# Patient Record
Sex: Male | Born: 1965
Health system: Southern US, Community
[De-identification: ages and names within clinical notes are randomized; demographics above are authoritative.]

## PROBLEM LIST (undated history)

## (undated) DIAGNOSIS — F419 Anxiety disorder, unspecified: Secondary | ICD-10-CM

## (undated) DIAGNOSIS — C61 Malignant neoplasm of prostate: Secondary | ICD-10-CM

## (undated) DIAGNOSIS — G35 Multiple sclerosis: Secondary | ICD-10-CM

## (undated) DIAGNOSIS — R7303 Prediabetes: Secondary | ICD-10-CM

## (undated) DIAGNOSIS — N320 Bladder-neck obstruction: Secondary | ICD-10-CM

## (undated) DIAGNOSIS — N390 Urinary tract infection, site not specified: Secondary | ICD-10-CM

## (undated) DIAGNOSIS — N179 Acute kidney failure, unspecified: Secondary | ICD-10-CM

## (undated) DIAGNOSIS — R972 Elevated prostate specific antigen [PSA]: Secondary | ICD-10-CM

## (undated) DIAGNOSIS — T191XXA Foreign body in bladder, initial encounter: Secondary | ICD-10-CM

## (undated) DIAGNOSIS — C9110 Chronic lymphocytic leukemia of B-cell type not having achieved remission: Secondary | ICD-10-CM

## (undated) HISTORY — DX: Multiple sclerosis: G35

## (undated) HISTORY — DX: Elevated prostate specific antigen (PSA): R97.20

## (undated) HISTORY — DX: Malignant neoplasm of prostate: C61

---

## 2000-01-07 HISTORY — PX: KNEE SURGERY: SHX244

## 2000-02-19 ENCOUNTER — Ambulatory Visit (HOSPITAL_COMMUNITY): Admission: RE | Admit: 2000-02-19 | Discharge: 2000-02-19 | Payer: Self-pay | Admitting: *Deleted

## 2007-04-11 ENCOUNTER — Emergency Department: Payer: Self-pay | Admitting: Emergency Medicine

## 2011-01-07 DIAGNOSIS — C61 Malignant neoplasm of prostate: Secondary | ICD-10-CM

## 2011-01-07 HISTORY — DX: Malignant neoplasm of prostate: C61

## 2016-06-26 DIAGNOSIS — M549 Dorsalgia, unspecified: Secondary | ICD-10-CM | POA: Diagnosis not present

## 2016-06-26 DIAGNOSIS — M545 Low back pain: Secondary | ICD-10-CM | POA: Diagnosis not present

## 2016-06-26 DIAGNOSIS — M47816 Spondylosis without myelopathy or radiculopathy, lumbar region: Secondary | ICD-10-CM | POA: Diagnosis not present

## 2016-06-26 DIAGNOSIS — M5136 Other intervertebral disc degeneration, lumbar region: Secondary | ICD-10-CM | POA: Diagnosis not present

## 2016-07-11 ENCOUNTER — Encounter: Payer: Self-pay | Admitting: Family Medicine

## 2016-07-11 ENCOUNTER — Ambulatory Visit (INDEPENDENT_AMBULATORY_CARE_PROVIDER_SITE_OTHER): Payer: Federal, State, Local not specified - PPO | Admitting: Family Medicine

## 2016-07-11 VITALS — BP 111/82 | HR 66 | Temp 98.4°F | Resp 16 | Ht 71.0 in | Wt 251.0 lb

## 2016-07-11 DIAGNOSIS — E782 Mixed hyperlipidemia: Secondary | ICD-10-CM | POA: Diagnosis not present

## 2016-07-11 DIAGNOSIS — R972 Elevated prostate specific antigen [PSA]: Secondary | ICD-10-CM

## 2016-07-11 DIAGNOSIS — G35 Multiple sclerosis: Secondary | ICD-10-CM

## 2016-07-11 DIAGNOSIS — Z1211 Encounter for screening for malignant neoplasm of colon: Secondary | ICD-10-CM | POA: Diagnosis not present

## 2016-07-11 DIAGNOSIS — R7309 Other abnormal glucose: Secondary | ICD-10-CM | POA: Diagnosis not present

## 2016-07-11 DIAGNOSIS — C61 Malignant neoplasm of prostate: Secondary | ICD-10-CM | POA: Diagnosis not present

## 2016-07-11 DIAGNOSIS — Z7689 Persons encountering health services in other specified circumstances: Secondary | ICD-10-CM | POA: Diagnosis not present

## 2016-07-11 LAB — LIPID PANEL
CHOL/HDL RATIO: 4.5 ratio (ref <5.0)
CHOLESTEROL: 232 mg/dL — AB (ref <200)
HDL: 52 mg/dL (ref >40)
LDL Cholesterol: 161 mg/dL — ABNORMAL HIGH (ref <100)
Triglycerides: 94 mg/dL (ref <150)
VLDL: 19 mg/dL (ref <30)

## 2016-07-11 LAB — COMPLETE METABOLIC PANEL WITH GFR
ALT: 21 U/L (ref 9–46)
AST: 17 U/L (ref 10–35)
Albumin: 4.4 g/dL (ref 3.6–5.1)
Alkaline Phosphatase: 70 U/L (ref 40–115)
BILIRUBIN TOTAL: 0.7 mg/dL (ref 0.2–1.2)
BUN: 11 mg/dL (ref 7–25)
CHLORIDE: 105 mmol/L (ref 98–110)
CO2: 24 mmol/L (ref 20–31)
Calcium: 9.3 mg/dL (ref 8.6–10.3)
Creat: 1.04 mg/dL (ref 0.70–1.33)
GFR, EST NON AFRICAN AMERICAN: 83 mL/min (ref >=60)
GFR, Est African American: >89 mL/min (ref >=60)
Glucose, Bld: 93 mg/dL (ref 65–99)
Potassium: 4.2 mmol/L (ref 3.5–5.3)
Sodium: 138 mmol/L (ref 135–146)
TOTAL PROTEIN: 7.2 g/dL (ref 6.1–8.1)

## 2016-07-11 NOTE — Assessment & Plan Note (Signed)
Currently asymptomatic with known low grade prostate cancer, lost to follow-up for past 2 years - Previously established with [facility] Dr [name], last visit 01/2014 - Prior PSA trend with elevated PSA and s/p biopsy dx in 2013  Plan: 1. Strongly recommend he return to his [facility]/Uro Dr [name] to resume prostate cancer monitoring and management - no new referral at this time since established, he is to call to arrange follow-up, may need MRI as requested of prostate, will defer repeat PSA today. If he needs new referral should call our office

## 2016-07-11 NOTE — Assessment & Plan Note (Signed)
Secondary to prostate cancer No repeat PSA today Return to [facility] Onc/Uro

## 2016-07-11 NOTE — Assessment & Plan Note (Signed)
Stable chronic problem, without any recent flare or any symptoms currently. Previously diagnosed in 2002 following visual impairment, confirmed by report from Neurology with MRI and Lumbar Puncture, previously established with [facility], lost to follow-up now >9 years No meds currently  Plan: 1. Referral to Neurology - [facility], to re-establish care for chronic MS, may need updated diagnostics and prior review, determine treatment plan if any at this time, do not have any available records to review 2. Follow-up PRN

## 2016-07-11 NOTE — Progress Notes (Signed)
Subjective:    Patient ID: Ralph [name], male    DOB: 06/26/1965, 51 y.o.   MRN: [id number]  Ralph [name] is a 51 y.o. male presenting on 07/11/2016 for Establish Care  Previously seen by Dr Luan Pulling here at [facility] last 2014. Did not have PCP in between. Here to re-establish.  HPI   Multiple Sclerosis (MS), unspecified type (unable to recall exact diagnosis) - Reports initial concerns dating back to 2002 with blurry vision as first symptoms, saw optometrist, and was referred to Neurology (established with [facility]), and he had MRI and Lumbar Puncture with CSF analysis, he was diagnosed with MS, treated with steroid shots and neurologist every year for 7-8 years at that time, had detailed motor skill testing. His neurologist moved to [place], and he has not re-established since, last visit >9 years ago. - He remains off medication for >9 years, and remains symptom free during that time, still has no new concerns at this time, but would like to resume routine check-up on MS. He would like referral to return to [place].  Prostate Cancer / Elevated PSA: - Reports back in 2013, had initial elevated PSA, he established with [facility], had prostate biopsy in 2013, with low grade carcinoma, then followed PSA. Last office visit with Onc was 01/2014, was advised to follow-up in 6 months and repeat MRI to determine if need another biopsy, he did not follow-up due to no particular reason - He wants to re-establish with [facility] - No family history of Prostate CA - Denies any new concerns with prostate today, no new symptoms  Additional PMH - reports prior abnormal glucose in past was told borderline elevated does not recall results, also was told had some abnormal cholesterol numbers in past as well, never on medications for these problems.  Health Maintenance: - Screening Colon Cancer: He had a diagnostic colonoscopy for GI problems in [place] >4-5 years ago, was  normal, did not have polyps, was told to return after >age 27 for screening. No known family history of colon cancer. Considering Cologuard screening.   Past Medical History:  Diagnosis Date  . Elevated PSA   . MS (multiple sclerosis) ([place])   . Prostate cancer ([place]) 2013   Past Surgical History:  Procedure Laterality Date  . KNEE SURGERY  2002   meniscus   Social History   Social History  . Marital status: Single    Spouse name: N/A  . Number of children: N/A  . Years of education: College   Occupational History  . [employer])    Social History Main Topics  . Smoking status: Never Smoker  . Smokeless tobacco: Never Used  . Alcohol use No     Comment: Former alcohol consumption  . Drug use: No  . Sexual activity: No   Other Topics Concern  . Not on file   Social History Narrative  . No narrative on file   Family History  Problem Relation Age of Onset  . Cancer Sister        breast CA    No current outpatient prescriptions on file prior to visit.   No current facility-administered medications on file prior to visit.     Review of Systems  Constitutional: Negative for activity change, appetite change, chills, diaphoresis, fatigue, fever and unexpected weight change.  HENT: Negative for congestion, hearing loss and sinus pressure.   Eyes: Negative for visual disturbance.  Respiratory: Negative for apnea, cough,  choking, chest tightness, shortness of breath and wheezing.   Cardiovascular: Negative for chest pain, palpitations and leg swelling.  Gastrointestinal: Negative for abdominal pain, anal bleeding, blood in stool, constipation, diarrhea, nausea and vomiting.  Endocrine: Negative for cold intolerance and polyuria.  Genitourinary: Negative for decreased urine volume, difficulty urinating, dysuria, frequency and hematuria.  Musculoskeletal: Negative for arthralgias, back pain, gait problem, myalgias and neck pain.  Skin: Negative for rash.    Allergic/Immunologic: Negative for environmental allergies.  Neurological: Negative for dizziness, weakness, light-headedness, numbness and headaches.  Hematological: Negative for adenopathy.  Psychiatric/Behavioral: Negative for behavioral problems, dysphoric mood and sleep disturbance. The patient is not nervous/anxious.    Per HPI unless specifically indicated above     Objective:    BP 111/82   Pulse 66   Temp 98.4 F (36.9 C) (Oral)   Resp 16   Ht 5\' 11"  (1.803 m)   Wt 251 lb (113.9 kg)   BMI 35.01 kg/m   Wt Readings from Last 3 Encounters:  07/11/16 251 lb (113.9 kg)    Physical Exam  Constitutional: He is oriented to person, place, and time. He appears well-developed and well-nourished. No distress.  Well-appearing, comfortable, cooperative  HENT:  Head: Normocephalic and atraumatic.  Mouth/Throat: Oropharynx is clear and moist.  Eyes: Conjunctivae and EOM are normal. Pupils are equal, round, and reactive to light. Right eye exhibits no discharge. Left eye exhibits no discharge.  Neck: Normal range of motion. Neck supple. No thyromegaly present.  Cardiovascular: Normal rate, regular rhythm, normal heart sounds and intact distal pulses.   No murmur heard. Pulmonary/Chest: Effort normal and breath sounds normal. No respiratory distress. He has no wheezes. He has no rales.  Abdominal: Soft. Bowel sounds are normal. He exhibits no distension and no mass. There is no tenderness.  Genitourinary:  Genitourinary Comments: Deferred DRE today  Musculoskeletal: Normal range of motion. He exhibits no edema or tenderness.  Upper / Lower Extremities: - Normal muscle tone, strength bilateral upper extremities 5/5, lower extremities 5/5  Lymphadenopathy:    He has no cervical adenopathy.  Neurological: He is alert and oriented to person, place, and time.  Distal sensation intact to light touch all extremities  Skin: Skin is warm and dry. No rash noted. He is not diaphoretic. No  erythema.  Psychiatric: He has a normal mood and affect. His behavior is normal.  Well groomed, good eye contact, normal speech and thoughts  Nursing note and vitals reviewed.  No results found for this or any previous visit.    Assessment & Plan:   Problem List Items Addressed This Visit    Screening for colon cancer    Due for routine colon cancer screening. Prior diagnostic colonoscopy approx age 81 (>6 years ago) due to GI symptoms, reportedly no polyps, was performed locally, and advised return >age 15 for screening - No family history colon cancer. - Discussion today about recommendations for either Colonoscopy or Cologuard screening, benefits and risks of screening, interested in Cologuard, understands that if positive then recommendation is for diagnostic colonoscopy to follow-up.  - Patient advised to contact insurance first to learn cost, will notify us when ready for [place] to order Cologuard      Prostate cancer [facility]) - Primary    Currently asymptomatic with known low grade prostate cancer, lost to follow-up for past 2 years - Previously established with [facility] Dr [name], last visit 01/2014 - Prior PSA trend with elevated PSA and s/p biopsy dx  in 2013  Plan: 1. Strongly recommend he return to his [facility] Onc/Uro Dr [name] to resume prostate cancer monitoring and management - no new referral at this time since established, he is to call to arrange follow-up, may need MRI as requested of prostate, will defer repeat PSA today. If he needs new referral should call our office      Relevant Orders   CBC with Differential/Platelet   MS (multiple sclerosis) ([place])    Stable chronic problem, without any recent flare or any symptoms currently. Previously diagnosed in 2002 following visual impairment, confirmed by report from Neurology with MRI and Lumbar Puncture, previously established with [facility], lost to follow-up now >9 years No meds currently  Plan: 1. Referral to  Neurology - [facility], to re-establish care for chronic MS, may need updated diagnostics and prior review, determine treatment plan if any at this time, do not have any available records to review 2. Follow-up PRN      Relevant Orders   Ambulatory referral to Neurology   COMPLETE METABOLIC PANEL WITH GFR   CBC with Differential/Platelet   Elevated PSA    Secondary to prostate cancer No repeat PSA today Return to [facility]       Other Visit Diagnoses    Encounter to establish care with new doctor       Relevant Orders   CBC with Differential/Platelet   Abnormal glucose       Will check chemistry and A1c for routine monitoring, follow-up as appropriate, sooner if A1c elevated, discuss results   Relevant Orders   Hemoglobin A1c   Mixed hyperlipidemia       Will order fasting labs today, check lipids, no results for comparison, discuss results as appropriate   Relevant Orders   Lipid panel      No orders of the defined types were placed in this encounter.   Follow up plan: Return in about 1 year (around 07/11/2017) for Annual Physical.  Nobie Putnam, DO [facility] 07/11/2016, 12:48 PM

## 2016-07-11 NOTE — Patient Instructions (Addendum)
Thank you for coming to the clinic today.  1.  Please contact [facility] / Urology - Dr Zoe Lan office to see if you can re-schedule apt or if you require a NEW patient referral, call us for referral order.  Last appointment 01/09/2014 - was advised to do 6 month follow-up and MRI  Other option is we can refer you locally to [facility] and start over, would need records released from [place]  [place], [place] [address]  [phone]  [facility], [place]  [facility]  [id number]  [place], [place] [address]  [phone]  [phone] (Fax)     2. If you don't hear back in 2 weeks, call them to check status  [facility]   Address: [address], [place], [place] [address] Hours: 8AM-5PM Phone: [phone]  Colon Cancer Screening: - For all adults age 71+ routine colon cancer screening is highly recommended.     - Recent guidelines from [employer] recommend starting age of 68 - Early detection of colon cancer is important, because often there are no warning signs or symptoms, also if found early usually it can be cured. Late stage is hard to treat.  - If you are not interested in Colonoscopy screening (if done and normal you could be cleared for 5 to 10 years until next due), then Cologuard is an excellent alternative for screening test for Colon Cancer. It is highly sensitive for detecting DNA of colon cancer from even the earliest stages. Also, there is NO bowel prep required. - If Cologuard is NEGATIVE, then it is good for 3 years before next due - If Cologuard is POSITIVE, then it is strongly advised to get a Colonoscopy, which allows the GI doctor to locate the source of the cancer or polyp (even very early stage) and treat it by removing it. ------------------------- If you would like to proceed with  Cologuard (stool DNA test) - FIRST, call your insurance company and tell them you want to check cost of Cologuard tell them CPT Code [phone] (it may be completely covered and you could get for no cost, OR max cost without any coverage is about $600). Also, keep in mind if you do NOT open the kit, and decide not to do the test, you will NOT be charged, you should contact the company if you decide not to do the test. - If you want to proceed, you can notify us (phone message, [place], or at next visit) and we will order it for you. The test kit will be delivered to you house within about 1 week. Follow instructions to collect sample, you may call the company for any help or questions, 24/7 telephone support at [phone].   Please schedule a Follow-up Appointment to: Return in about 1 year (around 07/11/2017) for Annual Physical.  If you have any other questions or concerns, please feel free to call the clinic or send a message through [place]. You may also schedule an earlier appointment if necessary.  Additionally, you may be receiving a survey about your experience at our clinic within a few days to 1 week by e-mail or mail. We value your feedback.  Nobie Putnam, DO [place]

## 2016-07-11 NOTE — Assessment & Plan Note (Signed)
Due for routine colon cancer screening. Prior diagnostic colonoscopy approx age 51 (>6 years ago) due to GI symptoms, reportedly no polyps, was performed locally, and advised return >age 95 for screening - No family history colon cancer. - Discussion today about recommendations for either Colonoscopy or Cologuard screening, benefits and risks of screening, interested in Cologuard, understands that if positive then recommendation is for diagnostic colonoscopy to follow-up.  - Patient advised to contact insurance first to learn cost, will notify us when ready for [place] to order Cologuard

## 2016-07-12 LAB — CBC WITH DIFFERENTIAL/PLATELET
Basophils Absolute: 0 {cells}/uL (ref 0–200)
Basophils Relative: 0 %
Eosinophils Absolute: 156 {cells}/uL (ref 15–500)
Eosinophils Relative: 1 %
HCT: 43.2 % (ref 38.5–50.0)
Hemoglobin: 14.3 g/dL (ref 13.2–17.1)
Lymphocytes Relative: 75 %
Lymphs Abs: 11700 {cells}/uL — ABNORMAL HIGH (ref 850–3900)
MCH: 28.3 pg (ref 27.0–33.0)
MCHC: 33.1 g/dL (ref 32.0–36.0)
MCV: 85.4 fL (ref 80.0–100.0)
MPV: 9.8 fL (ref 7.5–12.5)
Monocytes Absolute: 468 {cells}/uL (ref 200–950)
Monocytes Relative: 3 %
Neutro Abs: 3276 {cells}/uL (ref 1500–7800)
Neutrophils Relative %: 21 %
Platelets: 244 K/uL (ref 140–400)
RBC: 5.06 MIL/uL (ref 4.20–5.80)
RDW: 14.8 % (ref 11.0–15.0)
WBC: 15.6 K/uL — ABNORMAL HIGH (ref 3.8–10.8)

## 2016-07-12 LAB — HEMOGLOBIN A1C
Hgb A1c MFr Bld: 5.8 % — ABNORMAL HIGH (ref <5.7)
Mean Plasma Glucose: 120 mg/dL

## 2016-07-14 LAB — PATHOLOGIST SMEAR REVIEW

## 2016-07-15 ENCOUNTER — Other Ambulatory Visit: Payer: Self-pay | Admitting: Family Medicine

## 2016-07-15 DIAGNOSIS — E785 Hyperlipidemia, unspecified: Secondary | ICD-10-CM | POA: Insufficient documentation

## 2016-07-15 DIAGNOSIS — G35 Multiple sclerosis: Secondary | ICD-10-CM

## 2016-07-15 DIAGNOSIS — Z0000 Encounter for general adult medical examination without abnormal findings: Secondary | ICD-10-CM

## 2016-07-15 DIAGNOSIS — D72820 Lymphocytosis (symptomatic): Secondary | ICD-10-CM | POA: Insufficient documentation

## 2016-07-15 DIAGNOSIS — R7303 Prediabetes: Secondary | ICD-10-CM | POA: Insufficient documentation

## 2016-07-15 DIAGNOSIS — E7800 Pure hypercholesterolemia, unspecified: Secondary | ICD-10-CM

## 2016-07-16 ENCOUNTER — Telehealth: Payer: Self-pay | Admitting: Family Medicine

## 2016-07-16 DIAGNOSIS — E7800 Pure hypercholesterolemia, unspecified: Secondary | ICD-10-CM

## 2016-07-16 MED ORDER — ROSUVASTATIN CALCIUM 5 MG PO TABS: 5.0000 mg | ORAL_TABLET | Freq: Every day | ORAL | 5 refills | Status: DC

## 2016-07-16 NOTE — Telephone Encounter (Signed)
I contacted patient today 07/16/16 to review lab results, spoke with patient briefly on mobile # and reviewed most of abnormal lab results.  1. Chemistry - Normal results, including electrolytes, kidney and liver function. Blood sugar normal.  2. Hemoglobin A1c 5.8 - abnormal, consistent with new Pre-Diabetes - recommended lifestyle improvements, follow-up in 6 months to re-check A1c  3. Cholesterol - Abnormal cholesterol results. Elevated 161 LDL (bad cholesterol). Previously on statin, intolerance - Recommend restart low dose statin, intermittent dosing rosuvastatin x 1 weekly then increase gradually to twice week, then 3 times weekly, goal for every other day if tolerated, then can consider daily - prefer night time dosing, new rx sent  4. CBC Blood Counts - No quantifiable anemia. Normal Hemoglobin. Abnormal WBC 15.6, abnormal absolute lymph count 11,700 - path smear review showed atypical lymphocytes, suggested immunophenotyping by flow cytometry, and concern for RBCs to be microcytic and hypochromic, consider iron deficiency eval. - Discussed this result with patient, do not have prior CBC to compare to, concern that needs further evaluation and repeat testing as recommended, and iron studies. Given patient already established with [facility] through Urology for monitoring Prostate Cancer, I contacted Dr [name] and spoke with him today 07/16/16, he agrees for helping connect patient with [facility], and they will reach out to scheduling to arrange him as new patient, awaiting to hear back on potential apt or scheduling at this time. Patient aware of this and that he needs further testing.  Follow-up with me in 6 months for Pre-Diabetes, A1c, and discuss statin / cholesterol. Keep apt scheduled for 07/2017 for Annual Physical.  Nobie Putnam, DO [facility] 07/16/2016, 5:30 PM

## 2016-07-30 ENCOUNTER — Telehealth: Payer: Self-pay | Admitting: Family Medicine

## 2016-07-30 NOTE — Telephone Encounter (Signed)
Reference to previous abnormal CBC lab result, copied below is text from our last conversation 7/11 "CBC Blood Counts - No quantifiable anemia. Normal Hemoglobin. Abnormal WBC 15.6, abnormal absolute lymph count 11,700 - path smear review showed atypical lymphocytes, suggested immunophenotyping by flow cytometry, and concern for RBCs to be microcytic and hypochromic, consider iron deficiency eval. - Discussed this result with patient, do not have prior CBC to compare to, concern that needs further evaluation and repeat testing as recommended, and iron studies. Given patient already established with [facility] through Urology for monitoring Prostate Cancer, I contacted Dr [name] and spoke with him today 07/16/16, he agrees for helping connect patient with [facility], and they will reach out to scheduling to arrange him as new patient, awaiting to hear back on potential apt or scheduling at this time. Patient aware of this and that he needs further testing."  I called patient today to check status of this over 2 weeks later. He states that [facility] never called him back to arrange or schedule. He has contacted Dr [name], no further updates plans to follow-up. Also he has apt with [facility] Dr Jannifer Franklin for MS.  I advised him that he may be able to contact [facility] Heme[facility]On[facility] himself, since he is already connected to their office through Urology for prostate cancer, and they should have been working on the referral already.  He will call today or tomorrow to check status. If he does need a new referral he will let me know.  [facility]  [place], [place] [address]  [place], [facility] 07/30/2016, 11:59 AM

## 2016-08-29 DIAGNOSIS — Z6835 Body mass index (BMI) 35.0-35.9, adult: Secondary | ICD-10-CM | POA: Diagnosis not present

## 2016-08-29 DIAGNOSIS — C61 Malignant neoplasm of prostate: Secondary | ICD-10-CM | POA: Diagnosis not present

## 2016-09-09 ENCOUNTER — Encounter: Payer: Self-pay | Admitting: Neurology

## 2016-09-09 ENCOUNTER — Ambulatory Visit (INDEPENDENT_AMBULATORY_CARE_PROVIDER_SITE_OTHER): Payer: Federal, State, Local not specified - PPO | Admitting: Neurology

## 2016-09-09 VITALS — BP 164/93 | HR 108 | Ht 71.0 in | Wt 250.5 lb

## 2016-09-09 DIAGNOSIS — G35 Multiple sclerosis: Secondary | ICD-10-CM | POA: Diagnosis not present

## 2016-09-09 NOTE — Patient Instructions (Signed)
   We will check MRI of the brain and cervical spine. 

## 2016-09-09 NOTE — Progress Notes (Signed)
Reason for visit: Multiple sclerosis  Referring physician: Dr. Meredith Pel [name] is a 51 y.o. male  History of present illness:  Ralph [name] is a 51 year old right-handed gentleman with a history of multiple sclerosis that apparently was diagnosed in 2002, the patient initially was followed by Dr. Rosiland Oz. The patient was placed on what he believes was Betaseron, he took the medication for several years, but he has been off of all disease modifying agents for 9 or 10 years. Initial symptoms included a visual change, the patient did undergo MRI of the brain and a lumbar puncture that helped confirm the diagnosis. Since the initial MS attack, the patient has not had any new symptoms of numbness, weakness, vision changes, balance changes, or difficulty controlling the bowels or the bladder. He does have a history of prostate cancer. He was recent seen by his primary care physician, he was referred to this office for follow-up for the multiple sclerosis.  Past Medical History:  Diagnosis Date  . Elevated PSA   . MS (multiple sclerosis) ([place])   . Prostate cancer ([place]) 2013    Past Surgical History:  Procedure Laterality Date  . KNEE SURGERY  2002   meniscus    Family History  Problem Relation Age of Onset  . Cancer Sister        breast CA     Social history:  reports that he has never smoked. He has never used smokeless tobacco. He reports that he does not drink alcohol or use drugs.  Medications:  Prior to Admission medications   Medication Sig Start Date End Date Taking? Authorizing Provider  rosuvastatin (CRESTOR) 5 MG tablet Take 1 tablet (5 mg total) by mouth at bedtime. Start with 1 pill weekly, gradual increase to 1 every other day, then nightly 07/16/16  Yes Karamalegos, Devonne Doughty, DO     No Known Allergies  ROS:  Out of a complete 14 system review of symptoms, the patient complains only of the following symptoms, and all other reviewed systems are  negative.  Ringing in the ears  Blood pressure (!) 164/93, pulse (!) 108, height 5\' 11"  (1.803 m), weight 250 lb 8 oz (113.6 kg).  Physical Exam  General: The patient is alert and cooperative at the time of the examination.  Eyes: Pupils are equal, round, and reactive to light. Discs are flat bilaterally.  Neck: The neck is supple, no carotid bruits are noted.  Respiratory: The respiratory examination is clear.  Cardiovascular: The cardiovascular examination reveals a regular rate and rhythm, no obvious murmurs or rubs are noted.  Skin: Extremities are without significant edema.  Neurologic Exam  Mental status: The patient is alert and oriented x 3 at the time of the examination. The patient has apparent normal recent and remote memory, with an apparently normal attention span and concentration ability.  Cranial nerves: Facial symmetry is present. There is good sensation of the face to pinprick and soft touch bilaterally. The strength of the facial muscles and the muscles to head turning and shoulder shrug are normal bilaterally. Speech is well enunciated, no aphasia or dysarthria is noted. Extraocular movements are full. Visual fields are full. The tongue is midline, and the patient has symmetric elevation of the soft palate. No obvious hearing deficits are noted.  Motor: The motor testing reveals 5 over 5 strength of all 4 extremities. Good symmetric motor tone is noted throughout.  Sensory: Sensory testing is intact to pinprick, soft touch, vibration sensation,  and position sense on all 4 extremities. No evidence of extinction is noted.  Coordination: Cerebellar testing reveals good finger-nose-finger and heel-to-shin bilaterally.  Gait and station: Gait is normal. Tandem gait is normal. Romberg is negative. No drift is seen.  Reflexes: Deep tendon reflexes are symmetric and normal bilaterally. Toes are downgoing bilaterally.   Assessment/Plan:  1. History of multiple  sclerosis  The patient will be set up for MRI of the brain and cervical spine with and without gadolinium enhancement. He has had recent blood work done confirming normal renal function. He will follow-up in 6 months, if he continues to do well and the MRI shows a low plaque burden, the patient may remain off of disease modifying agents for now. We will check MRI evaluations intermittently.  Jill Alexanders MD 09/09/2016 3:13 PM  [facility] [address] [place], [place] [address]  Phone [phone] Fax [phone]

## 2016-09-11 DIAGNOSIS — N400 Enlarged prostate without lower urinary tract symptoms: Secondary | ICD-10-CM | POA: Diagnosis not present

## 2016-09-11 DIAGNOSIS — C61 Malignant neoplasm of prostate: Secondary | ICD-10-CM | POA: Diagnosis not present

## 2016-09-11 DIAGNOSIS — R9721 Rising PSA following treatment for malignant neoplasm of prostate: Secondary | ICD-10-CM | POA: Diagnosis not present

## 2017-01-29 ENCOUNTER — Ambulatory Visit (INDEPENDENT_AMBULATORY_CARE_PROVIDER_SITE_OTHER): Payer: Federal, State, Local not specified - PPO

## 2017-01-29 DIAGNOSIS — Z23 Encounter for immunization: Secondary | ICD-10-CM

## 2017-02-20 DIAGNOSIS — R9721 Rising PSA following treatment for malignant neoplasm of prostate: Secondary | ICD-10-CM | POA: Diagnosis not present

## 2017-02-20 DIAGNOSIS — C61 Malignant neoplasm of prostate: Secondary | ICD-10-CM | POA: Diagnosis not present

## 2017-02-20 DIAGNOSIS — R972 Elevated prostate specific antigen [PSA]: Secondary | ICD-10-CM | POA: Diagnosis not present

## 2017-03-09 ENCOUNTER — Ambulatory Visit: Payer: Federal, State, Local not specified - PPO | Admitting: Nurse Practitioner

## 2017-06-30 ENCOUNTER — Ambulatory Visit (INDEPENDENT_AMBULATORY_CARE_PROVIDER_SITE_OTHER): Payer: Federal, State, Local not specified - PPO

## 2017-06-30 DIAGNOSIS — Z23 Encounter for immunization: Secondary | ICD-10-CM

## 2017-07-15 ENCOUNTER — Other Ambulatory Visit: Payer: PRIVATE HEALTH INSURANCE

## 2017-07-21 ENCOUNTER — Encounter: Payer: PRIVATE HEALTH INSURANCE | Admitting: Family Medicine

## 2018-01-13 DIAGNOSIS — M545 Low back pain: Secondary | ICD-10-CM | POA: Diagnosis not present

## 2018-01-13 DIAGNOSIS — M47816 Spondylosis without myelopathy or radiculopathy, lumbar region: Secondary | ICD-10-CM | POA: Diagnosis not present

## 2019-09-09 DIAGNOSIS — K080 Exfoliation of teeth due to systemic causes: Secondary | ICD-10-CM | POA: Diagnosis not present

## 2021-01-21 ENCOUNTER — Inpatient Hospital Stay
Admission: EM | Admit: 2021-01-21 | Discharge: 2021-01-24 | DRG: 683 | Disposition: A | Discharge status: Home / Self Care | Payer: Federal, State, Local not specified - PPO | Attending: Internal Medicine | Admitting: Internal Medicine

## 2021-01-21 ENCOUNTER — Emergency Department: Payer: Federal, State, Local not specified - PPO

## 2021-01-21 ENCOUNTER — Other Ambulatory Visit: Payer: Self-pay

## 2021-01-21 DIAGNOSIS — N136 Pyonephrosis: Secondary | ICD-10-CM | POA: Diagnosis present

## 2021-01-21 DIAGNOSIS — N320 Bladder-neck obstruction: Secondary | ICD-10-CM | POA: Diagnosis present

## 2021-01-21 DIAGNOSIS — R972 Elevated prostate specific antigen [PSA]: Secondary | ICD-10-CM | POA: Diagnosis not present

## 2021-01-21 DIAGNOSIS — E7800 Pure hypercholesterolemia, unspecified: Secondary | ICD-10-CM

## 2021-01-21 DIAGNOSIS — R319 Hematuria, unspecified: Secondary | ICD-10-CM | POA: Diagnosis present

## 2021-01-21 DIAGNOSIS — R350 Frequency of micturition: Secondary | ICD-10-CM | POA: Diagnosis present

## 2021-01-21 DIAGNOSIS — C61 Malignant neoplasm of prostate: Secondary | ICD-10-CM | POA: Diagnosis present

## 2021-01-21 DIAGNOSIS — Z23 Encounter for immunization: Secondary | ICD-10-CM | POA: Diagnosis not present

## 2021-01-21 DIAGNOSIS — R339 Retention of urine, unspecified: Secondary | ICD-10-CM | POA: Diagnosis not present

## 2021-01-21 DIAGNOSIS — E785 Hyperlipidemia, unspecified: Secondary | ICD-10-CM | POA: Diagnosis present

## 2021-01-21 DIAGNOSIS — Z803 Family history of malignant neoplasm of breast: Secondary | ICD-10-CM

## 2021-01-21 DIAGNOSIS — Z20822 Contact with and (suspected) exposure to covid-19: Secondary | ICD-10-CM | POA: Diagnosis present

## 2021-01-21 DIAGNOSIS — G35 Multiple sclerosis: Secondary | ICD-10-CM | POA: Diagnosis present

## 2021-01-21 DIAGNOSIS — D72820 Lymphocytosis (symptomatic): Secondary | ICD-10-CM | POA: Diagnosis present

## 2021-01-21 DIAGNOSIS — R7303 Prediabetes: Secondary | ICD-10-CM | POA: Diagnosis present

## 2021-01-21 DIAGNOSIS — D72829 Elevated white blood cell count, unspecified: Secondary | ICD-10-CM | POA: Diagnosis present

## 2021-01-21 DIAGNOSIS — N139 Obstructive and reflux uropathy, unspecified: Secondary | ICD-10-CM

## 2021-01-21 DIAGNOSIS — N1330 Unspecified hydronephrosis: Secondary | ICD-10-CM | POA: Diagnosis not present

## 2021-01-21 DIAGNOSIS — N401 Enlarged prostate with lower urinary tract symptoms: Secondary | ICD-10-CM | POA: Diagnosis present

## 2021-01-21 DIAGNOSIS — N138 Other obstructive and reflux uropathy: Secondary | ICD-10-CM | POA: Diagnosis present

## 2021-01-21 DIAGNOSIS — N179 Acute kidney failure, unspecified: Principal | ICD-10-CM

## 2021-01-21 DIAGNOSIS — R338 Other retention of urine: Secondary | ICD-10-CM | POA: Diagnosis present

## 2021-01-21 LAB — CBG MONITORING, ED: Glucose-Capillary: 77 mg/dL (ref 70–99)

## 2021-01-21 LAB — URINALYSIS, ROUTINE W REFLEX MICROSCOPIC
Bacteria, UA: NONE SEEN
Bilirubin Urine: NEGATIVE
Glucose, UA: NEGATIVE mg/dL
Ketones, ur: NEGATIVE mg/dL
Leukocytes,Ua: NEGATIVE
Nitrite: NEGATIVE
Protein, ur: NEGATIVE mg/dL
Specific Gravity, Urine: 1.01 (ref 1.005–1.030)
Squamous Epithelial / LPF: NONE SEEN (ref 0–5)
pH: 5.5 (ref 5.0–8.0)

## 2021-01-21 LAB — BASIC METABOLIC PANEL
Anion gap: 11 (ref 5–15)
BUN: 43 mg/dL — ABNORMAL HIGH (ref 6–20)
CO2: 25 mmol/L (ref 22–32)
Calcium: 9.3 mg/dL (ref 8.9–10.3)
Chloride: 105 mmol/L (ref 98–111)
Creatinine, Ser: 5.72 mg/dL — ABNORMAL HIGH (ref 0.61–1.24)
GFR, Estimated: 11 mL/min — ABNORMAL LOW (ref >60)
Glucose, Bld: 86 mg/dL (ref 70–99)
Potassium: 4.7 mmol/L (ref 3.5–5.1)
Sodium: 141 mmol/L (ref 135–145)

## 2021-01-21 LAB — RESP PANEL BY RT-PCR (FLU A&B, COVID) ARPGX2
Influenza A by PCR: NEGATIVE
Influenza B by PCR: NEGATIVE
SARS Coronavirus 2 by RT PCR: NEGATIVE

## 2021-01-21 LAB — CBC
HCT: 38.8 % — ABNORMAL LOW (ref 39.0–52.0)
Hemoglobin: 12.7 g/dL — ABNORMAL LOW (ref 13.0–17.0)
MCH: 28.2 pg (ref 26.0–34.0)
MCHC: 32.7 g/dL (ref 30.0–36.0)
MCV: 86 fL (ref 80.0–100.0)
Platelets: 248 10*3/uL (ref 150–400)
RBC: 4.51 MIL/uL (ref 4.22–5.81)
RDW: 14.2 % (ref 11.5–15.5)
WBC: 29.5 10*3/uL — ABNORMAL HIGH (ref 4.0–10.5)
nRBC: 0 % (ref 0.0–0.2)

## 2021-01-21 MED ORDER — HEPARIN SODIUM (PORCINE) 5000 UNIT/ML IJ SOLN
5000.0000 [IU] | Freq: Three times a day (TID) | INTRAMUSCULAR | Status: DC
  Administered 2021-01-21 – 2021-01-24 (×8): 5000 [IU] via SUBCUTANEOUS
  Filled 2021-01-21 (×8): qty 1

## 2021-01-21 MED ORDER — ONDANSETRON HCL 4 MG/2ML IJ SOLN: 4.0000 mg | Freq: Four times a day (QID) | INTRAMUSCULAR | Status: DC | PRN

## 2021-01-21 MED ORDER — SENNOSIDES-DOCUSATE SODIUM 8.6-50 MG PO TABS: 1.0000 | ORAL_TABLET | Freq: Every evening | ORAL | Status: DC | PRN

## 2021-01-21 MED ORDER — TAMSULOSIN HCL 0.4 MG PO CAPS
0.4000 mg | ORAL_CAPSULE | Freq: Every day | ORAL | Status: DC
  Administered 2021-01-21 – 2021-01-24 (×4): 0.4 mg via ORAL
  Filled 2021-01-21 (×4): qty 1

## 2021-01-21 MED ORDER — INFLUENZA VAC SPLIT QUAD 0.5 ML IM SUSY
0.5000 mL | PREFILLED_SYRINGE | INTRAMUSCULAR | Status: AC
  Administered 2021-01-22: 11:00:00 0.5 mL via INTRAMUSCULAR
  Filled 2021-01-21: qty 0.5

## 2021-01-21 MED ORDER — ONDANSETRON HCL 4 MG PO TABS: 4.0000 mg | ORAL_TABLET | Freq: Four times a day (QID) | ORAL | Status: DC | PRN

## 2021-01-21 MED ORDER — FINASTERIDE 5 MG PO TABS
5.0000 mg | ORAL_TABLET | Freq: Every day | ORAL | Status: DC
  Administered 2021-01-22 – 2021-01-24 (×3): 5 mg via ORAL
  Filled 2021-01-21 (×3): qty 1

## 2021-01-21 MED ORDER — SODIUM CHLORIDE 0.9 % IV BOLUS
1000.0000 mL | Freq: Once | INTRAVENOUS | Status: AC
Start: 2021-01-21 — End: 2021-01-21
  Administered 2021-01-21: 1000 mL via INTRAVENOUS

## 2021-01-21 MED ORDER — BISACODYL 5 MG PO TBEC: 5.0000 mg | DELAYED_RELEASE_TABLET | Freq: Every day | ORAL | Status: DC | PRN

## 2021-01-21 MED ORDER — ACETAMINOPHEN 650 MG RE SUPP: 650.0000 mg | Freq: Four times a day (QID) | RECTAL | Status: DC | PRN

## 2021-01-21 MED ORDER — ROSUVASTATIN CALCIUM 10 MG PO TABS
5.0000 mg | ORAL_TABLET | Freq: Every day | ORAL | Status: DC
  Administered 2021-01-21 – 2021-01-23 (×3): 5 mg via ORAL
  Filled 2021-01-21 (×4): qty 1

## 2021-01-21 MED ORDER — ACETAMINOPHEN 325 MG PO TABS: 650.0000 mg | ORAL_TABLET | Freq: Four times a day (QID) | ORAL | Status: DC | PRN

## 2021-01-21 NOTE — ED Notes (Signed)
Unable to place foley catheter. Informed CN who is ordering urology cart to be sent.

## 2021-01-21 NOTE — Assessment & Plan Note (Addendum)
As outlined above

## 2021-01-21 NOTE — ED Notes (Signed)
Urology at bedside placing indwelling catheter.

## 2021-01-21 NOTE — Assessment & Plan Note (Addendum)
Had not been taking his statin. Lipid profile: total chol 256, HDL 29, LDL 200, normal TG's (worse than prior 4 years ago). -- Continue Crestor.   --Send prescription at discharge. --PCP follow up at discharge

## 2021-01-21 NOTE — Assessment & Plan Note (Addendum)
Present on admission with creatinine 5.72. Due to postobstructive uropathy with bladder outlet obstruction secondary to enlarged prostate. Coude catheter placed by urology 1/16.  Improving, Cr 2.16 today.

## 2021-01-21 NOTE — ED Notes (Signed)
See triage note  presents with some freq urination   states he was seen at [facility] and placed on cipro  last dose was yesterday  denies any fever or n/v

## 2021-01-21 NOTE — ED Notes (Signed)
Pt to ED for urinary frequency with less amount per voiding than normal, since 1 week.  Denies pain, burning with urination. Hx enlarged prostate. Denies any pain. Ambulatory with steady gait.

## 2021-01-21 NOTE — ED Triage Notes (Addendum)
Pt states " I want my sugar checked, Ive been having increased urinary frequency" for the past 5 days. Denies pain with urination.

## 2021-01-21 NOTE — H&P (Signed)
History and Physical    Patient: Ralph [name] FUX:[id number] DOB: 03-12-1965 DOA: 01/21/2021 DOS: the patient was seen and examined on 01/21/2021 PCP: [facility]  Patient coming from: Home  Chief Complaint: Urinary frequency  HPI: Ralph [name] is a 56 y.o. male with medical history significant of hyperlipidemia, prediabetes, MS and low-grade prostate cancer who presented to the ED from home due to ongoing urinary frequency for about 1 week.  He had seen his urologist and was started on Cipro for presumed UTI.  Patient reports he took that for 6 days and did not get any better.  No fevers or chills.  Urine culture from that visit is now negative.  Patient found to be in acute renal failure with creatinine above 5.  Denies any history of chronic kidney disease.  He denies uremic symptoms including nausea vomiting, pruritus, taste alterations, confusion.  He denies any dysuria or hematuria, only frequency.  In the ED vitals were notable for blood pressure 153/89, otherwise normal. Labs notable for BUN 43 and creatinine 5.72, up from 1.1 prior baseline from 4 years ago.  CBC was notable for hemoglobin 12.7 and white count 29.5k.   Urinalysis was negative for any signs of infection.   CT renal stone protocol showed severe bilateral hydronephrosis and massive bladder distention in the setting of severe prostatomegaly.  Nephrology and urology were consulted. Due to difficult Foley placement, urologist placed a Coude catheter in the ED with immediate return of 1600 cc pale yellow urine.  Patient was admitted to [facility] service for further evaluation management pending improvement in renal function.      Review of Systems: As mentioned in the history of present illness. All other systems reviewed and are negative. Past Medical History:  Diagnosis Date   Elevated PSA    MS (multiple sclerosis) ([place])    Prostate cancer ([place]) 2013   Past Surgical History:  Procedure Laterality Date    KNEE SURGERY  2002   meniscus   Social History:  reports that he has never smoked. He has never used smokeless tobacco. He reports that he does not drink alcohol and does not use drugs.  No Known Allergies  Family History  Problem Relation Age of Onset   Cancer Sister        breast CA     Prior to Admission medications   Medication Sig Start Date End Date Taking? Authorizing Provider  rosuvastatin (CRESTOR) 5 MG tablet Take 1 tablet (5 mg total) by mouth at bedtime. Start with 1 pill weekly, gradual increase to 1 every other day, then nightly 07/16/16   Olin Hauser, DO    Physical Exam: Vitals:   01/21/21 0945  BP: (!) 153/89  Pulse: 73  Resp: 18  Temp: 98.4 F (36.9 C)  TempSrc: Oral  SpO2: 99%  Weight: 115.7 kg  Height: 5\' 10"  (1.778 m)   General exam: awake, alert, no acute distress HEENT: atraumatic, clear conjunctiva, anicteric sclera, moist mucus membranes, hearing grossly normal  Respiratory system: CTAB, no wheezes, rales or rhonchi, normal respiratory effort. Cardiovascular system: normal S1/S2, RRR, no JVD, murmurs, rubs, gallops,  no pedal edema.   Gastrointestinal system: soft, NT, ND, no HSM felt, +bowel sounds. Central nervous system: A&O x4. no gross focal neurologic deficits, normal speech Genitourinary system: Newly placed Coude catheter with 1600 cc pale yellow urine in bag Extremities: moves all, no edema, normal tone Skin: dry, intact, normal temperature Psychiatry: normal mood, congruent affect, judgement and  insight appear normal   Data Reviewed:  Labs reviewed and notable for BUN 43, creatinine 5.72, hemoglobin 12.7, WBC 29.5.  Urinalysis negative.  CT renal stone protocol with severe bilateral hydronephrosis and bladder distention with severe prostatomegaly .  Assessment/Plan No notes have been filed under this hospital service. Service: Hospitalist   Advance Care Planning: Full code  Consults: Nephrology, urology  Family  Communication: None at bedside during encounter.  Patient able to update  Severity of Illness: The appropriate patient status for this patient is INPATIENT. Inpatient status is judged to be reasonable and necessary in order to provide the required intensity of service to ensure the patient's safety. The patient's presenting symptoms, physical exam findings, and initial radiographic and laboratory data in the context of their chronic comorbidities is felt to place them at high risk for further clinical deterioration. Furthermore, it is not anticipated that the patient will be medically stable for discharge from the hospital within 2 midnights of admission.   * I certify that at the point of admission it is my clinical judgment that the patient will require inpatient hospital care spanning beyond 2 midnights from the point of admission due to high intensity of service, high risk for further deterioration and high frequency of surveillance required.*  Author: Ezekiel Slocumb, DO 01/21/2021 11:51 AM  For on call review [email or web address].

## 2021-01-21 NOTE — ED Notes (Signed)
Pt to CT

## 2021-01-21 NOTE — ED Provider Notes (Signed)
[facility] Provider Note    Event Date/Time   First MD Initiated Contact with Patient 01/21/21 1018     (approximate)  History   Chief Complaint: Urinary Frequency  HPI  VIRGAL WARMUTH is a 56 y.o. male with a past medical history of enlarged prostate, multiple sclerosis, presents to the emergency department for urinary frequency according to the patient for the past week or so he has been experiencing urinary frequency, went to his urologist who he sees for an enlarged prostate was started on ciprofloxacin although states no real change in the frequency.  Patient denies any abdominal pain.  States he has been experiencing intermittent back pain although denies any currently.  No fever.  No dysuria.  No hematuria.  Physical Exam   Triage Vital Signs: ED Triage Vitals  Enc Vitals Group     BP 01/21/21 0945 (!) 153/89     Pulse Rate 01/21/21 0945 73     Resp 01/21/21 0945 18     Temp 01/21/21 0945 98.4 F (36.9 C)     Temp Source 01/21/21 0945 Oral     SpO2 01/21/21 0945 99 %     Weight 01/21/21 0945 255 lb (115.7 kg)     Height 01/21/21 0945 5\' 10"  (1.778 m)     Head Circumference --      Peak Flow --      Pain Score 01/21/21 0927 0     Pain Loc --      Pain Edu? --      Excl. in [place]? --     Most recent vital signs: Vitals:   01/21/21 0945  BP: (!) 153/89  Pulse: 73  Resp: 18  Temp: 98.4 F (36.9 C)  SpO2: 99%    General: Awake, no distress.  CV:  Good peripheral perfusion.  Regular rate and rhythm  Resp:  Normal effort.  Equal breath sounds bilaterally.  Abd:  No distention.  Soft, nontender.  No rebound or guarding.    ED Results / Procedures / Treatments   RADIOLOGY  I have personally reviewed the CT images look most consistent with urinary obstruction likely secondary to enlarged prostate given the distended bladder as well as hydroureteronephrosis. Radiology has read the CT as severe bilateral hydronephrosis and hydroureter  distended bladder measuring up to 20 cm.     MEDICATIONS ORDERED IN ED: Medications - No data to display   IMPRESSION / MDM / [place] / ED COURSE  I reviewed the triage vital signs and the nursing notes.  Patient presents emergency department for urinary frequency thinking that he could be diabetic.  Here the patient denies any abdominal pain but does state intermittent back pain.  Benign abdominal exam on my evaluation.  Patient's labs have resulted showing significant leukocytosis as well as renal failure compared to normal creatinine 4 years ago.  I reviewed the patient's recent urology visit he had a urine culture 01/14/2021 that was negative.  Patient is complaining of urinary frequency.  Given the patient's history of enlarged prostate differential would include prostatic enlargement leading to obstructive uropathy ultimate leading to renal dysfunction.  Given the negative culture from 1 week ago do not suspect urinary infection and the patient is currently on ciprofloxacin.  Regardless of the cause of the renal dysfunction given the significant renal dysfunction I believe the patient will likely require admission to the hospital for further work-up and treatment.  We will begin IV hydration while awaiting  CT imaging and further results.  Patient is agreeable to plan of care.  Patient's work-up is most consistent with bladder outlet obstruction, CT imaging shows very enlarged bladder with hydroureteronephrosis.  I spoke to Dr. Juleen China who has been by to see the patient.  After several attempts were unable to place a Foley catheter.  I spoke to Dr. Erlene Quan who will attempt to place a catheter after she is out of the operating room.  Patient will be admitted to the hospital service for further work-up and treatment.  FINAL CLINICAL IMPRESSION(S) / ED DIAGNOSES   Acute renal failure  Note:  This document was prepared using Dragon voice recognition software and may include unintentional  dictation errors.   Harvest Dark, MD 01/21/21 1201

## 2021-01-21 NOTE — Assessment & Plan Note (Addendum)
Due to enlarged prostate, leading to acute renal failure.  CT abdomen pelvis showed a massively distended bladder, bilateral hydronephrosis..  Patient does have a history of low-grade prostate cancer and elevated PSA, under surveillance. Appears no recent follow up. -- Urology following, appreciate recommendations: Allow Foley to drain for at least 7 days. -- Continue Flomax and finasteride at discharge. --Likely will require procedure as outpatient to address prostate.  Outpatient follow-up with Dr. [name]

## 2021-01-21 NOTE — Assessment & Plan Note (Addendum)
Chronic.  Patient reports no deficits, physical limitations or complications

## 2021-01-21 NOTE — Assessment & Plan Note (Addendum)
Last Hbg A1c in 2018 was 5.8%. Repeat Hbg A1c is 6.3%. --Sliding scale insulin if needed

## 2021-01-21 NOTE — Consult Note (Signed)
Urology Consult  I have been asked to see the patient by Dr. Kerman Passey, for evaluation and management of urinary retention/difficult Foley/hydronephrosis.  Chief Complaint: Urinary frequency  History of Present Illness: Ralph [name] is a 56 y.o. year old male with massive BPH followed at [facility] who presented to the emergency room today after experiencing worsening urinary frequency.  This has been escalating over the past week.  Is not getting any sleep.  Notably, he was seen by PCP last week and placed on Cipro presumptively despite having a negative urinalysis.  His urine culture was negative.  Upon work-up, he underwent CT scan which showed massively distended bladder, bilateral hydronephrosis, renal failure with elevated creatinine greater than 5.  Foley catheter was unable to be placed by nursing staff this urology was consulted to assist with this as well.  He does have a personal history of elevated PSA followed by Dr. [name].  He underwent prostate biopsy in 2019 which showed 1 focus of atypical glands.  Prostate volume at that time was measured at 47 cc.  He did have a prostate MRI back in 2018 at which time the prostate measured much larger, 6 x 2 x 4 0.8 x 7.4 cm.    Past Medical History:  Diagnosis Date   Elevated PSA    MS (multiple sclerosis) ([place])    Prostate cancer ([place]) 2013    Past Surgical History:  Procedure Laterality Date   KNEE SURGERY  2002   meniscus    Home Medications:  Current Meds  Medication Sig   ciprofloxacin (CIPRO) 500 MG tablet Take 500 mg by mouth 2 (two) times daily.    Allergies: No Known Allergies  Family History  Problem Relation Age of Onset   Cancer Sister        breast CA     Social History:  reports that he has never smoked. He has never used smokeless tobacco. He reports that he does not drink alcohol and does not use drugs.  ROS: A complete review of systems was performed.  All systems are negative except for  pertinent findings as noted.  Physical Exam:  Vital signs in last 24 hours: Temp:  [98.4 F (36.9 C)] 98.4 F (36.9 C) (01/16 0945) Pulse Rate:  [73] 73 (01/16 0945) Resp:  [18] 18 (01/16 0945) BP: (153)/(89) 153/89 (01/16 0945) SpO2:  [99 %] 99 % (01/16 0945) Weight:  [115.7 kg] 115.7 kg (01/16 0945) Constitutional:  Alert and oriented, No acute distress HEENT: [place] AT, moist mucus membranes.  Trachea midline, no masses Respiratory: Normal respiratory effort, lungs clear bilaterally GI: Lower abdomen is firm and distended prior to Foley catheter placement, soft after decompression GU: Circumcised phallus with Foley catheter upon placement draining clear yellow urine Neurologic: Grossly intact, no focal deficits, moving all 4 extremities Psychiatric: Normal mood and affect   Laboratory Data:  Recent Labs    01/21/21 0946  WBC 29.5*  HGB 12.7*  HCT 38.8*   Recent Labs    01/21/21 0946  NA 141  K 4.7  CL 105  CO2 25  GLUCOSE 86  BUN 43*  CREATININE 5.72*  CALCIUM 9.3  Radiologic Imaging: CT Renal Stone Study  Result Date: 01/21/2021 CLINICAL DATA:  Bladder neck obstruction, acute renal failure, urinary frequency EXAM: CT ABDOMEN AND PELVIS WITHOUT CONTRAST TECHNIQUE: Multidetector CT imaging of the abdomen and pelvis was performed following the standard protocol without IV contrast. RADIATION DOSE REDUCTION: This exam was  performed according to the departmental dose-optimization program which includes automated exposure control, adjustment of the mA and/or kV according to patient size and/or use of iterative reconstruction technique. COMPARISON:  None. FINDINGS: Lower chest: No acute abnormality. Hepatobiliary: No solid liver abnormality is seen. No gallstones, gallbladder wall thickening, or biliary dilatation. Pancreas: Unremarkable. No pancreatic ductal dilatation or surrounding inflammatory changes. Spleen: Splenomegaly, maximum span 16.4 cm. Adrenals/Urinary Tract: Adrenal  glands are unremarkable. Severe bilateral hydronephrosis and hydroureter to the ureterovesicular junctions. Small nonobstructive calculus of the posterior midportion of the left kidney (series 2, image 34). Distended urinary bladder extending well into the abdomen, measuring at least 20 cm. Stomach/Bowel: Stomach is within normal limits. Appendix appears normal. No evidence of bowel wall thickening, distention, or inflammatory changes. Descending and sigmoid diverticulosis. Vascular/Lymphatic: No significant vascular findings are present. Numerous prominent retroperitoneal and small bowel mesenteric lymph nodes, largest mesenteric nodes measuring up to 2.2 x 1.2 cm (series 2, image 35). Reproductive: Severe prostatomegaly with median lobe hypertrophy. Other: No abdominal wall hernia or abnormality. No ascites. Musculoskeletal: No acute or significant osseous findings. IMPRESSION: 1. Severe bilateral hydronephrosis and hydroureter to the ureterovesicular junctions. Distended urinary bladder extending well into the abdomen, measuring at least 20 cm. Findings are consistent with bladder outlet obstruction in the setting of severe prostatomegaly. 2. Small nonobstructive calculus of the posterior midportion of the left kidney. 3. Numerous prominent retroperitoneal and small bowel mesenteric lymph nodes, largest mesenteric nodes measuring up to 2.2 x 1.2 cm. These are nonspecific and may be reactive, although lymphoproliferative disorder is not excluded. Consider CT follow-up in 3 months. 4. Splenomegaly. Electronically Signed   By: Delanna Ahmadi M.D.   On: 01/21/2021 11:04    Procedure: Verbal consent was obtained.  The patient was prepped in the standard sterile fashion.  An 72 French coud catheter was placed after he was instilled with lidocaine jelly per urethral meatus.  With some difficulty, I was able to navigate the coud catheter into his bladder with a slight twist of the catheter towards the patient's left  prostate at the approximately 2 o'clock position.  The balloon was filled with 10 cc of sterile water.  Notably, very little of the catheter was noted coming from the penis suggestive of his very large prostate.  His care to his left leg.  Ultimately, he drained 1500 cc of clear yellow urine.  Impression/ Plan:   1.  Massive urinary retention-likely secondary to chronic obstruction.  Foley catheter placed, please allow Foley to drain for at least 7 days for maximal urinary decompression in the setting of acute renal failure and bilateral hydronephrosis.  We will plan to go ahead and start him on Flomax and finasteride during this admission to be discharged with this.  Ultimately, he will likely need an outlet procedure, possibly a HoLEP or simple open prostatectomy.  We will have him follow-up with Dr. [name].  2.  Bilateral hydronephrosis-presumably secondary to #1.  Reimage in 48 hours if his creatinine does not improve.  3.  Acute renal failure-presumably secondary to #1.  Please monitor for postobstructive diuresis and replete electrolytes as necessary.  4.  Difficult Foley-ultimately able to advance an 18 coud with some manipulation.  01/21/2021, 2:55 PM  Hollice Espy,  MD

## 2021-01-21 NOTE — ED Notes (Signed)
Explained the wait to pt  waiting on Dr Erlene Quan

## 2021-01-21 NOTE — Assessment & Plan Note (Addendum)
Per chart review, PCP note from July 2018: "known low grade prostate cancer, lost to follow-up for past 2 years... - Previously established with [facility] Dr [name], last visit 01/2014 - Prior PSA trend with elevated PSA and s/p biopsy dx in 2013" Prostate enlargement now causing bladder outlet obstruction.  PSA checked 9.86. --Urology follow up as an outpatient --Oncology follow up at the cancer center as an outpatient

## 2021-01-21 NOTE — Consult Note (Signed)
[facility]  CONSULT NOTE    Date: 01/21/2021                  Patient Name:  Ralph [name]  MRN: [id number]  DOB: 10-27-1965  Age / Sex: 56 y.o., male         PCP: [place]                 Service Requesting Consult: Dr. Kerman Passey                 Reason for Consult: Acute kidney injury            History of Present Illness: Ralph [name] for increased urinary frequency for the last week. Patient was recent started on ciprofloxacin by urology for urinary tract infection. He states that the ciprofloxacin made him nausous and he was not able to tolerate it. He states that this did not help his symptoms.   Denies any fevers, chills, or night sweats. Patient endorses mild discomfort with urination. No gross hematuria. No urinary incontinence.   Patient is in the middle of transferring Primary care providers.   Creatinine of 5.72. CT with bilateral hydronephrosis. Unfortunately, [name] staff were unable to pass a foley catheter.    Medications: Outpatient medications: (Not in a hospital admission)   Current medications: Current Facility-Administered Medications  Medication Dose Route Frequency Provider Last Rate Last Admin   acetaminophen (TYLENOL) tablet 650 mg  650 mg Oral Q6H PRN Nicole Kindred A, DO       Or   acetaminophen (TYLENOL) suppository 650 mg  650 mg Rectal Q6H PRN Nicole Kindred A, DO       bisacodyl (DULCOLAX) EC tablet 5 mg  5 mg Oral Daily PRN Nicole Kindred A, DO       heparin injection 5,000 Units  5,000 Units Subcutaneous Q8H Nicole Kindred A, DO       ondansetron (ZOFRAN) tablet 4 mg  4 mg Oral Q6H PRN Nicole Kindred A, DO       Or   ondansetron (ZOFRAN) injection 4 mg  4 mg Intravenous Q6H PRN Nicole Kindred A, DO       senna-docusate (Senokot-S) tablet 1 tablet  1 tablet Oral QHS PRN Ezekiel Slocumb, DO       Current Outpatient Medications  Medication Sig Dispense Refill    rosuvastatin (CRESTOR) 5 MG tablet Take 1 tablet (5 mg total) by mouth at bedtime. Start with 1 pill weekly, gradual increase to 1 every other day, then nightly 30 tablet 5      Allergies: No Known Allergies    Past Medical History: Past Medical History:  Diagnosis Date   Elevated PSA    MS (multiple sclerosis) (HCC)    Prostate cancer ([place]) 2013     Past Surgical History: Past Surgical History:  Procedure Laterality Date   KNEE SURGERY  2002   meniscus     Family History: Family History  Problem Relation Age of Onset   Cancer Sister        breast CA      Social History: Social History   Socioeconomic History   Marital status: Single    Spouse name: Not on file   Number of children: 0   Years of education: College   Highest education level: Not on file  Occupational History   Occupation: Actor Visual merchandiser)  Tobacco Use   Smoking status: Never  Smokeless tobacco: Never  Vaping Use   Vaping Use: Never used  Substance and Sexual Activity   Alcohol use: No    Comment: Former alcohol consumption   Drug use: No   Sexual activity: Never  Other Topics Concern   Not on file  Social History Narrative   Lives alone   Caffeine use: Diet coke/pepsi/Mt Dew   Right handed    Social Determinants of Health   Financial Resource Strain: Not on file  Food Insecurity: Not on file  Transportation Needs: Not on file  Physical Activity: Not on file  Stress: Not on file  Social Connections: Not on file  Intimate Partner Violence: Not on file     Review of Systems: Review of Systems  Constitutional: Negative.   HENT: Negative.    Eyes: Negative.   Respiratory:  Negative for cough, hemoptysis, sputum production, shortness of breath and wheezing.   Cardiovascular:  Negative for chest pain, palpitations, orthopnea, claudication, leg swelling and PND.  Gastrointestinal: Negative.   Genitourinary:  Positive for dysuria, frequency and urgency. Negative for  flank pain and hematuria.  Musculoskeletal:  Negative for back pain, falls, joint pain, myalgias and neck pain.  Skin: Negative.   Neurological: Negative.   Endo/Heme/Allergies: Negative.   Psychiatric/Behavioral: Negative.     Vital Signs: Blood pressure (!) 153/89, pulse 73, temperature 98.4 F (36.9 C), temperature source Oral, resp. rate 18, height 5\' 10"  (1.778 m), weight 115.7 kg, SpO2 99 %.  Weight trends: Filed Weights   01/21/21 0945  Weight: 115.7 kg    Physical Exam: General: NAD, laying in bed  Head: Normocephalic, atraumatic. Moist oral mucosal membranes  Eyes: Anicteric, PERRL  Neck: Supple, trachea midline  Lungs:  Clear to auscultation  Heart: Regular rate and rhythm  Abdomen:  Soft, nontender,   Extremities:  No peripheral edema.  Neurologic: Nonfocal, moving all four extremities  Skin: No lesions  Access: none     Lab results: Basic Metabolic Panel: Recent Labs  Lab 01/21/21 0946  NA 141  K 4.7  CL 105  CO2 25  GLUCOSE 86  BUN 43*  CREATININE 5.72*  CALCIUM 9.3    Liver Function Tests: No results for input(s): AST, ALT, ALKPHOS, BILITOT, PROT, ALBUMIN in the last 168 hours. No results for input(s): LIPASE, AMYLASE in the last 168 hours. No results for input(s): AMMONIA in the last 168 hours.  CBC: Recent Labs  Lab 01/21/21 0946  WBC 29.5*  HGB 12.7*  HCT 38.8*  MCV 86.0  PLT 248    Cardiac Enzymes: No results for input(s): CKTOTAL, CKMB, CKMBINDEX, TROPONINI in the last 168 hours.  BNP: Invalid input(s): POCBNP  CBG: Recent Labs  Lab 01/21/21 7902  IOXBDZ 32    Microbiology: Results for orders placed or performed during the hospital encounter of 01/21/21  Resp Panel by RT-PCR (Flu A&B, Covid) Nasopharyngeal Swab     Status: None   Collection Time: 01/21/21 10:41 AM   Specimen: Nasopharyngeal Swab; Nasopharyngeal(NP) swabs in vial transport medium  Result Value Ref Range Status   SARS Coronavirus 2 by RT PCR NEGATIVE  NEGATIVE Final    Comment: (NOTE) SARS-CoV-2 target nucleic acids are NOT DETECTED.  The SARS-CoV-2 RNA is generally detectable in upper respiratory specimens during the acute phase of infection. The lowest concentration of SARS-CoV-2 viral copies this assay can detect is 138 copies/mL. A negative result does not preclude SARS-Cov-2 infection and should not be used as the sole basis for treatment or  other patient management decisions. A negative result may occur with  improper specimen collection/handling, submission of specimen other than nasopharyngeal swab, presence of viral mutation(s) within the areas targeted by this assay, and inadequate number of viral copies(<138 copies/mL). A negative result must be combined with clinical observations, patient history, and epidemiological information. The expected result is Negative.  Fact Sheet for Patients:  EntrepreneurPulse.com.au  Fact Sheet for Healthcare Providers:  IncredibleEmployment.be  This test is no t yet approved or cleared by the [place] FDA and  has been authorized for detection and/or diagnosis of SARS-CoV-2 by FDA under an Emergency Use Authorization (EUA). This EUA will remain  in effect (meaning this test can be used) for the duration of the COVID-19 declaration under Section 564(b)(1) of the Act, 21 U.S.C.section 360bbb-3(b)(1), unless the authorization is terminated  or revoked sooner.       Influenza A by PCR NEGATIVE NEGATIVE Final   Influenza B by PCR NEGATIVE NEGATIVE Final    Comment: (NOTE) The Xpert Xpress SARS-CoV-2/FLU/RSV plus assay is intended as an aid in the diagnosis of influenza from Nasopharyngeal swab specimens and should not be used as a sole basis for treatment. Nasal washings and aspirates are unacceptable for Xpert Xpress SARS-CoV-2/FLU/RSV testing.  Fact Sheet for Patients: EntrepreneurPulse.com.au  Fact Sheet for Healthcare  Providers: IncredibleEmployment.be  This test is not yet approved or cleared by the [place] FDA and has been authorized for detection and/or diagnosis of SARS-CoV-2 by FDA under an Emergency Use Authorization (EUA). This EUA will remain in effect (meaning this test can be used) for the duration of the COVID-19 declaration under Section 564(b)(1) of the Act, 21 U.S.C. section 360bbb-3(b)(1), unless the authorization is terminated or revoked.  Performed at [facility], [address] [place], [place] [address]     Coagulation Studies: No results for input(s): LABPROT, INR in the last 72 hours.  Urinalysis: Recent Labs    01/21/21 0946  COLORURINE YELLOW  LABSPEC 1.010  PHURINE 5.5  GLUCOSEU NEGATIVE  HGBUR TRACE*  BILIRUBINUR NEGATIVE  KETONESUR NEGATIVE  PROTEINUR NEGATIVE  NITRITE NEGATIVE  LEUKOCYTESUR NEGATIVE      Imaging: CT Renal Stone Study  Result Date: 01/21/2021 CLINICAL DATA:  Bladder neck obstruction, acute renal failure, urinary frequency EXAM: CT ABDOMEN AND PELVIS WITHOUT CONTRAST TECHNIQUE: Multidetector CT imaging of the abdomen and pelvis was performed following the standard protocol without IV contrast. RADIATION DOSE REDUCTION: This exam was performed according to the departmental dose-optimization program which includes automated exposure control, adjustment of the mA and/or kV according to patient size and/or use of iterative reconstruction technique. COMPARISON:  None. FINDINGS: Lower chest: No acute abnormality. Hepatobiliary: No solid liver abnormality is seen. No gallstones, gallbladder wall thickening, or biliary dilatation. Pancreas: Unremarkable. No pancreatic ductal dilatation or surrounding inflammatory changes. Spleen: Splenomegaly, maximum span 16.4 cm. Adrenals/Urinary Tract: Adrenal glands are unremarkable. Severe bilateral hydronephrosis and hydroureter to the ureterovesicular junctions. Small nonobstructive  calculus of the posterior midportion of the left kidney (series 2, image 34). Distended urinary bladder extending well into the abdomen, measuring at least 20 cm. Stomach/Bowel: Stomach is within normal limits. Appendix appears normal. No evidence of bowel wall thickening, distention, or inflammatory changes. Descending and sigmoid diverticulosis. Vascular/Lymphatic: No significant vascular findings are present. Numerous prominent retroperitoneal and small bowel mesenteric lymph nodes, largest mesenteric nodes measuring up to 2.2 x 1.2 cm (series 2, image 35). Reproductive: Severe prostatomegaly with median lobe hypertrophy. Other: No abdominal wall hernia or abnormality.  No ascites. Musculoskeletal: No acute or significant osseous findings. IMPRESSION: 1. Severe bilateral hydronephrosis and hydroureter to the ureterovesicular junctions. Distended urinary bladder extending well into the abdomen, measuring at least 20 cm. Findings are consistent with bladder outlet obstruction in the setting of severe prostatomegaly. 2. Small nonobstructive calculus of the posterior midportion of the left kidney. 3. Numerous prominent retroperitoneal and small bowel mesenteric lymph nodes, largest mesenteric nodes measuring up to 2.2 x 1.2 cm. These are nonspecific and may be reactive, although lymphoproliferative disorder is not excluded. Consider CT follow-up in 3 months. 4. Splenomegaly. Electronically Signed   By: Delanna Ahmadi M.D.   On: 01/21/2021 11:04     Assessment & Plan: Ralph [name] is a 56 y.o.  male with history of prostate cancer, hyperlipidemia, and remote history of multiple sclerosis who was admitted to [facility] on 01/21/2021 for Acute renal failure ([place]) [N17.9]  Acute kidney injury: baseline creatinine of 1.04, GFR of > 60 on 07/11/2016 Obstructive uropathy Urinary tract infection: with leukocytosis and failed outpatient PO antibiotics.   - Consult urology for foley catheter placement - No acute  indication for dialysis. Will continue to monitor - Empiric antibiotics.      LOS: 0 Mishel Sans 1/16/202312:01 PM

## 2021-01-21 NOTE — Assessment & Plan Note (Addendum)
Presented with WBC 29.5-> 44.2.  Hx of atypical lymphocytosis, may have a lymphoproliferative disorder.  No signs of infection, but on empiric Rocephin for now.  Afebrile.  Urinalysis was completely negative on admission.  He had taken 6 days of Cipro as an outpatient after seen for urinary frequency which turns out was related to obstruction. -- Monitor CBC -- Monitor fever curve and for other signs of infection -Oncology Dr. Rogue Bussing has ordered flow cytometry and LDH today

## 2021-01-22 LAB — CBC WITH DIFFERENTIAL/PLATELET
Abs Immature Granulocytes: 0.15 10*3/uL — ABNORMAL HIGH (ref 0.00–0.07)
Basophils Absolute: 0.1 10*3/uL (ref 0.0–0.1)
Basophils Relative: 0 %
Eosinophils Absolute: 0.1 10*3/uL (ref 0.0–0.5)
Eosinophils Relative: 0 %
HCT: 44.8 % (ref 39.0–52.0)
Hemoglobin: 14.5 g/dL (ref 13.0–17.0)
Immature Granulocytes: 0 %
Lymphocytes Relative: 69 %
Lymphs Abs: 24.7 10*3/uL — ABNORMAL HIGH (ref 0.7–4.0)
MCH: 27.7 pg (ref 26.0–34.0)
MCHC: 32.4 g/dL (ref 30.0–36.0)
MCV: 85.7 fL (ref 80.0–100.0)
Monocytes Absolute: 3.7 10*3/uL — ABNORMAL HIGH (ref 0.1–1.0)
Monocytes Relative: 10 %
Neutro Abs: 7.5 10*3/uL (ref 1.7–7.7)
Neutrophils Relative %: 21 %
Platelets: 273 10*3/uL (ref 150–400)
RBC: 5.23 MIL/uL (ref 4.22–5.81)
RDW: 14.1 % (ref 11.5–15.5)
Smear Review: NORMAL
WBC: 36.2 10*3/uL — ABNORMAL HIGH (ref 4.0–10.5)
nRBC: 0 % (ref 0.0–0.2)

## 2021-01-22 LAB — BASIC METABOLIC PANEL
Anion gap: 15 (ref 5–15)
BUN: 28 mg/dL — ABNORMAL HIGH (ref 6–20)
CO2: 23 mmol/L (ref 22–32)
Calcium: 9.6 mg/dL (ref 8.9–10.3)
Chloride: 102 mmol/L (ref 98–111)
Creatinine, Ser: 2.86 mg/dL — ABNORMAL HIGH (ref 0.61–1.24)
GFR, Estimated: 25 mL/min — ABNORMAL LOW (ref >60)
Glucose, Bld: 86 mg/dL (ref 70–99)
Potassium: 4.2 mmol/L (ref 3.5–5.1)
Sodium: 140 mmol/L (ref 135–145)

## 2021-01-22 LAB — CBC
HCT: 44 % (ref 39.0–52.0)
Hemoglobin: 14.4 g/dL (ref 13.0–17.0)
MCH: 27.7 pg (ref 26.0–34.0)
MCHC: 32.7 g/dL (ref 30.0–36.0)
MCV: 84.6 fL (ref 80.0–100.0)
Platelets: 267 10*3/uL (ref 150–400)
RBC: 5.2 MIL/uL (ref 4.22–5.81)
RDW: 13.9 % (ref 11.5–15.5)
WBC: 36.3 10*3/uL — ABNORMAL HIGH (ref 4.0–10.5)
nRBC: 0 % (ref 0.0–0.2)

## 2021-01-22 LAB — HEPATITIS C ANTIBODY: HCV Ab: NONREACTIVE

## 2021-01-22 LAB — LIPID PANEL
Cholesterol: 256 mg/dL — ABNORMAL HIGH (ref 0–200)
HDL: 29 mg/dL — ABNORMAL LOW (ref >40)
LDL Cholesterol: 200 mg/dL — ABNORMAL HIGH (ref 0–99)
Total CHOL/HDL Ratio: 8.8 RATIO
Triglycerides: 134 mg/dL (ref <150)
VLDL: 27 mg/dL (ref 0–40)

## 2021-01-22 LAB — HIV ANTIBODY (ROUTINE TESTING W REFLEX): HIV Screen 4th Generation wRfx: NONREACTIVE

## 2021-01-22 LAB — PATHOLOGIST SMEAR REVIEW

## 2021-01-22 LAB — HEPATITIS B CORE ANTIBODY, TOTAL: Hep B Core Total Ab: NONREACTIVE

## 2021-01-22 LAB — URINE CULTURE: Culture: NO GROWTH

## 2021-01-22 LAB — PSA: Prostatic Specific Antigen: 9.86 ng/mL — ABNORMAL HIGH (ref 0.00–4.00)

## 2021-01-22 LAB — HEPATITIS B CORE ANTIBODY, IGM: Hep B C IgM: NONREACTIVE

## 2021-01-22 LAB — HEMOGLOBIN A1C
Hgb A1c MFr Bld: 6.3 % — ABNORMAL HIGH (ref 4.8–5.6)
Mean Plasma Glucose: 134.11 mg/dL

## 2021-01-22 LAB — PHOSPHORUS: Phosphorus: 5.2 mg/dL — ABNORMAL HIGH (ref 2.5–4.6)

## 2021-01-22 LAB — HEPATITIS B SURFACE ANTIBODY,QUALITATIVE: Hep B S Ab: NONREACTIVE

## 2021-01-22 LAB — HEPATITIS B SURFACE ANTIGEN: Hepatitis B Surface Ag: NONREACTIVE

## 2021-01-22 MED ORDER — CHLORHEXIDINE GLUCONATE CLOTH 2 % EX PADS
6.0000 | MEDICATED_PAD | Freq: Every day | CUTANEOUS | Status: DC
  Administered 2021-01-22 – 2021-01-23 (×2): 6 via TOPICAL

## 2021-01-22 MED ORDER — SODIUM CHLORIDE 0.9 % IV SOLN
1.0000 g | INTRAVENOUS | Status: DC
  Administered 2021-01-22 – 2021-01-24 (×3): 1 g via INTRAVENOUS
  Filled 2021-01-22 (×2): qty 1
  Filled 2021-01-22: qty 10

## 2021-01-22 NOTE — Assessment & Plan Note (Addendum)
Hx of this per chart review back in 2018, appears pt has not followed up.  WBC on admission 29.5 >> 44.2 today.  No fevers or evidence of infection.   Imaging studies demonstrating significant lymphadenopathy, concerning for an evolving lymphoproliferative disorder. -- Oncology has ordered flow cytometry with lymphoma/leukemia panel and LDH today -He will need outpatient follow-up with cancer center

## 2021-01-22 NOTE — TOC Initial Note (Addendum)
Transition of Care [facility]) - Initial/Assessment Note    Patient Details  Name: Ralph [name] MRN: [id number] Date of Birth: 1965-11-07  Transition of Care [facility]) CM/SW Contact:    Pete Pelt, RN Phone Number: 01/22/2021, 4:20 PM  Clinical Narrative:  Patient lives by himself but plans to stay with his mother on discharge.  He is current with his PCP and has no transportation concerns.  He is able to get medication from the pharmacy and is able to take medications as prescribed.    Patient is  open to having [facility] with nursing visits due to continuous foley.  The following agencies are unable to accept at this time:  [facility], [facility], [facility] checking      Addendum 1707:  [facility] cannot accommodate patient, [facility] looking into if they are able to accommodate patient.             Expected Discharge Plan: Home/Self Care Barriers to Discharge: Continued Medical Work up   Patient Goals and CMS Choice     Choice offered to / list presented to : NA  Expected Discharge Plan and Services Expected Discharge Plan: Home/Self Care   Discharge Planning Services: CM Consult Post Acute Care Choice:  (foley catheter) Living arrangements for the past 2 months: Single Family Home                                      Prior Living Arrangements/Services Living arrangements for the past 2 months: Single Family Home Lives with:: Self (Patient states he will be staying with his mother) Patient language and need for interpreter reviewed:: Yes Do you feel safe going back to the place where you live?: Yes      Need for Family Participation in Patient Care: Yes (Comment) Care giver support system in place?: Yes (comment)   Criminal Activity/Legal Involvement Pertinent to Current Situation/Hospitalization: No - Comment as needed  Activities of Daily Living Home Assistive Devices/Equipment: None ADL Screening (condition at time of admission) Patient's cognitive  ability adequate to safely complete daily activities?: Yes Is the patient deaf or have difficulty hearing?: No Does the patient have difficulty seeing, even when wearing glasses/contacts?: No Does the patient have difficulty concentrating, remembering, or making decisions?: No Patient able to express need for assistance with ADLs?: Yes Does the patient have difficulty dressing or bathing?: No Independently performs ADLs?: Yes (appropriate for developmental age) Does the patient have difficulty walking or climbing stairs?: No Weakness of Legs: None Weakness of Arms/Hands: None  Permission Sought/Granted Permission sought to share information with : Case Manager Permission granted to share information with : Yes, Verbal Permission Granted              Emotional Assessment Appearance:: Appears stated age Attitude/Demeanor/Rapport: Gracious, Engaged Affect (typically observed): Pleasant, Appropriate Orientation: : Oriented to Situation, Oriented to  Time, Oriented to Place, Oriented to Self Alcohol / Substance Use: Not Applicable Psych Involvement: No (comment)  Admission diagnosis:  Acute renal failure ([place]) [N17.9] Bladder outlet obstruction [N32.0] Acute renal failure, unspecified acute renal failure type ([place]) [N17.9] Patient Active Problem List   Diagnosis Date Noted   AKI (acute kidney injury) ([place]) 01/21/2021   Bladder outlet obstruction 01/21/2021   Leukocytosis 01/21/2021   Obstructive uropathy 01/21/2021   Pre-diabetes 07/15/2016   Hyperlipidemia 07/15/2016   Atypical lymphocytosis 07/15/2016   Elevated PSA 07/11/2016   Prostate cancer ([place])  07/11/2016   MS (multiple sclerosis) ([place]) 07/11/2016   Screening for colon cancer 07/11/2016   PCP:  [facility]:   [facility] [address] [place] [address] Phone: [phone] Fax: [phone]     Social Determinants of Health (SDOH)  Interventions    Readmission Risk Interventions No flowsheet data found.

## 2021-01-22 NOTE — Progress Notes (Addendum)
Progress Note   Patient: Ralph [name] HYQ:[id number] DOB: 09/27/1965 DOA: 01/21/2021     1 DOS: the patient was seen and examined on 01/22/2021   Brief hospital course: 56 y.o. male with medical history significant of hyperlipidemia, prediabetes, MS and low-grade prostate cancer who presented to the ED from home due to ongoing urinary frequency for about 1 week.  He had seen his urologist and was started on Cipro for presumed UTI.  Patient reports he took that for 6 days and did not get any better.  No fevers or chills.  Urine culture from that visit is now negative.  Patient found to be in acute renal failure with creatinine 5.72.  CT scan showed severe bilateral hydronephrosis with massive bladder distention in the setting of severe prostatomegaly.    Admitted to medicine service.   Urology and nephrology are consulted.   Foley catheter was placed by urology on admission.  Renal function improving.  Assessment and Plan * AKI (acute kidney injury) ([place])- (present on admission) Present on admission with creatinine 5.72. Due to postobstructive uropathy with bladder outlet obstruction secondary to enlarged prostate. Coude catheter placed by urology 1/16.  Improving, Cr 2.86 today.   Total output to date >10 L. -- Nephrology and urology following --Follow BMPs --Avoid nephrotoxins    Obstructive uropathy- (present on admission) As outlined  Bladder outlet obstruction- (present on admission) Due to enlarged prostate, leading to acute renal failure.  CT abdomen pelvis showed a massively distended bladder, bilateral hydronephrosis..  Patient does have a history of low-grade prostate cancer and elevated PSA, under surveillance. Appears no recent follow up. -- Urology following, appreciate recommendations: Allow Foley to drain for at least 7 days. --Started on Flomax and finasteride, continue at discharge. --Likely will require procedure as outpatient to address prostate.  Outpatient  follow-up with Dr. [name]  Prostate cancer [facility])- (present on admission) Per chart review, PCP note from July 2018: "known low grade prostate cancer, lost to follow-up for past 2 years... - Previously established with [facility] Dr [name], last visit 01/2014 - Prior PSA trend with elevated PSA and s/p biopsy dx in 2013" Prostate enlargement now causing bladder outlet obstruction.  PSA checked 9.86. --Urology follow up  --Oncology follow up    Leukocytosis- (present on admission) Presented with WBC 29.5.  Hx of atypical lymphocytosis, may have a lymphoproliferative disorder.  No signs of infection, but started on empiric Rocephin for now.  Afebrile.  Urinalysis was completely negative on admission.  He had taken 6 days of Cipro as an outpatient after seen for urinary frequency which turns out was related to obstruction. -- Monitor CBC -- Monitor fever curve and for other signs of infection  Hyperlipidemia- (present on admission) Had not been taking his statin. Lipid profile: total chol 256, HDL 29, LDL 200, normal TG's (worse than prior 4 years ago). --Resume Crestor.   --Send prescription at discharge. --PCP follow up     MS (multiple sclerosis) ([place])- (present on admission) Chronic.  Patient reports no deficits, physical limitations or complications.  Pre-diabetes- (present on admission) Last Hbg A1c in 2018 was 5.8%. Repeat Hbg A1c is 6.3%. Fasting glucose on BMP's okay. --PCP follow up --Monitor AM fasting glucose for now --Sliding scale insulin if needed  Atypical lymphocytosis- (present on admission) Hx of this per chart review back in 2018, appears pt has not followed up.  WBC on admission 29.5 >> 36.3 today.  No fevers or evidence of infection.   Imaging  studies demonstrating significant lymphadenopathy, concerning for an evolving lymphoproliferative disorder. --Peripheral blood smear --Add on differential --Reached out to heme/oncology re consult vs  outpatient follow up  Elevated PSA- (present on admission) PSA is 9.86.  Last time checked was in 2018, PSA 8.31 at that time.  Urology and oncology follow up.     Subjective: Patient awake resting in bed when seen today, sister at bedside.  He reports feeling well in general.  No fevers or chills.  Some discomfort with catheter.  Has put out total over 10 L urine since catheter placed.  Patient has no acute complaints.  Objective Vitals reviewed and notable for BP 137/111, elevated diastolic Bps, otherwise normal vitals.  No fevers  General exam: awake, alert, no acute distress HEENT: atraumatic, clear conjunctiva, anicteric sclera, moist mucus membranes, hearing grossly normal  Respiratory system: CTAB, no wheezes, rales or rhonchi, normal respiratory effort. Cardiovascular system: normal S1/S2,  RRR, no JVD, murmurs, rubs, gallops,  no pedal edema.   Genitourinary system: Foley in place with pale yellow urine in the bag. Central nervous system: A&O x4. no gross focal neurologic deficits, normal speech Extremities: moves all , no edema, normal tone Skin: dry, intact, normal temperature Psychiatry: normal mood, congruent affect, judgement and insight appear normal   Data Reviewed:  Labs reviewed, notable for creatinine improved 5.72 down to 2.86, BUN improved from 43 down to 28, phosphorus is 5.2, lipid profile: Total cholesterol 256, low HDL 29, elevated LDL 200, normal triglycerides 134.  Leukocytosis worsened with white count 29.5 yesterday up to 36.3 today.  Differential notable for elevated lymphocytes and monocytes.  PSA elevated 9.86.  Hemoglobin A1c elevated 6.3%  Peripheral blood smear: "Numerous atypical/abnormal lymphocytes, with clumped chromatin and scant to moderate cytoplasm, and scattered smudge cells.  Normocytic erythrocytes, without significant anisopoikilocytosis.  Normal platelet count and morphology.  The findings, when taken together with radiographic studies  demonstrating significant lymphadenopathy, are concerning for an evolving lymphoproliferative disorder.  Review of records indicates persistent lymphocytosis as far back as 2018.  Hematologic evaluation, with possible lymph node biopsy and peripheral blood flow cytometry, may be warranted."   Family Communication: Sister at bedside on rounds   Disposition: Status is: Inpatient  Remains inpatient appropriate because: Severity of illness with acute renal failure with close monitoring and evaluation ongoing.  Discharge pending clearance by consulting specialists.         Time spent: 35 minutes  Author: Ezekiel Slocumb, DO 01/22/2021 4:40 PM  For on call review [email or web address].

## 2021-01-22 NOTE — Hospital Course (Addendum)
56 y.o. male with medical history significant of hyperlipidemia, prediabetes, MS and low-grade prostate cancer who presented to the ED from home due to ongoing urinary frequency for about 1 week.  He had seen his urologist and was started on Cipro for presumed UTI.  Patient reports he took that for 6 days and did not get any better.  No fevers or chills.  Urine culture from that visit is now negative.  Patient found to be in acute renal failure with creatinine 5.72.  CT scan showed severe bilateral hydronephrosis with massive bladder distention in the setting of severe prostatomegaly.    Admitted to medicine service.   Urology and nephrology are consulted.   Foley catheter was placed by urology on admission.  Renal function improving.  1/18: Oncology consult as WBC creeping up.  Checking LDH and flow cytometry panel per oncology with plan for discharge tomorrow

## 2021-01-22 NOTE — Assessment & Plan Note (Addendum)
PSA is 9.86.  Last time checked was in 2018, PSA 8.31 at that time.  Urology and oncology follow up. Dr. Rogue Bussing seen him today and checking flow cytometry and LDH.  Outpatient follow-up at cancer center

## 2021-01-22 NOTE — Progress Notes (Addendum)
[facility]  ROUNDING NOTE   Subjective:   Patient seen resting in bed, alert and oriented States he feels much better after Foley placed Creatinine improved to 2.86 Urine output recorded 7 L.  Foley in place with yellow tinged urine output  Objective:  Vital signs in last 24 hours:  Temp:  [98.2 F (36.8 C)-98.6 F (37 C)] 98.4 F (36.9 C) (01/17 1135) Pulse Rate:  [75-92] 92 (01/17 1135) Resp:  [18-20] 18 (01/17 1135) BP: (108-159)/(70-113) 125/113 (01/17 1135) SpO2:  [97 %-100 %] 97 % (01/17 1135) Weight:  [118.2 kg] 118.2 kg (01/16 1806)  Weight change:  Filed Weights   01/21/21 0945 01/21/21 1806  Weight: 115.7 kg 118.2 kg    Intake/Output: I/O last 3 completed shifts: In: 500 [P.O.:500] Out: 7050 [Urine:7050]   Intake/Output this shift:  Total I/O In: 240 [P.O.:240] Out: 1700 [Urine:1700]  Physical Exam: General: NAD, resting in bed  Head: Normocephalic, atraumatic. Moist oral mucosal membranes  Eyes: Anicteric  Lungs:  Clear to auscultation, normal effort  Heart: Regular rate and rhythm  Abdomen:  Soft, nontender  Extremities: No peripheral edema.  Neurologic: Nonfocal, moving all four extremities  Skin: No lesions  GU Foley cath    Basic Metabolic Panel: Recent Labs  Lab 01/21/21 0946 01/22/21 0609  NA 141 140  K 4.7 4.2  CL 105 102  CO2 25 23  GLUCOSE 86 86  BUN 43* 28*  CREATININE 5.72* 2.86*  CALCIUM 9.3 9.6  PHOS  --  5.2*    Liver Function Tests: No results for input(s): AST, ALT, ALKPHOS, BILITOT, PROT, ALBUMIN in the last 168 hours. No results for input(s): LIPASE, AMYLASE in the last 168 hours. No results for input(s): AMMONIA in the last 168 hours.  CBC: Recent Labs  Lab 01/21/21 0946 01/22/21 0609 01/22/21 0611  WBC 29.5* 36.3* 36.2*  NEUTROABS  --   --  7.5  HGB 12.7* 14.4 14.5  HCT 38.8* 44.0 44.8  MCV 86.0 84.6 85.7  PLT 248 267 273    Cardiac Enzymes: No results for input(s): CKTOTAL, CKMB,  CKMBINDEX, TROPONINI in the last 168 hours.  BNP: Invalid input(s): POCBNP  CBG: Recent Labs  Lab 01/21/21 8786  VEHMCN 47    Microbiology: Results for orders placed or performed during the hospital encounter of 01/21/21  Resp Panel by RT-PCR (Flu A&B, Covid) Nasopharyngeal Swab     Status: None   Collection Time: 01/21/21 10:41 AM   Specimen: Nasopharyngeal Swab; Nasopharyngeal(NP) swabs in vial transport medium  Result Value Ref Range Status   SARS Coronavirus 2 by RT PCR NEGATIVE NEGATIVE Final    Comment: (NOTE) SARS-CoV-2 target nucleic acids are NOT DETECTED.  The SARS-CoV-2 RNA is generally detectable in upper respiratory specimens during the acute phase of infection. The lowest concentration of SARS-CoV-2 viral copies this assay can detect is 138 copies/mL. A negative result does not preclude SARS-Cov-2 infection and should not be used as the sole basis for treatment or other patient management decisions. A negative result may occur with  improper specimen collection/handling, submission of specimen other than nasopharyngeal swab, presence of viral mutation(s) within the areas targeted by this assay, and inadequate number of viral copies(<138 copies/mL). A negative result must be combined with clinical observations, patient history, and epidemiological information. The expected result is Negative.  Fact Sheet for Patients:  EntrepreneurPulse.com.au  Fact Sheet for Healthcare Providers:  IncredibleEmployment.be  This test is no t yet approved or cleared by the  [place] Architectural technologist and  has been authorized for detection and/or diagnosis of SARS-CoV-2 by FDA under an Print production planner (EUA). This EUA will remain  in effect (meaning this test can be used) for the duration of the COVID-19 declaration under Section 564(b)(1) of the Act, 21 U.S.C.section 360bbb-3(b)(1), unless the authorization is terminated  or revoked sooner.        Influenza A by PCR NEGATIVE NEGATIVE Final   Influenza B by PCR NEGATIVE NEGATIVE Final    Comment: (NOTE) The Xpert Xpress SARS-CoV-2/FLU/RSV plus assay is intended as an aid in the diagnosis of influenza from Nasopharyngeal swab specimens and should not be used as a sole basis for treatment. Nasal washings and aspirates are unacceptable for Xpert Xpress SARS-CoV-2/FLU/RSV testing.  Fact Sheet for Patients: EntrepreneurPulse.com.au  Fact Sheet for Healthcare Providers: IncredibleEmployment.be  This test is not yet approved or cleared by the [place] FDA and has been authorized for detection and/or diagnosis of SARS-CoV-2 by FDA under an Emergency Use Authorization (EUA). This EUA will remain in effect (meaning this test can be used) for the duration of the COVID-19 declaration under Section 564(b)(1) of the Act, 21 U.S.C. section 360bbb-3(b)(1), unless the authorization is terminated or revoked.  Performed at [facility], [address] [place], [place] [address]     Coagulation Studies: No results for input(s): LABPROT, INR in the last 72 hours.  Urinalysis: Recent Labs    01/21/21 0946  COLORURINE YELLOW  LABSPEC 1.010  PHURINE 5.5  GLUCOSEU NEGATIVE  HGBUR TRACE*  BILIRUBINUR NEGATIVE  KETONESUR NEGATIVE  PROTEINUR NEGATIVE  NITRITE NEGATIVE  LEUKOCYTESUR NEGATIVE      Imaging: CT Renal Stone Study  Result Date: 01/21/2021 CLINICAL DATA:  Bladder neck obstruction, acute renal failure, urinary frequency EXAM: CT ABDOMEN AND PELVIS WITHOUT CONTRAST TECHNIQUE: Multidetector CT imaging of the abdomen and pelvis was performed following the standard protocol without IV contrast. RADIATION DOSE REDUCTION: This exam was performed according to the departmental dose-optimization program which includes automated exposure control, adjustment of the mA and/or kV according to patient size and/or use of iterative  reconstruction technique. COMPARISON:  None. FINDINGS: Lower chest: No acute abnormality. Hepatobiliary: No solid liver abnormality is seen. No gallstones, gallbladder wall thickening, or biliary dilatation. Pancreas: Unremarkable. No pancreatic ductal dilatation or surrounding inflammatory changes. Spleen: Splenomegaly, maximum span 16.4 cm. Adrenals/Urinary Tract: Adrenal glands are unremarkable. Severe bilateral hydronephrosis and hydroureter to the ureterovesicular junctions. Small nonobstructive calculus of the posterior midportion of the left kidney (series 2, image 34). Distended urinary bladder extending well into the abdomen, measuring at least 20 cm. Stomach/Bowel: Stomach is within normal limits. Appendix appears normal. No evidence of bowel wall thickening, distention, or inflammatory changes. Descending and sigmoid diverticulosis. Vascular/Lymphatic: No significant vascular findings are present. Numerous prominent retroperitoneal and small bowel mesenteric lymph nodes, largest mesenteric nodes measuring up to 2.2 x 1.2 cm (series 2, image 35). Reproductive: Severe prostatomegaly with median lobe hypertrophy. Other: No abdominal wall hernia or abnormality. No ascites. Musculoskeletal: No acute or significant osseous findings. IMPRESSION: 1. Severe bilateral hydronephrosis and hydroureter to the ureterovesicular junctions. Distended urinary bladder extending well into the abdomen, measuring at least 20 cm. Findings are consistent with bladder outlet obstruction in the setting of severe prostatomegaly. 2. Small nonobstructive calculus of the posterior midportion of the left kidney. 3. Numerous prominent retroperitoneal and small bowel mesenteric lymph nodes, largest mesenteric nodes measuring up to 2.2 x 1.2 cm. These are nonspecific and may be reactive,  although lymphoproliferative disorder is not excluded. Consider CT follow-up in 3 months. 4. Splenomegaly. Electronically Signed   By: Delanna Ahmadi M.D.    On: 01/21/2021 11:04     Medications:    cefTRIAXone (ROCEPHIN)  IV 1 g (01/22/21 1049)    Chlorhexidine Gluconate Cloth  6 each Topical Daily   finasteride  5 mg Oral Daily   heparin  5,000 Units Subcutaneous Q8H   rosuvastatin  5 mg Oral QHS   tamsulosin  0.4 mg Oral Daily   acetaminophen **OR** acetaminophen, bisacodyl, ondansetron **OR** ondansetron (ZOFRAN) IV, senna-docusate  Assessment/ Plan:  [name] is a 56 y.o.  male with history of prostate cancer, hyperlipidemia, and remote history of multiple sclerosis who was admitted to [facility] on 01/21/2021 for Acute renal failure ([place]) [N17.9] Bladder outlet obstruction [N32.0] Acute renal failure, unspecified acute renal failure type ([place]) [N17.9]   Acute Kidney Injury with baseline creatinine 1.04 and GFR of >60 on 07/11/16.  Acute kidney injury secondary to urinary retention due to enlarged prostate Foley placed by urology with recorded urine output of 7 L in 24 hours.  Creatinine improved this a.m. Will discharge with Foley catheter in place.  Will order outpatient follow-up with nephrology to monitor renal function recovery.  Patient cleared to discharge from renal stance  Lab Results  Component Value Date   CREATININE 2.86 (H) 01/22/2021   CREATININE 5.72 (H) 01/21/2021   CREATININE 1.04 07/11/2016    Intake/Output Summary (Last 24 hours) at 01/22/2021 1137 Last data filed at 01/22/2021 1100 Gross per 24 hour  Intake 740 ml  Output 8750 ml  Net -8010 ml   2.  Obstructive uropathy treated with Foley catheter insertion.  Urology following and will follow patient outpatient.  3.  Urinary tract infection with leukocytosis and failed outpatient p.o. antibiotics.  Currently receiving Rocephin    LOS: 1 [place] 1/17/202311:37 AM

## 2021-01-23 ENCOUNTER — Encounter: Payer: Self-pay | Admitting: Internal Medicine

## 2021-01-23 ENCOUNTER — Telehealth: Payer: Self-pay | Admitting: Internal Medicine

## 2021-01-23 DIAGNOSIS — N179 Acute kidney failure, unspecified: Secondary | ICD-10-CM | POA: Diagnosis not present

## 2021-01-23 DIAGNOSIS — C61 Malignant neoplasm of prostate: Secondary | ICD-10-CM

## 2021-01-23 DIAGNOSIS — D72820 Lymphocytosis (symptomatic): Secondary | ICD-10-CM

## 2021-01-23 DIAGNOSIS — N320 Bladder-neck obstruction: Secondary | ICD-10-CM

## 2021-01-23 DIAGNOSIS — R972 Elevated prostate specific antigen [PSA]: Secondary | ICD-10-CM

## 2021-01-23 LAB — CBC WITH DIFFERENTIAL/PLATELET
Abs Immature Granulocytes: 0.18 10*3/uL — ABNORMAL HIGH (ref 0.00–0.07)
Basophils Absolute: 0.2 10*3/uL — ABNORMAL HIGH (ref 0.0–0.1)
Basophils Relative: 0 %
Eosinophils Absolute: 0.1 10*3/uL (ref 0.0–0.5)
Eosinophils Relative: 0 %
HCT: 45.8 % (ref 39.0–52.0)
Hemoglobin: 14.8 g/dL (ref 13.0–17.0)
Immature Granulocytes: 0 %
Lymphocytes Relative: 73 %
Lymphs Abs: 31.6 10*3/uL — ABNORMAL HIGH (ref 0.7–4.0)
MCH: 27.3 pg (ref 26.0–34.0)
MCHC: 32.3 g/dL (ref 30.0–36.0)
MCV: 84.3 fL (ref 80.0–100.0)
Monocytes Absolute: 4.9 10*3/uL — ABNORMAL HIGH (ref 0.1–1.0)
Monocytes Relative: 11 %
Neutro Abs: 7.2 10*3/uL (ref 1.7–7.7)
Neutrophils Relative %: 16 %
Platelets: 297 10*3/uL (ref 150–400)
RBC: 5.43 MIL/uL (ref 4.22–5.81)
RDW: 13.9 % (ref 11.5–15.5)
Smear Review: NORMAL
WBC Morphology: ABNORMAL
WBC: 44.2 10*3/uL — ABNORMAL HIGH (ref 4.0–10.5)
nRBC: 0 % (ref 0.0–0.2)

## 2021-01-23 LAB — LACTATE DEHYDROGENASE: LDH: 214 U/L — ABNORMAL HIGH (ref 98–192)

## 2021-01-23 LAB — BASIC METABOLIC PANEL
Anion gap: 12 (ref 5–15)
BUN: 24 mg/dL — ABNORMAL HIGH (ref 6–20)
CO2: 24 mmol/L (ref 22–32)
Calcium: 9.1 mg/dL (ref 8.9–10.3)
Chloride: 98 mmol/L (ref 98–111)
Creatinine, Ser: 2.16 mg/dL — ABNORMAL HIGH (ref 0.61–1.24)
GFR, Estimated: 35 mL/min — ABNORMAL LOW (ref >60)
Glucose, Bld: 113 mg/dL — ABNORMAL HIGH (ref 70–99)
Potassium: 4 mmol/L (ref 3.5–5.1)
Sodium: 134 mmol/L — ABNORMAL LOW (ref 135–145)

## 2021-01-23 LAB — PHOSPHORUS: Phosphorus: 5 mg/dL — ABNORMAL HIGH (ref 2.5–4.6)

## 2021-01-23 MED ORDER — TAMSULOSIN HCL 0.4 MG PO CAPS: 0.4000 mg | ORAL_CAPSULE | Freq: Every day | ORAL | 0 refills | Status: AC

## 2021-01-23 MED ORDER — FINASTERIDE 5 MG PO TABS: 5.0000 mg | ORAL_TABLET | Freq: Every day | ORAL | 0 refills | Status: AC

## 2021-01-23 MED ORDER — ROSUVASTATIN CALCIUM 5 MG PO TABS: 5.0000 mg | ORAL_TABLET | Freq: Every day | ORAL | 0 refills | Status: DC

## 2021-01-23 NOTE — Telephone Encounter (Signed)
Please have the patient follow-up with me in 2 to 3 weeks-MD labs CBC CMP; LDH.   GB

## 2021-01-23 NOTE — Consult Note (Addendum)
[place] [facility] CONSULT NOTE  Patient Care Team: [facility] as PCP - General  CHIEF COMPLAINTS/PURPOSE OF CONSULTATION: Lymphocytosis  HISTORY OF PRESENTING ILLNESS:  Ralph [name] 56 y.o.  male patient with a history of multiple sclerosis currently not on therapy; and history of prostate cancer-under active surveillance [[facility]]-is currently admitted to hospital for acute urinary retention.  CT scan showed bilateral hydronephrosis; and up to 2 cm mesenteric/retroperitoneal adenopathy.  Patient is currently s/p evaluation with urology.  Patient currently has a Foley catheter draining blood-tinged urine.  PSA around 9.  Patient noted to have elevated white count predominant lymphocytosis 30-40,000; no anemia and no thrombocytopenia.  Of note patient noted to have elevated white count of 15,000/lymphocytosis in 2018.  Patient has not had any weight loss.  Denies any night sweats or loss of appetite or lumps or bumps.   Review of Systems  Constitutional:  Negative for chills, diaphoresis, fever, malaise/fatigue and weight loss.  HENT:  Negative for nosebleeds and sore throat.   Eyes:  Negative for double vision.  Respiratory:  Negative for cough, hemoptysis, sputum production, shortness of breath and wheezing.   Cardiovascular:  Negative for chest pain, palpitations, orthopnea and leg swelling.  Gastrointestinal:  Negative for abdominal pain, blood in stool, constipation, diarrhea, heartburn, melena, nausea and vomiting.  Genitourinary:  Positive for dysuria, hematuria and urgency. Negative for frequency.  Musculoskeletal:  Negative for back pain and joint pain.  Skin: Negative.  Negative for itching and rash.  Neurological:  Negative for dizziness, tingling, focal weakness, weakness and headaches.  Endo/Heme/Allergies:  Does not bruise/bleed easily.  Psychiatric/Behavioral:  Negative for depression. The patient is not nervous/anxious and does not have insomnia.      MEDICAL HISTORY:  Past Medical History:  Diagnosis Date   Elevated PSA    MS (multiple sclerosis) ([place])    Prostate cancer ([place]) 2013    SURGICAL HISTORY: Past Surgical History:  Procedure Laterality Date   KNEE SURGERY  2002   meniscus    SOCIAL HISTORY: Social History   Socioeconomic History   Marital status: Single    Spouse name: Not on file   Number of children: 0   Years of education: College   Highest education level: Not on file  Occupational History   Occupation: Actor Visual merchandiser)  Tobacco Use   Smoking status: Never   Smokeless tobacco: Never  Vaping Use   Vaping Use: Never used  Substance and Sexual Activity   Alcohol use: No    Comment: Former alcohol consumption   Drug use: No   Sexual activity: Never  Other Topics Concern   Not on file  Social History Narrative   Lives alone   Caffeine use: Diet coke/pepsi/Mt Dew   Right handed       Denies history of smoking.  Denies any alcohol; lives in [place].    Social Determinants of Health   Financial Resource Strain: Not on file  Food Insecurity: Not on file  Transportation Needs: Not on file  Physical Activity: Not on file  Stress: Not on file  Social Connections: Not on file  Intimate Partner Violence: Not on file    FAMILY HISTORY: Family History  Problem Relation Age of Onset   Cancer Sister        breast CA     ALLERGIES:  has No Known Allergies.  MEDICATIONS:  Current Facility-Administered Medications  Medication Dose Route Frequency Provider Last Rate Last Admin   acetaminophen (TYLENOL)  tablet 650 mg  650 mg Oral Q6H PRN Nicole Kindred A, DO       Or   acetaminophen (TYLENOL) suppository 650 mg  650 mg Rectal Q6H PRN Nicole Kindred A, DO       bisacodyl (DULCOLAX) EC tablet 5 mg  5 mg Oral Daily PRN Nicole Kindred A, DO       cefTRIAXone (ROCEPHIN) 1 g in sodium chloride 0.9 % 100 mL IVPB  1 g Intravenous Q24H Nicole Kindred A, DO   Stopped at 01/23/21 1037    Chlorhexidine Gluconate Cloth 2 % PADS 6 each  6 each Topical Daily Nicole Kindred A, DO   6 each at 01/23/21 1003   finasteride (PROSCAR) tablet 5 mg  5 mg Oral Daily Hollice Espy, MD   5 mg at 01/23/21 0957   heparin injection 5,000 Units  5,000 Units Subcutaneous Q8H Nicole Kindred A, DO   5,000 Units at 01/23/21 1340   ondansetron (ZOFRAN) tablet 4 mg  4 mg Oral Q6H PRN Nicole Kindred A, DO       Or   ondansetron (ZOFRAN) injection 4 mg  4 mg Intravenous Q6H PRN Nicole Kindred A, DO       rosuvastatin (CRESTOR) tablet 5 mg  5 mg Oral QHS Nicole Kindred A, DO   5 mg at 01/22/21 2042   senna-docusate (Senokot-S) tablet 1 tablet  1 tablet Oral QHS PRN Nicole Kindred A, DO       tamsulosin (FLOMAX) capsule 0.4 mg  0.4 mg Oral Daily Hollice Espy, MD   0.4 mg at 01/23/21 0957      .  PHYSICAL EXAMINATION:  Vitals:   01/23/21 1539 01/23/21 2108  BP: (!) 141/90 (!) 131/97  Pulse: 99 87  Resp: 18   Temp: 98 F (36.7 C) 98.4 F (36.9 C)  SpO2: 96% 95%   Filed Weights   01/21/21 0945 01/21/21 1806  Weight: 255 lb (115.7 kg) 260 lb 9.3 oz (118.2 kg)  Patient has Foley catheter in place-blood-tinged urine noted.  Physical Exam Vitals and nursing note reviewed.  HENT:     Head: Normocephalic and atraumatic.     Mouth/Throat:     Pharynx: Oropharynx is clear.  Eyes:     Extraocular Movements: Extraocular movements intact.     Pupils: Pupils are equal, round, and reactive to light.  Cardiovascular:     Rate and Rhythm: Normal rate and regular rhythm.  Pulmonary:     Comments: Decreased breath sounds bilaterally.  Abdominal:     Palpations: Abdomen is soft.  Musculoskeletal:        General: Normal range of motion.     Cervical back: Normal range of motion.  Skin:    General: Skin is warm.  Neurological:     General: No focal deficit present.     Mental Status: He is alert and oriented to person, place, and time.  Psychiatric:        Behavior: Behavior normal.         Judgment: Judgment normal.     LABORATORY DATA:  I have reviewed the data as listed Lab Results  Component Value Date   WBC 44.2 (H) 01/23/2021   HGB 14.8 01/23/2021   HCT 45.8 01/23/2021   MCV 84.3 01/23/2021   PLT 297 01/23/2021   Recent Labs    01/21/21 0946 01/22/21 0609 01/23/21 0613  NA 141 140 134*  K 4.7 4.2 4.0  CL 105 102 98  CO2 25  23 24  GLUCOSE 86 86 113*  BUN 43* 28* 24*  CREATININE 5.72* 2.86* 2.16*  CALCIUM 9.3 9.6 9.1  GFRNONAA 11* 25* 35*    RADIOGRAPHIC STUDIES: I have personally reviewed the radiological images as listed and agreed with the findings in the report. CT Renal Stone Study  Result Date: 01/21/2021 CLINICAL DATA:  Bladder neck obstruction, acute renal failure, urinary frequency EXAM: CT ABDOMEN AND PELVIS WITHOUT CONTRAST TECHNIQUE: Multidetector CT imaging of the abdomen and pelvis was performed following the standard protocol without IV contrast. RADIATION DOSE REDUCTION: This exam was performed according to the departmental dose-optimization program which includes automated exposure control, adjustment of the mA and/or kV according to patient size and/or use of iterative reconstruction technique. COMPARISON:  None. FINDINGS: Lower chest: No acute abnormality. Hepatobiliary: No solid liver abnormality is seen. No gallstones, gallbladder wall thickening, or biliary dilatation. Pancreas: Unremarkable. No pancreatic ductal dilatation or surrounding inflammatory changes. Spleen: Splenomegaly, maximum span 16.4 cm. Adrenals/Urinary Tract: Adrenal glands are unremarkable. Severe bilateral hydronephrosis and hydroureter to the ureterovesicular junctions. Small nonobstructive calculus of the posterior midportion of the left kidney (series 2, image 34). Distended urinary bladder extending well into the abdomen, measuring at least 20 cm. Stomach/Bowel: Stomach is within normal limits. Appendix appears normal. No evidence of bowel wall thickening,  distention, or inflammatory changes. Descending and sigmoid diverticulosis. Vascular/Lymphatic: No significant vascular findings are present. Numerous prominent retroperitoneal and small bowel mesenteric lymph nodes, largest mesenteric nodes measuring up to 2.2 x 1.2 cm (series 2, image 35). Reproductive: Severe prostatomegaly with median lobe hypertrophy. Other: No abdominal wall hernia or abnormality. No ascites. Musculoskeletal: No acute or significant osseous findings. IMPRESSION: 1. Severe bilateral hydronephrosis and hydroureter to the ureterovesicular junctions. Distended urinary bladder extending well into the abdomen, measuring at least 20 cm. Findings are consistent with bladder outlet obstruction in the setting of severe prostatomegaly. 2. Small nonobstructive calculus of the posterior midportion of the left kidney. 3. Numerous prominent retroperitoneal and small bowel mesenteric lymph nodes, largest mesenteric nodes measuring up to 2.2 x 1.2 cm. These are nonspecific and may be reactive, although lymphoproliferative disorder is not excluded. Consider CT follow-up in 3 months. 4. Splenomegaly. Electronically Signed   By: Delanna Ahmadi M.D.   On: 01/21/2021 35:58    #56 year old male patient with history of multiple sclerosis-is currently admitted to hospital for acute urinary retention/bilateral hydronephrosis-noted to have incidental elevated white count/lymphocytosis  #Elevated white count/lymphocytosis normal hemoglobin platelets/moderate splenomegaly -reviewed the peripheral smear highly suggestive of chronic lymphoproliferative disorder.  Approximately 2 cm mesenteric lymphadenopathy/along with prominent retroperitoneal adenopathy.  #Bilateral hydronephrosis/prostatomegaly-   #Multiple sclerosis-clinically stable not on treatment.  Recommendations:  #Recommend peripheral blood flow cytometry-for diagnostic purpose.  I discussed that a bone marrow biopsy is currently not recommended in the  context of his acute issues/the fact this patient is asymptomatic from likely underlying lymphoproliferative disorder.  Patient understands that he would not recommend any therapy for lymphoproliferative disorder unless patient is symptomatic.  # Thank you Dr.Shah for allowing me to participate in the care of your pleasant patient. Please do not hesitate to contact me with questions or concerns in the interim. All questions were answered-the plan of care was discussed with the patient's sister by the bedside. The patient knows to call the clinic with any problems, questions or concerns.    Cammie Sickle, MD 01/23/2021 9:28 PM

## 2021-01-23 NOTE — Progress Notes (Signed)
Progress Note   Patient: Ralph [name] EXB:[id number] DOB: 04-13-1965 DOA: 01/21/2021     2 DOS: the patient was seen and examined on 01/23/2021   Brief hospital course: 56 y.o. male with medical history significant of hyperlipidemia, prediabetes, MS and low-grade prostate cancer who presented to the ED from home due to ongoing urinary frequency for about 1 week.  He had seen his urologist and was started on Cipro for presumed UTI.  Patient reports he took that for 6 days and did not get any better.  No fevers or chills.  Urine culture from that visit is now negative.  Patient found to be in acute renal failure with creatinine 5.72.  CT scan showed severe bilateral hydronephrosis with massive bladder distention in the setting of severe prostatomegaly.    Admitted to medicine service.   Urology and nephrology are consulted.   Foley catheter was placed by urology on admission.  Renal function improving.  1/18: Oncology consult as WBC creeping up.  Checking LDH and flow cytometry panel per oncology with plan for discharge tomorrow   Assessment and Plan * AKI (acute kidney injury) ([place])- (present on admission) Present on admission with creatinine 5.72. Due to postobstructive uropathy with bladder outlet obstruction secondary to enlarged prostate. Coude catheter placed by urology 1/16.  Improving, Cr 2.16 today.       Obstructive uropathy- (present on admission) As outlined above  Leukocytosis- (present on admission) Presented with WBC 29.5-> 44.2.  Hx of atypical lymphocytosis, may have a lymphoproliferative disorder.  No signs of infection, but on empiric Rocephin for now.  Afebrile.  Urinalysis was completely negative on admission.  He had taken 6 days of Cipro as an outpatient after seen for urinary frequency which turns out was related to obstruction. -- Monitor CBC -- Monitor fever curve and for other signs of infection -Oncology Dr. Rogue [name] has ordered flow cytometry and  LDH today  Bladder outlet obstruction- (present on admission) Due to enlarged prostate, leading to acute renal failure.  CT abdomen pelvis showed a massively distended bladder, bilateral hydronephrosis..  Patient does have a history of low-grade prostate cancer and elevated PSA, under surveillance. Appears no recent follow up. -- Urology following, appreciate recommendations: Allow Foley to drain for at least 7 days. -- Continue Flomax and finasteride at discharge. --Likely will require procedure as outpatient to address prostate.  Outpatient follow-up with Dr. Ricky [name]  Atypical lymphocytosis- (present on admission) Hx of this per chart review back in 2018, appears pt has not followed up.  WBC on admission 29.5 >> 44.2 today.  No fevers or evidence of infection.   Imaging studies demonstrating significant lymphadenopathy, concerning for an evolving lymphoproliferative disorder. -- Oncology has ordered flow cytometry with lymphoma/leukemia panel and LDH today -He will need outpatient follow-up with cancer center  Hyperlipidemia- (present on admission) Had not been taking his statin. Lipid profile: total chol 256, HDL 29, LDL 200, normal TG's (worse than prior 4 years ago). -- Continue Crestor.   --Send prescription at discharge. --PCP follow up at discharge    Pre-diabetes- (present on admission) Last Hbg A1c in 2018 was 5.8%. Repeat Hbg A1c is 6.3%. --Sliding scale insulin if needed  MS (multiple sclerosis) ([place])- (present on admission) Chronic.  Patient reports no deficits, physical limitations or complications  Prostate cancer [facility])- (present on admission) Per chart review, PCP note from July 2018: "known low grade prostate cancer, lost to follow-up for past 2 years... - Previously established with [facility] Dr  Ralph [name], last visit 01/2014 - Prior PSA trend with elevated PSA and s/p biopsy dx in 2013" Prostate enlargement now causing bladder outlet obstruction.  PSA  checked 9.86. --Urology follow up as an outpatient --Oncology follow up at the cancer center as an outpatient   Elevated PSA- (present on admission) PSA is 9.86.  Last time checked was in 2018, PSA 8.31 at that time.  Urology and oncology follow up. Dr. Rogue [name] seen him today and checking flow cytometry and LDH.  Outpatient follow-up at cancer center    Subjective: No new complaints.  Waiting for oncology input.  Mother at bedside  Objective Vital signs were reviewed and unremarkable.  General exam: awake, alert, no acute distress HEENT: atraumatic, clear conjunctiva, anicteric sclera, moist mucus membranes, hearing grossly normal  Respiratory system: CTAB, no wheezes, rales or rhonchi, normal respiratory effort. Cardiovascular system: normal S1/S2,  RRR, no JVD, murmurs, rubs, gallops,  no pedal edema.   Genitourinary system: Foley in place with pale yellow urine in the bag. Central nervous system: A&O x4. no gross focal neurologic deficits, normal speech Extremities: moves all , no edema, normal tone Skin: dry, intact, normal temperature Psychiatry: normal mood, congruent affect, judgement and insight appear normal  Data Reviewed:  Leukocytosis worsening, improving creatinine  Family Communication: Updated mother at bedside  Disposition: Status is: Inpatient  Remains inpatient appropriate because: Getting flow cytometry and LDH checked today per oncology    DVT prophylaxis -subcu heparin  Plan for discharge tomorrow morning    Time spent: 35 minutes  Author: Max Sane, MD 01/23/2021 1:31 PM  For on call review [email or web address].

## 2021-01-23 NOTE — Progress Notes (Addendum)
[facility]  ROUNDING NOTE   Subjective:   Patient resting quietly Discomfort from foley catheter Denies shortness of breath No edema  Foley in place with hematuria Urine output 5.3L in past 24 hours  Objective:  Vital signs in last 24 hours:  Temp:  [97.5 F (36.4 C)-98.6 F (37 C)] 97.5 F (36.4 C) (01/18 0734) Pulse Rate:  [87-109] 97 (01/18 0734) Resp:  [16-18] 18 (01/18 0734) BP: (117-141)/(85-113) 126/96 (01/18 0734) SpO2:  [96 %-98 %] 98 % (01/18 0734)  Weight change:  Filed Weights   01/21/21 0945 01/21/21 1806  Weight: 115.7 kg 118.2 kg    Intake/Output: I/O last 3 completed shifts: In: 740 [P.O.:740] Out: 9800 [Urine:9800]   Intake/Output this shift:  No intake/output data recorded.  Physical Exam: General: NAD, resting in bed  Head: Normocephalic, atraumatic. Moist oral mucosal membranes  Eyes: Anicteric  Lungs:  Clear to auscultation, normal effort  Heart: Regular rate and rhythm  Abdomen:  Soft, nontender  Extremities: No peripheral edema.  Neurologic: Nonfocal, moving all four extremities  Skin: No lesions  GU Foley cath-gross hematuria    Basic Metabolic Panel: Recent Labs  Lab 01/21/21 0946 01/22/21 0609 01/23/21 0613  NA 141 140 134*  K 4.7 4.2 4.0  CL 105 102 98  CO2 25 23 24   GLUCOSE 86 86 113*  BUN 43* 28* 24*  CREATININE 5.72* 2.86* 2.16*  CALCIUM 9.3 9.6 9.1  PHOS  --  5.2* 5.0*     Liver Function Tests: No results for input(s): AST, ALT, ALKPHOS, BILITOT, PROT, ALBUMIN in the last 168 hours. No results for input(s): LIPASE, AMYLASE in the last 168 hours. No results for input(s): AMMONIA in the last 168 hours.  CBC: Recent Labs  Lab 01/21/21 0946 01/22/21 0609 01/22/21 0611 01/23/21 0613  WBC 29.5* 36.3* 36.2* 44.2*  NEUTROABS  --   --  7.5 7.2  HGB 12.7* 14.4 14.5 14.8  HCT 38.8* 44.0 44.8 45.8  MCV 86.0 84.6 85.7 84.3  PLT 248 267 273 297     Cardiac Enzymes: No results for input(s):  CKTOTAL, CKMB, CKMBINDEX, TROPONINI in the last 168 hours.  BNP: Invalid input(s): POCBNP  CBG: Recent Labs  Lab 01/21/21 9163  [id number] 93     Microbiology: Results for orders placed or performed during the hospital encounter of 01/21/21  Urine Culture     Status: None   Collection Time: 01/21/21  9:46 AM   Specimen: Urine, Random  Result Value Ref Range Status   Specimen Description   Final    URINE, RANDOM Performed at [facility], [address], [place], [place] [address]    Special Requests   Final    NONE Performed at [facility], [address], [place], [place] [address]    Culture   Final    NO GROWTH Performed at [facility], [place] [address] [place], [place] [address]    Report Status 01/22/2021 FINAL  Final  Resp Panel by RT-PCR (Flu A&B, Covid) Nasopharyngeal Swab     Status: None   Collection Time: 01/21/21 10:41 AM   Specimen: Nasopharyngeal Swab; Nasopharyngeal(NP) swabs in vial transport medium  Result Value Ref Range Status   SARS Coronavirus 2 by RT PCR NEGATIVE NEGATIVE Final    Comment: (NOTE) SARS-CoV-2 target nucleic acids are NOT DETECTED.  The SARS-CoV-2 RNA is generally detectable in upper respiratory specimens during the acute phase of infection. The lowest concentration of SARS-CoV-2 viral  copies this assay can detect is 138 copies/mL. A negative result does not preclude SARS-Cov-2 infection and should not be used as the sole basis for treatment or other patient management decisions. A negative result may occur with  improper specimen collection/handling, submission of specimen other than nasopharyngeal swab, presence of viral mutation(s) within the areas targeted by this assay, and inadequate number of viral copies(<138 copies/mL). A negative result must be combined with clinical observations, patient history, and epidemiological information. The expected result is Negative.  Fact Sheet for Patients:   EntrepreneurPulse.com.au  Fact Sheet for Healthcare Providers:  IncredibleEmployment.be  This test is no t yet approved or cleared by the [place] FDA and  has been authorized for detection and/or diagnosis of SARS-CoV-2 by FDA under an Emergency Use Authorization (EUA). This EUA will remain  in effect (meaning this test can be used) for the duration of the COVID-19 declaration under Section 564(b)(1) of the Act, 21 U.S.C.section 360bbb-3(b)(1), unless the authorization is terminated  or revoked sooner.       Influenza A by PCR NEGATIVE NEGATIVE Final   Influenza B by PCR NEGATIVE NEGATIVE Final    Comment: (NOTE) The Xpert Xpress SARS-CoV-2/FLU/RSV plus assay is intended as an aid in the diagnosis of influenza from Nasopharyngeal swab specimens and should not be used as a sole basis for treatment. Nasal washings and aspirates are unacceptable for Xpert Xpress SARS-CoV-2/FLU/RSV testing.  Fact Sheet for Patients: EntrepreneurPulse.com.au  Fact Sheet for Healthcare Providers: IncredibleEmployment.be  This test is not yet approved or cleared by the [place] FDA and has been authorized for detection and/or diagnosis of SARS-CoV-2 by FDA under an Emergency Use Authorization (EUA). This EUA will remain in effect (meaning this test can be used) for the duration of the COVID-19 declaration under Section 564(b)(1) of the Act, 21 U.S.C. section 360bbb-3(b)(1), unless the authorization is terminated or revoked.  Performed at [facility], [address] [place], [place] [address]     Coagulation Studies: No results for input(s): LABPROT, INR in the last 72 hours.  Urinalysis: Recent Labs    01/21/21 0946  COLORURINE YELLOW  LABSPEC 1.010  PHURINE 5.5  GLUCOSEU NEGATIVE  HGBUR TRACE*  BILIRUBINUR NEGATIVE  KETONESUR NEGATIVE  PROTEINUR NEGATIVE  NITRITE NEGATIVE  LEUKOCYTESUR  NEGATIVE       Imaging: CT Renal Stone Study  Result Date: 01/21/2021 CLINICAL DATA:  Bladder neck obstruction, acute renal failure, urinary frequency EXAM: CT ABDOMEN AND PELVIS WITHOUT CONTRAST TECHNIQUE: Multidetector CT imaging of the abdomen and pelvis was performed following the standard protocol without IV contrast. RADIATION DOSE REDUCTION: This exam was performed according to the departmental dose-optimization program which includes automated exposure control, adjustment of the mA and/or kV according to patient size and/or use of iterative reconstruction technique. COMPARISON:  None. FINDINGS: Lower chest: No acute abnormality. Hepatobiliary: No solid liver abnormality is seen. No gallstones, gallbladder wall thickening, or biliary dilatation. Pancreas: Unremarkable. No pancreatic ductal dilatation or surrounding inflammatory changes. Spleen: Splenomegaly, maximum span 16.4 cm. Adrenals/Urinary Tract: Adrenal glands are unremarkable. Severe bilateral hydronephrosis and hydroureter to the ureterovesicular junctions. Small nonobstructive calculus of the posterior midportion of the left kidney (series 2, image 34). Distended urinary bladder extending well into the abdomen, measuring at least 20 cm. Stomach/Bowel: Stomach is within normal limits. Appendix appears normal. No evidence of bowel wall thickening, distention, or inflammatory changes. Descending and sigmoid diverticulosis. Vascular/Lymphatic: No significant vascular findings are present. Numerous prominent retroperitoneal and small bowel mesenteric lymph  nodes, largest mesenteric nodes measuring up to 2.2 x 1.2 cm (series 2, image 35). Reproductive: Severe prostatomegaly with median lobe hypertrophy. Other: No abdominal wall hernia or abnormality. No ascites. Musculoskeletal: No acute or significant osseous findings. IMPRESSION: 1. Severe bilateral hydronephrosis and hydroureter to the ureterovesicular junctions. Distended urinary bladder  extending well into the abdomen, measuring at least 20 cm. Findings are consistent with bladder outlet obstruction in the setting of severe prostatomegaly. 2. Small nonobstructive calculus of the posterior midportion of the left kidney. 3. Numerous prominent retroperitoneal and small bowel mesenteric lymph nodes, largest mesenteric nodes measuring up to 2.2 x 1.2 cm. These are nonspecific and may be reactive, although lymphoproliferative disorder is not excluded. Consider CT follow-up in 3 months. 4. Splenomegaly. Electronically Signed   By: Delanna Ahmadi M.D.   On: 01/21/2021 11:04     Medications:    cefTRIAXone (ROCEPHIN)  IV 1 g (01/22/21 1049)    Chlorhexidine Gluconate Cloth  6 each Topical Daily   finasteride  5 mg Oral Daily   heparin  5,000 Units Subcutaneous Q8H   rosuvastatin  5 mg Oral QHS   tamsulosin  0.4 mg Oral Daily   acetaminophen **OR** acetaminophen, bisacodyl, ondansetron **OR** ondansetron (ZOFRAN) IV, senna-docusate  Assessment/ Plan:  Mr. DEWIE AHART is a 56 y.o.  male with history of prostate cancer, hyperlipidemia, and remote history of multiple sclerosis who was admitted to [facility] on 01/21/2021 for Acute renal failure ([place]) [N17.9] Bladder outlet obstruction [N32.0] Acute renal failure, unspecified acute renal failure type ([place]) [N17.9]   Acute Kidney Injury with baseline creatinine 1.04 and GFR of >60 on 07/11/16.  Acute kidney injury secondary to urinary retention due to enlarged prostate Foley placed by urology.  -Creatinine continues to improve. Adequate urine output recorded.   -Will order outpatient follow-up with nephrology to monitor renal function recovery.  Patient cleared to discharge from renal stance  Lab Results  Component Value Date   CREATININE 2.16 (H) 01/23/2021   CREATININE 2.86 (H) 01/22/2021   CREATININE 5.72 (H) 01/21/2021    Intake/Output Summary (Last 24 hours) at 01/23/2021 0958 Last data filed at 01/23/2021 0501 Gross per 24  hour  Intake --  Output 5300 ml  Net -5300 ml    2.  Obstructive uropathy treated with Foley catheter insertion.  Urology following and will follow patient outpatient. Foley will remain at discharge  3.  Urinary tract infection with leukocytosis and failed outpatient p.o. antibiotics.  Currently Rocephin. Recommend consulting Hematology for increasing leukocytosis.     LOS: 2 Alvenia Treese 1/18/20239:58 AM

## 2021-01-24 DIAGNOSIS — E7800 Pure hypercholesterolemia, unspecified: Secondary | ICD-10-CM

## 2021-01-24 LAB — RENAL FUNCTION PANEL
Albumin: 3.8 g/dL (ref 3.5–5.0)
Anion gap: 12 (ref 5–15)
BUN: 21 mg/dL — ABNORMAL HIGH (ref 6–20)
CO2: 26 mmol/L (ref 22–32)
Calcium: 9 mg/dL (ref 8.9–10.3)
Chloride: 102 mmol/L (ref 98–111)
Creatinine, Ser: 1.88 mg/dL — ABNORMAL HIGH (ref 0.61–1.24)
GFR, Estimated: 41 mL/min — ABNORMAL LOW (ref >60)
Glucose, Bld: 115 mg/dL — ABNORMAL HIGH (ref 70–99)
Phosphorus: 4 mg/dL (ref 2.5–4.6)
Potassium: 4.2 mmol/L (ref 3.5–5.1)
Sodium: 140 mmol/L (ref 135–145)

## 2021-01-24 LAB — CBC
HCT: 46.3 % (ref 39.0–52.0)
Hemoglobin: 14.9 g/dL (ref 13.0–17.0)
MCH: 26.8 pg (ref 26.0–34.0)
MCHC: 32.2 g/dL (ref 30.0–36.0)
MCV: 83.4 fL (ref 80.0–100.0)
Platelets: 290 10*3/uL (ref 150–400)
RBC: 5.55 MIL/uL (ref 4.22–5.81)
RDW: 13.9 % (ref 11.5–15.5)
WBC: 50.8 10*3/uL (ref 4.0–10.5)
nRBC: 0 % (ref 0.0–0.2)

## 2021-01-24 NOTE — TOC Progression Note (Signed)
Transition of Care [facility]) - Progression Note    Patient Details  Name: SAJAD GLANDER MRN: [id number] Date of Birth: August 27, 1965  [facility] CM/SW [place], RN Phone Number: 01/24/2021, 10:10 AM  Clinical Narrative:   Patient discharging home today.  At this time, he feels competent to care for his foley catheter and refuses home health.  No other needs indicated.    Expected Discharge Plan: Home/Self Care Barriers to Discharge: Continued Medical Work up  Expected Discharge Plan and Services Expected Discharge Plan: Home/Self Care   Discharge Planning Services: CM Consult Post Acute Care Choice:  (foley catheter) Living arrangements for the past 2 months: Single Family Home Expected Discharge Date: 01/24/21                                     Social Determinants of Health (SDOH) Interventions    Readmission Risk Interventions No flowsheet data found.

## 2021-01-24 NOTE — Telephone Encounter (Signed)
Patient has been scheduled, spoke with him to confirm.   Thanks, [employer]

## 2021-01-24 NOTE — Progress Notes (Signed)
Patient verbalized understanding of all discharge instructions including need for follow up with urology for voiding trial.

## 2021-01-24 NOTE — Addendum Note (Signed)
Addended by: Vanice Sarah on: 01/24/2021 08:25 AM   Modules accepted: Orders

## 2021-01-24 NOTE — Telephone Encounter (Signed)
Lab orders entered

## 2021-01-24 NOTE — Progress Notes (Addendum)
[facility]  ROUNDING NOTE   Subjective:   Patient resting comfortably, alert and oriented Denies shortness of breath Denies pain and discomfort No peripheral edema  Foley remains in place with hematuria Urine output 3.9L in past 24 hours  Objective:  Vital signs in last 24 hours:  Temp:  [97.9 F (36.6 C)-98.8 F (37.1 C)] 98 F (36.7 C) (01/19 0728) Pulse Rate:  [84-99] 84 (01/19 0728) Resp:  [17-18] 17 (01/19 0728) BP: (131-141)/(90-97) 136/92 (01/19 0728) SpO2:  [95 %-96 %] 96 % (01/19 0728)  Weight change:  Filed Weights   01/21/21 0945 01/21/21 1806  Weight: 115.7 kg 118.2 kg    Intake/Output: I/O last 3 completed shifts: In: 225.4 [IV Piggyback:225.4] Out: 6200 [Urine:6200]   Intake/Output this shift:  No intake/output data recorded.  Physical Exam: General: NAD, resting in bed  Head: Normocephalic, atraumatic. Moist oral mucosal membranes  Eyes: Anicteric  Lungs:  Clear to auscultation, normal effort  Heart: Regular rate and rhythm  Abdomen:  Soft, nontender  Extremities: No peripheral edema.  Neurologic: Nonfocal, moving all four extremities  Skin: No lesions  GU Foley cath-improved hematuria    Basic Metabolic Panel: Recent Labs  Lab 01/21/21 0946 01/22/21 0609 01/23/21 0613 01/24/21 0825  NA 141 140 134* 140  K 4.7 4.2 4.0 4.2  CL 105 102 98 102  CO2 25 23 24 26   GLUCOSE 86 86 113* 115*  BUN 43* 28* 24* 21*  CREATININE 5.72* 2.86* 2.16* 1.88*  CALCIUM 9.3 9.6 9.1 9.0  PHOS  --  5.2* 5.0* 4.0     Liver Function Tests: Recent Labs  Lab 01/24/21 0825  ALBUMIN 3.8   No results for input(s): LIPASE, AMYLASE in the last 168 hours. No results for input(s): AMMONIA in the last 168 hours.  CBC: Recent Labs  Lab 01/21/21 0946 01/22/21 0609 01/22/21 0611 01/23/21 0613 01/24/21 0825  WBC 29.5* 36.3* 36.2* 44.2* 50.8*  NEUTROABS  --   --  7.5 7.2  --   HGB 12.7* 14.4 14.5 14.8 14.9  HCT 38.8* 44.0 44.8 45.8 46.3   MCV 86.0 84.6 85.7 84.3 83.4  PLT 248 267 273 297 290     Cardiac Enzymes: No results for input(s): CKTOTAL, CKMB, CKMBINDEX, TROPONINI in the last 168 hours.  BNP: Invalid input(s): POCBNP  CBG: Recent Labs  Lab 01/21/21 7616  WVPXTG 62     Microbiology: Results for orders placed or performed during the hospital encounter of 01/21/21  Urine Culture     Status: None   Collection Time: 01/21/21  9:46 AM   Specimen: Urine, Random  Result Value Ref Range Status   Specimen Description   Final    URINE, RANDOM Performed at [facility], [address] [place], [place] [address]    Special Requests   Final    NONE Performed at [facility], [address], [place], [place] [address]    Culture   Final    NO GROWTH Performed at [facility], [place] [address], [place], [place] [address]    Report Status 01/22/2021 FINAL  Final  Resp Panel by RT-PCR (Flu A&B, Covid) Nasopharyngeal Swab     Status: None   Collection Time: 01/21/21 10:41 AM   Specimen: Nasopharyngeal Swab; Nasopharyngeal(NP) swabs in vial transport medium  Result Value Ref Range Status   SARS Coronavirus 2 by RT PCR NEGATIVE NEGATIVE Final    Comment: (NOTE) SARS-CoV-2 target nucleic acids are NOT DETECTED.  The SARS-CoV-2 RNA is generally detectable in upper respiratory specimens during the acute phase of infection. The lowest concentration of SARS-CoV-2 viral copies this assay can detect is 138 copies/mL. A negative result does not preclude SARS-Cov-2 infection and should not be used as the sole basis for treatment or other patient management decisions. A negative result may occur with  improper specimen collection/handling, submission of specimen other than nasopharyngeal swab, presence of viral mutation(s) within the areas targeted by this assay, and inadequate number of viral copies(<138 copies/mL). A negative result must be combined with clinical observations, patient  history, and epidemiological information. The expected result is Negative.  Fact Sheet for Patients:  EntrepreneurPulse.com.au  Fact Sheet for Healthcare Providers:  IncredibleEmployment.be  This test is no t yet approved or cleared by the [place] FDA and  has been authorized for detection and/or diagnosis of SARS-CoV-2 by FDA under an Emergency Use Authorization (EUA). This EUA will remain  in effect (meaning this test can be used) for the duration of the COVID-19 declaration under Section 564(b)(1) of the Act, 21 U.S.C.section 360bbb-3(b)(1), unless the authorization is terminated  or revoked sooner.       Influenza A by PCR NEGATIVE NEGATIVE Final   Influenza B by PCR NEGATIVE NEGATIVE Final    Comment: (NOTE) The Xpert Xpress SARS-CoV-2/FLU/RSV plus assay is intended as an aid in the diagnosis of influenza from Nasopharyngeal swab specimens and should not be used as a sole basis for treatment. Nasal washings and aspirates are unacceptable for Xpert Xpress SARS-CoV-2/FLU/RSV testing.  Fact Sheet for Patients: EntrepreneurPulse.com.au  Fact Sheet for Healthcare Providers: IncredibleEmployment.be  This test is not yet approved or cleared by the [place] FDA and has been authorized for detection and/or diagnosis of SARS-CoV-2 by FDA under an Emergency Use Authorization (EUA). This EUA will remain in effect (meaning this test can be used) for the duration of the COVID-19 declaration under Section 564(b)(1) of the Act, 21 U.S.C. section 360bbb-3(b)(1), unless the authorization is terminated or revoked.  Performed at [facility], [address] [place], [place] [address]     Coagulation Studies: No results for input(s): LABPROT, INR in the last 72 hours.  Urinalysis: Recent Labs    01/21/21 0946  COLORURINE YELLOW  LABSPEC 1.010  PHURINE 5.5  GLUCOSEU NEGATIVE  HGBUR  TRACE*  BILIRUBINUR NEGATIVE  KETONESUR NEGATIVE  PROTEINUR NEGATIVE  NITRITE NEGATIVE  LEUKOCYTESUR NEGATIVE       Imaging: No results found.   Medications:    cefTRIAXone (ROCEPHIN)  IV 1 g (01/24/21 0901)    Chlorhexidine Gluconate Cloth  6 each Topical Daily   finasteride  5 mg Oral Daily   heparin  5,000 Units Subcutaneous Q8H   rosuvastatin  5 mg Oral QHS   tamsulosin  0.4 mg Oral Daily   acetaminophen **OR** acetaminophen, bisacodyl, ondansetron **OR** ondansetron (ZOFRAN) IV, senna-docusate  Assessment/ Plan:  [name] is a 56 y.o.  male with history of prostate cancer, hyperlipidemia, and remote history of multiple sclerosis who was admitted to [facility] on 01/21/2021 for Acute renal failure ([place]) [N17.9] Bladder outlet obstruction [N32.0] Acute renal failure, unspecified acute renal failure type ([place]) [N17.9]   Acute Kidney Injury with baseline creatinine 1.04 and GFR of >60 on 07/11/16.  Acute kidney injury secondary to urinary retention due to enlarged prostate Foley placed by urology.  -Creatinine greatly improved since admission.  Adequate urine output recorded in previous 24 hours.  Patient cleared to discharge from  renal stance.  We will schedule follow-up appointment with our office.   Lab Results  Component Value Date   CREATININE 1.88 (H) 01/24/2021   CREATININE 2.16 (H) 01/23/2021   CREATININE 2.86 (H) 01/22/2021    Intake/Output Summary (Last 24 hours) at 01/24/2021 0926 Last data filed at 01/24/2021 0600 Gross per 24 hour  Intake 225.39 ml  Output 3900 ml  Net -3674.61 ml    2.  Obstructive uropathy treated with Foley catheter insertion.  Urology following and will follow patient outpatient. Foley to remain in place at discharge  3.  Urinary tract infection with leukocytosis and failed outpatient p.o. antibiotics.  Currently Rocephin.  Hematology consulted due to increased leukocytosis.  Previous testing suggestive of chronic  lymphoproliferative disease.  Additional testing ordered.  Patient will follow-up with hematology outpatient.    LOS: 3 Ledonna Dormer 1/19/20239:26 AM

## 2021-01-24 NOTE — Discharge Summary (Signed)
Physician Discharge Summary   Patient: Ralph [name] MRN: [id number] DOB: October 02, 1965  Admit date:     01/21/2021  Discharge date: 01/24/21  Discharge Physician: Max Sane   PCP: [place]   Recommendations at discharge:   Follow-up with outpatient providers as requested  Discharge Diagnoses Principal Problem:   Acute renal failure ([place]) Active Problems:   Elevated PSA   Prostate cancer (HCC)   MS (multiple sclerosis) (HCC)   Pre-diabetes   Hyperlipidemia   Atypical lymphocytosis   Bladder outlet obstruction   Leukocytosis   Obstructive uropathy   Hospital Course   57 y.o. male with medical history significant of hyperlipidemia, prediabetes, MS and low-grade prostate cancer who presented to the ED from home due to ongoing urinary frequency for about 1 week.  He had seen his urologist and was started on Cipro for presumed UTI.  Patient reports he took that for 6 days and did not get any better.  No fevers or chills.  Urine culture from that visit is now negative.  Patient found to be in acute renal failure with creatinine 5.72.  CT scan showed severe bilateral hydronephrosis with massive bladder distention in the setting of severe prostatomegaly.    Admitted to medicine service.   Urology and nephrology are consulted.   Foley catheter was placed by urology on admission.  Renal function improving.  1/18: Oncology consult as WBC creeping up.  Checking LDH and flow cytometry panel per oncology with plan for discharge tomorrow  * Acute renal failure ([place]) Present on admission with creatinine 5.72. Due to postobstructive uropathy with bladder outlet obstruction secondary to enlarged prostate. Coude catheter placed by urology 1/16.  Improving, Cr 1.88 on the day of discharge -Patient to be discharged with Foley catheter and has to be training for at least 7 days for maximal urinary decompression in the setting of bilateral hydronephrosis per urology. -Continue  Flomax and finasteride at discharge. -Ultimately he will need an outlet procedure possibly HoLEP or simple open prostatectomy for which he will need follow-up with Dr. [name]   Obstructive uropathy- (present on admission) As outlined above  Leukocytosis/atypical lymphocytosis- (present on admission) Presented with WBC 29.5-> 50.8.  Discussed with oncology who has seen him while in the hospital.  Highly suggestive of chronic lymphoproliferative disorder. -Oncology Dr. Rogue Bussing has ordered flow cytometry and LDH and he will need follow-up at oncology clinic as an outpatient  Bladder outlet obstruction- (present on admission) Due to enlarged prostate, leading to acute renal failure.  CT abdomen pelvis showed a massively distended bladder, bilateral hydronephrosis..  Patient does have a history of low-grade prostate cancer and elevated PSA, under surveillance. Appears no recent follow up. -- Urology following, appreciate recommendations: Allow Foley to drain for at least 7 days. -- Continue Flomax and finasteride at discharge. --Likely will require procedure as outpatient to address prostate.  Outpatient follow-up with Dr. [name]  Hyperlipidemia- (present on admission) Continue Crestor  Pre-diabetes- (present on admission) Last Hbg A1c in 2018 was 5.8%. Repeat Hbg A1c is 6.3%. --Sliding scale insulin while in the hospital  MS (multiple sclerosis) ([place])- (present on admission) Chronic.  Patient reports no deficits, physical limitations or complications  Prostate cancer [facility])- (present on admission) Per chart review, PCP note from July 2018: "known low grade prostate cancer, lost to follow-up for past 2 years... - Previously established with [facility] Dr [name], last visit 01/2014 - Prior PSA trend with elevated PSA and s/p biopsy dx in 2013" Prostate  enlargement now causing bladder outlet obstruction.  PSA checked 9.86. --Urology follow up as an outpatient --Oncology  follow up at the cancer center as an outpatient   Elevated PSA- (present on admission) PSA is 9.86.  Last time checked was in 2018, PSA 8.31 at that time.  Urology and oncology follow up. Dr. Rogue Bussing recommends outpatient follow-up at cancer center   Consultants: Urology, oncology, nephrology Procedures performed: Foley placement Disposition: Home Diet recommendation: Carb modified diet  DISCHARGE MEDICATION: Allergies as of 01/24/2021   No Known Allergies      Medication List     STOP taking these medications    ciprofloxacin 500 MG tablet Commonly known as: CIPRO   methocarbamol 500 MG tablet Commonly known as: ROBAXIN       TAKE these medications    finasteride 5 MG tablet Commonly known as: PROSCAR Take 1 tablet (5 mg total) by mouth daily.   rosuvastatin 5 MG tablet Commonly known as: CRESTOR Take 1 tablet (5 mg total) by mouth at bedtime. What changed: additional instructions   tamsulosin 0.4 MG Caps capsule Commonly known as: FLOMAX Take 1 capsule (0.4 mg total) by mouth daily.        Follow-up Information     [place] Schedule an appointment as soon as possible for a visit in 1 week(s).   Why: Patient to make own follow up no Dr listed        [facility] Discharge F/UP Contact information: [place] [address] [phone]         Cammie Sickle, MD. Go on 02/11/2021.   Specialties: Internal Medicine, Oncology Why: 3:30pm Contact information: [place] [address] [phone]         Thomes Lolling, MD. Schedule an appointment as soon as possible for a visit in 1 week(s).   Specialty: Urology Why: Patient needs to make own follow up appt. No answer was an answering  [facility] Discharge F/UP Contact information: [id number] [facility] [place] [address] [phone]         Lavonia Dana, MD. Schedule an appointment as  soon as possible for a visit in 2 week(s).   Specialty: Nephrology Why: No answer Patient to make own follow up appt Contact information: [place] [address] [phone]                 Discharge Exam: Danley Danker Weights   01/21/21 0945 01/21/21 1806  Weight: 115.7 kg 118.2 kg    General exam: awake, alert, no acute distress HEENT: atraumatic, clear conjunctiva, anicteric sclera, moist mucus membranes, hearing grossly normal  Respiratory system: CTAB, no wheezes, rales or rhonchi, normal respiratory effort. Cardiovascular system: normal S1/S2,  RRR, no JVD, murmurs, rubs, gallops,  no pedal edema.   Genitourinary system: Foley in place with pale yellow urine in the bag. Central nervous system: A&O x4. no gross focal neurologic deficits, normal speech Extremities: moves all , no edema, normal tone Skin: dry, intact, normal temperature Psychiatry: normal mood, congruent affect, judgement and insight appear normal  Condition at discharge: good  The results of significant diagnostics from this hospitalization (including imaging, microbiology, ancillary and laboratory) are listed below for reference.   Imaging Studies: CT Renal Stone Study  Result Date: 01/21/2021 CLINICAL DATA:  Bladder neck obstruction, acute renal failure, urinary frequency EXAM: CT ABDOMEN AND PELVIS WITHOUT CONTRAST TECHNIQUE: Multidetector CT imaging  of the abdomen and pelvis was performed following the standard protocol without IV contrast. RADIATION DOSE REDUCTION: This exam was performed according to the departmental dose-optimization program which includes automated exposure control, adjustment of the mA and/or kV according to patient size and/or use of iterative reconstruction technique. COMPARISON:  None. FINDINGS: Lower chest: No acute abnormality. Hepatobiliary: No solid liver abnormality is seen. No gallstones, gallbladder wall thickening, or biliary dilatation.  Pancreas: Unremarkable. No pancreatic ductal dilatation or surrounding inflammatory changes. Spleen: Splenomegaly, maximum span 16.4 cm. Adrenals/Urinary Tract: Adrenal glands are unremarkable. Severe bilateral hydronephrosis and hydroureter to the ureterovesicular junctions. Small nonobstructive calculus of the posterior midportion of the left kidney (series 2, image 34). Distended urinary bladder extending well into the abdomen, measuring at least 20 cm. Stomach/Bowel: Stomach is within normal limits. Appendix appears normal. No evidence of bowel wall thickening, distention, or inflammatory changes. Descending and sigmoid diverticulosis. Vascular/Lymphatic: No significant vascular findings are present. Numerous prominent retroperitoneal and small bowel mesenteric lymph nodes, largest mesenteric nodes measuring up to 2.2 x 1.2 cm (series 2, image 35). Reproductive: Severe prostatomegaly with median lobe hypertrophy. Other: No abdominal wall hernia or abnormality. No ascites. Musculoskeletal: No acute or significant osseous findings. IMPRESSION: 1. Severe bilateral hydronephrosis and hydroureter to the ureterovesicular junctions. Distended urinary bladder extending well into the abdomen, measuring at least 20 cm. Findings are consistent with bladder outlet obstruction in the setting of severe prostatomegaly. 2. Small nonobstructive calculus of the posterior midportion of the left kidney. 3. Numerous prominent retroperitoneal and small bowel mesenteric lymph nodes, largest mesenteric nodes measuring up to 2.2 x 1.2 cm. These are nonspecific and may be reactive, although lymphoproliferative disorder is not excluded. Consider CT follow-up in 3 months. 4. Splenomegaly. Electronically Signed   By: Delanna Ahmadi M.D.   On: 01/21/2021 11:04    Microbiology: Results for orders placed or performed during the hospital encounter of 01/21/21  Urine Culture     Status: None   Collection Time: 01/21/21  9:46 AM   Specimen:  Urine, Random  Result Value Ref Range Status   Specimen Description   Final    URINE, RANDOM Performed at [facility], [address], [place], [place] [address]    Special Requests   Final    NONE Performed at [facility], [address], [place], [place] [address]    Culture   Final    NO GROWTH Performed at [facility], [place] [address], [place], [place] [address]    Report Status 01/22/2021 FINAL  Final  Resp Panel by RT-PCR (Flu A&B, Covid) Nasopharyngeal Swab     Status: None   Collection Time: 01/21/21 10:41 AM   Specimen: Nasopharyngeal Swab; Nasopharyngeal(NP) swabs in vial transport medium  Result Value Ref Range Status   SARS Coronavirus 2 by RT PCR NEGATIVE NEGATIVE Final    Comment: (NOTE) SARS-CoV-2 target nucleic acids are NOT DETECTED.  The SARS-CoV-2 RNA is generally detectable in upper respiratory specimens during the acute phase of infection. The lowest concentration of SARS-CoV-2 viral copies this assay can detect is 138 copies/mL. A negative result does not preclude SARS-Cov-2 infection and should not be used as the sole basis for treatment or other patient management decisions. A negative result may occur with  improper specimen collection/handling, submission of specimen other than nasopharyngeal swab, presence of viral mutation(s) within the areas targeted by this assay, and inadequate number of viral copies(<138 copies/mL). A negative result must be combined with clinical  observations, patient history, and epidemiological information. The expected result is Negative.  Fact Sheet for Patients:  EntrepreneurPulse.com.au  Fact Sheet for Healthcare Providers:  IncredibleEmployment.be  This test is no t yet approved or cleared by the [place] FDA and  has been authorized for detection and/or diagnosis of SARS-CoV-2 by FDA under an Emergency Use Authorization (EUA). This EUA will remain   in effect (meaning this test can be used) for the duration of the COVID-19 declaration under Section 564(b)(1) of the Act, 21 U.S.C.section 360bbb-3(b)(1), unless the authorization is terminated  or revoked sooner.       Influenza A by PCR NEGATIVE NEGATIVE Final   Influenza B by PCR NEGATIVE NEGATIVE Final    Comment: (NOTE) The Xpert Xpress SARS-CoV-2/FLU/RSV plus assay is intended as an aid in the diagnosis of influenza from Nasopharyngeal swab specimens and should not be used as a sole basis for treatment. Nasal washings and aspirates are unacceptable for Xpert Xpress SARS-CoV-2/FLU/RSV testing.  Fact Sheet for Patients: EntrepreneurPulse.com.au  Fact Sheet for Healthcare Providers: IncredibleEmployment.be  This test is not yet approved or cleared by the [place] FDA and has been authorized for detection and/or diagnosis of SARS-CoV-2 by FDA under an Emergency Use Authorization (EUA). This EUA will remain in effect (meaning this test can be used) for the duration of the COVID-19 declaration under Section 564(b)(1) of the Act, 21 U.S.C. section 360bbb-3(b)(1), unless the authorization is terminated or revoked.  Performed at [facility], [address] [place], [place] [address]     Labs: CBC: Recent Labs  Lab 01/21/21 [phone] 01/22/21 [phone] 01/22/21 0611 01/23/21 0613 01/24/21 0825  WBC 29.5* 36.3* 36.2* 44.2* 50.8*  NEUTROABS  --   --  7.5 7.2  --   HGB 12.7* 14.4 14.5 14.8 14.9  HCT 38.8* 44.0 44.8 45.8 46.3  MCV 86.0 84.6 85.7 84.3 83.4  PLT 248 267 273 297 546   Basic Metabolic Panel: Recent Labs  Lab 01/21/21 0946 01/22/21 0609 01/23/21 0613 01/24/21 0825  NA 141 140 134* 140  K 4.7 4.2 4.0 4.2  CL 105 102 98 102  CO2 25 23 24 26   GLUCOSE 86 86 113* 115*  BUN 43* 28* 24* 21*  CREATININE 5.72* 2.86* 2.16* 1.88*  CALCIUM 9.3 9.6 9.1 9.0  PHOS  --  5.2* 5.0* 4.0   Liver Function Tests: Recent  Labs  Lab 01/24/21 0825  ALBUMIN 3.8   CBG: Recent Labs  Lab 01/21/21 0953  GLUCAP 77    Discharge time spent: greater than 30 minutes.  Signed: Max Sane, MD [facility] 01/24/2021

## 2021-01-25 LAB — COMP PANEL: LEUKEMIA/LYMPHOMA: Immunophenotypic Profile: 75

## 2021-01-27 ENCOUNTER — Other Ambulatory Visit: Payer: Self-pay

## 2021-01-27 ENCOUNTER — Emergency Department
Admission: EM | Admit: 2021-01-27 | Discharge: 2021-01-27 | Disposition: A | Discharge status: Home / Self Care | Payer: Federal, State, Local not specified - PPO | Attending: Emergency Medicine | Admitting: Emergency Medicine

## 2021-01-27 DIAGNOSIS — D72820 Lymphocytosis (symptomatic): Secondary | ICD-10-CM | POA: Diagnosis not present

## 2021-01-27 DIAGNOSIS — R338 Other retention of urine: Secondary | ICD-10-CM | POA: Insufficient documentation

## 2021-01-27 DIAGNOSIS — R944 Abnormal results of kidney function studies: Secondary | ICD-10-CM | POA: Diagnosis not present

## 2021-01-27 DIAGNOSIS — N401 Enlarged prostate with lower urinary tract symptoms: Secondary | ICD-10-CM | POA: Diagnosis not present

## 2021-01-27 DIAGNOSIS — C61 Malignant neoplasm of prostate: Secondary | ICD-10-CM | POA: Diagnosis not present

## 2021-01-27 DIAGNOSIS — R350 Frequency of micturition: Secondary | ICD-10-CM | POA: Diagnosis present

## 2021-01-27 DIAGNOSIS — D479 Neoplasm of uncertain behavior of lymphoid, hematopoietic and related tissue, unspecified: Secondary | ICD-10-CM | POA: Diagnosis not present

## 2021-01-27 DIAGNOSIS — N320 Bladder-neck obstruction: Secondary | ICD-10-CM

## 2021-01-27 LAB — CBC WITH DIFFERENTIAL/PLATELET
Basophils Absolute: 0.2 10*3/uL — ABNORMAL HIGH (ref 0.0–0.1)
Basophils Relative: 0 %
Eosinophils Absolute: 0.4 10*3/uL (ref 0.0–0.5)
Eosinophils Relative: 1 %
HCT: 40.7 % (ref 39.0–52.0)
Hemoglobin: 13 g/dL (ref 13.0–17.0)
Lymphocytes Relative: 72 %
Lymphs Abs: 47.4 10*3/uL — ABNORMAL HIGH (ref 0.7–4.0)
MCH: 27.7 pg (ref 26.0–34.0)
MCHC: 31.9 g/dL (ref 30.0–36.0)
MCV: 86.6 fL (ref 80.0–100.0)
Monocytes Absolute: 3.3 10*3/uL — ABNORMAL HIGH (ref 0.1–1.0)
Monocytes Relative: 13 %
Myelocytes: 1 %
Neutro Abs: 6.4 10*3/uL (ref 1.7–7.7)
Neutrophils Relative %: 12 %
Other: 1 %
Platelets: 312 10*3/uL (ref 150–400)
RBC: 4.7 MIL/uL (ref 4.22–5.81)
RDW: 13.9 % (ref 11.5–15.5)
Smear Review: NORMAL
WBC: 57.9 10*3/uL (ref 4.0–10.5)
nRBC: 0 % (ref 0.0–0.2)
nRBC: 0 /100 WBC

## 2021-01-27 LAB — CBC
HCT: 40.6 % (ref 39.0–52.0)
Hemoglobin: 13 g/dL (ref 13.0–17.0)
MCH: 27.6 pg (ref 26.0–34.0)
MCHC: 32 g/dL (ref 30.0–36.0)
MCV: 86.2 fL (ref 80.0–100.0)
Platelets: 313 10*3/uL (ref 150–400)
RBC: 4.71 MIL/uL (ref 4.22–5.81)
RDW: 13.7 % (ref 11.5–15.5)
WBC: 57.5 10*3/uL (ref 4.0–10.5)
nRBC: 0 % (ref 0.0–0.2)

## 2021-01-27 LAB — RENAL FUNCTION PANEL
Albumin: 3.8 g/dL (ref 3.5–5.0)
Anion gap: 10 (ref 5–15)
BUN: 22 mg/dL — ABNORMAL HIGH (ref 6–20)
CO2: 25 mmol/L (ref 22–32)
Calcium: 9 mg/dL (ref 8.9–10.3)
Chloride: 105 mmol/L (ref 98–111)
Creatinine, Ser: 1.51 mg/dL — ABNORMAL HIGH (ref 0.61–1.24)
GFR, Estimated: 54 mL/min — ABNORMAL LOW (ref >60)
Glucose, Bld: 95 mg/dL (ref 70–99)
Phosphorus: 2.7 mg/dL (ref 2.5–4.6)
Potassium: 3.9 mmol/L (ref 3.5–5.1)
Sodium: 140 mmol/L (ref 135–145)

## 2021-01-27 NOTE — ED Triage Notes (Signed)
Pt comes with needing foley taken out. Pt states he had it placed last Monday when he was admitted and told to keep in for 7 days.   Pt denies any complaints.

## 2021-01-27 NOTE — Discharge Instructions (Signed)
You were seen today for Foley removal.  We are unable to remove your Foley today.  You should receive a call from a urologist in the area to be seen early next week.  If you do not, please call Dr. [name] at [facility] and tell them that you need to be seen this week.  Your renal function is improving but not yet normal.  Your white blood cell count is 57, slightly increased from discharge but you we will follow-up on this with Dr. Rogue Bussing on February 6.  Drink plenty of fluids.

## 2021-01-27 NOTE — ED Notes (Signed)
Webb Silversmith NP aware of CBC of 58,000.

## 2021-01-27 NOTE — ED Provider Notes (Signed)
[facility] Provider Note    Event Date/Time   First MD Initiated Contact with Patient 01/27/21 1237     (approximate)   History   Foley removal   HPI Ralph [name] is a 56 y.o. male with a history of low-grade prostate cancer, without surgical intervention or radiation, presents to the ER with urinary frequency.  He had been treated with Ciprofloxacin for 1 week prior for presumed UTI without any improvement in symptoms.  In the ER, he noted to be in ARF with a creatinine of 5.72.  CT scan showed severe bilateral hydronephrosis with bladder distention.  Urology and nephrology was consulted.  He had a coud catheter placed by urology and was admitted for observation.  He was discharged 1/19 on Tamsulosin and Finasteride.  His creatinine at discharge was 1.88.  He was advised to follow-up with urology in 1 week for Foley removal.  He presents to the ER today for Foley removal as he cannot get an appointment with urology until the end of February.  He denies any issues or concerns with his Foley catheter at this time.     Physical Exam   Triage Vital Signs: ED Triage Vitals  Enc Vitals Group     BP 01/27/21 1236 123/80     Pulse Rate 01/27/21 1236 91     Resp 01/27/21 1236 18     Temp 01/27/21 1236 98 F (36.7 C)     Temp src --      SpO2 01/27/21 1236 97 %     Weight 01/27/21 1240 260 lb 9.3 oz (118.2 kg)     Height 01/27/21 1240 5\' 10"  (1.778 m)     Head Circumference --      Peak Flow --      Pain Score 01/27/21 1234 0     Pain Loc --      Pain Edu? --      Excl. in [place]? --     Most recent vital signs: Vitals:   01/27/21 1236 01/27/21 1545  BP: 123/80 (!) 152/90  Pulse: 91 83  Resp: 18 16  Temp: 98 F (36.7 C) 97.6 F (36.4 C)  SpO2: 97% 98%     General: Awake, no distress.  CV:  Good peripheral perfusion.  Resp:  Normal effort.  Abd:  No distention.     ED Results / Procedures / Treatments   Labs Labs Reviewed  CBC -  Abnormal; Notable for the following components:      Result Value   WBC 57.5 (*)    All other components within normal limits  RENAL FUNCTION PANEL - Abnormal; Notable for the following components:   BUN 22 (*)    Creatinine, Ser 1.51 (*)    GFR, Estimated 54 (*)    All other components within normal limits  CBC WITH DIFFERENTIAL/PLATELET     MEDICATIONS ORDERED IN ED: Medications - No data to display   IMPRESSION / MDM / [place] / ED COURSE  I reviewed the triage vital signs and the nursing notes.  Bladder Outlet Obstruction secondary to BPH due to Prostate Cancer Under Surveillance Only:  CBC with WBC count of 57.  Patient was discharged on 119 with a WBC count of 50.  He was consulted by oncologist Dr. Rogue Bussing while inpatient, concern for chronic lymphoproliferative disease.  He already has an oncology appointment scheduled as an outpatient for February 6. RFP shows a creatinine of 1.51 that  is trending down, he will follow-up with nephrology as an outpatient on 02/04/2021 Consulted with Dr. Felipa Eth, urologist on-call regarding whether we should remove this patient's Foley given his bladder outlet obstruction and severe hydronephrosis.  Dr. Felipa Eth is in agreements that we should not remove this Foley at this time and have him see urology next week.  Dr. Felipa Eth reports he will call a urologist in the area to have this facilitated as patient could not get in with his urologist until the end of February Encourage adequate water intake Patient and spouse are agreeable to discharge with close urology follow-up     FINAL CLINICAL IMPRESSION(S) / ED DIAGNOSES   Final diagnoses:  Bladder outlet obstruction  Benign prostatic hyperplasia with urinary retention  Prostate cancer ([place])  Lymphocytosis     Rx / DC Orders   ED Discharge Orders     None        Note:  This document was prepared using Dragon voice recognition software and may include  unintentional dictation errors.    Jearld Fenton, NP 01/27/21 1551    Blake Divine, MD 01/27/21 [phone]

## 2021-01-28 LAB — PATHOLOGIST SMEAR REVIEW

## 2021-01-30 NOTE — Progress Notes (Signed)
01/31/2021 [name]:05 PM   Ralph [name] Ralph [name], Ralph [name] [id number]  Referring provider: [address],  [place] [address]  Chief Complaint  Patient presents with   Urinary Retention   Urological history: 1. Prostate cancer -PSA 9.86 01/2021 -iPSA 5.2 prostate biopsies 09/2011 and 12/2011 - 1/12 Gleason 3+3, small volume, left base and left mid -iPSA 8.31 prostate biopsy 2019- rid mid suspicious for carcinoma -managed with AS -was followed by Dr. Fara Chute at [facility]   2. BPH with retention -MR 2018 - prostate volume ~116 cc -presented to ED 01/2021 with inability to void -CT severe bilateral hydronephrosis and hydroureter to the UVJ's with distended bladder  -Foley placed with return 1500 cc yellow clear urine  3.  Nephrolithiasis -Small nonobstructive calculus in the left kidney on CT study dated January 2023  HPI: Ralph [name] is a 56 y.o. male who presents today for a TOV.   He is a patient of Dr. [name] at [facility] for prostate cancer under active surveillance who presented to our emergency department and urinary retention.  He was also found to have bilateral hydronephrosis and was in renal failure before Foley catheter was placed.  His serum creatinine was 1.51 upon discharge.  He was to follow-up with Dr. [name] in [facility], but he was not able to get an appointment until the end of February.  Catheter Removal  Patient is present today for a catheter removal.  9 ml of water was drained from the balloon. A 18 Coude FR foley cath was removed from the bladder no complications were noted . Patient tolerated well.  Performed by: [name], PA-C    PMH: Past Medical History:  Diagnosis Date   Elevated PSA    MS (multiple sclerosis) ([place])    Prostate cancer ([place]) 2013    Surgical History: Past Surgical History:  Procedure Laterality Date   KNEE SURGERY  2002   meniscus    Home Medications:  Allergies as of 01/31/2021    No Known Allergies      Medication List        Accurate as of January 31, 2021 11:59 PM. If you have any questions, ask your nurse or doctor.          finasteride 5 MG tablet Commonly known as: PROSCAR Take 1 tablet (5 mg total) by mouth daily.   rosuvastatin 5 MG tablet Commonly known as: CRESTOR Take 1 tablet (5 mg total) by mouth at bedtime.   tamsulosin 0.4 MG Caps capsule Commonly known as: FLOMAX Take 1 capsule (0.4 mg total) by mouth daily.        Allergies: No Known Allergies  Family History: Family History  Problem Relation Age of Onset   Cancer Sister        breast CA     Social History:  reports that he has never smoked. He has never used smokeless tobacco. He reports that he does not drink alcohol and does not use drugs.  ROS: Pertinent ROS in HPI  Physical Exam: Constitutional:  Well nourished. Alert and oriented, No acute distress. HEENT: [place] AT, moist mucus membranes.  Trachea midline Cardiovascular: No clubbing, cyanosis, or edema. Respiratory: Normal respiratory effort, no increased work of breathing. Neurologic: Grossly intact, no focal deficits, moving all 4 extremities. Psychiatric: Normal mood and affect.  Laboratory Data: Lab Results  Component Value Date   WBC 57.9 (HH) 01/27/2021   HGB 13.0 01/27/2021   HCT 40.7 01/27/2021  MCV 86.6 01/27/2021   PLT 312 01/27/2021    Lab Results  Component Value Date   CREATININE 1.51 (H) 01/27/2021    Lab Results  Component Value Date   HGBA1C 6.3 (H) 01/22/2021       Component Value Date/Time   CHOL 256 (H) 01/22/2021 0609   HDL 29 (L) 01/22/2021 0609   CHOLHDL 8.8 01/22/2021 0609   VLDL 27 01/22/2021 0609   LDLCALC 200 (H) 01/22/2021 0609    Urinalysis    Component Value Date/Time   COLORURINE YELLOW 01/21/2021 0946   APPEARANCEUR CLEAR (A) 01/21/2021 0946   LABSPEC 1.010 01/21/2021 0946   PHURINE 5.5 01/21/2021 0946   GLUCOSEU NEGATIVE 01/21/2021 0946   HGBUR TRACE  (A) 01/21/2021 0946   BILIRUBINUR NEGATIVE 01/21/2021 0946   KETONESUR NEGATIVE 01/21/2021 0946   PROTEINUR NEGATIVE 01/21/2021 0946   NITRITE NEGATIVE 01/21/2021 0946   LEUKOCYTESUR NEGATIVE 01/21/2021 0946  I have reviewed the labs.   Pertinent Imaging:  01/31/21 13:59  Scan Result 862mL     Simple Catheter Placement Due to urinary retention patient is present today for a foley cath placement.  Patient was cleaned and prepped in a sterile fashion with betadine. A 18 FR Coude foley catheter was inserted, urine return was noted  900 ml, urine was yellow clear in color.  The balloon was filled with 10cc of sterile water.  A night bag was attached for drainage.  Patient was given instruction on proper catheter care.  Patient tolerated well, no complications were noted   Performed by: [name], PA-C and Evelina Bucy, CMA    Assessment & Plan:    1. Urinary retention -Patient has failed his trial of void and Foley catheter was replaced -Patient would like to transfer his care to [place] from [facility] as we are closer to him geographically -We will have the patient schedule cystoscopy with Dr. Erlene Quan for further evaluation of his bladder outlet issues and given the size of his prostate he will likely undergo HoLEP procedure  2. Prostate cancer -recent PSA of 9.86 was likely not reflective of the true value as it was obtained after Foley catheter was placed and patient had been in massive urinary retention  Return for cysto with Dr. Erlene Quan for BOO/Retention .  These notes generated with voice recognition software. I apologize for typographical errors.  [name], PA-C  [facility] [address]  [place], [place] [address] [phone]

## 2021-01-31 ENCOUNTER — Ambulatory Visit: Payer: Federal, State, Local not specified - PPO | Admitting: Urology

## 2021-01-31 ENCOUNTER — Other Ambulatory Visit: Payer: Self-pay

## 2021-01-31 DIAGNOSIS — R338 Other retention of urine: Secondary | ICD-10-CM | POA: Diagnosis not present

## 2021-01-31 DIAGNOSIS — C61 Malignant neoplasm of prostate: Secondary | ICD-10-CM | POA: Diagnosis not present

## 2021-01-31 LAB — BLADDER SCAN AMB NON-IMAGING

## 2021-02-02 ENCOUNTER — Encounter: Payer: Self-pay | Admitting: Urology

## 2021-02-11 ENCOUNTER — Inpatient Hospital Stay: Payer: Federal, State, Local not specified - PPO | Attending: Internal Medicine

## 2021-02-11 ENCOUNTER — Encounter: Payer: Self-pay | Admitting: Internal Medicine

## 2021-02-11 ENCOUNTER — Other Ambulatory Visit: Payer: Self-pay

## 2021-02-11 ENCOUNTER — Inpatient Hospital Stay (HOSPITAL_BASED_OUTPATIENT_CLINIC_OR_DEPARTMENT_OTHER): Payer: Federal, State, Local not specified - PPO | Admitting: Internal Medicine

## 2021-02-11 DIAGNOSIS — N320 Bladder-neck obstruction: Secondary | ICD-10-CM | POA: Insufficient documentation

## 2021-02-11 DIAGNOSIS — C9110 Chronic lymphocytic leukemia of B-cell type not having achieved remission: Secondary | ICD-10-CM | POA: Diagnosis present

## 2021-02-11 DIAGNOSIS — Z79899 Other long term (current) drug therapy: Secondary | ICD-10-CM | POA: Diagnosis not present

## 2021-02-11 DIAGNOSIS — G35 Multiple sclerosis: Secondary | ICD-10-CM | POA: Insufficient documentation

## 2021-02-11 DIAGNOSIS — C61 Malignant neoplasm of prostate: Secondary | ICD-10-CM

## 2021-02-11 LAB — CBC WITH DIFFERENTIAL/PLATELET
Abs Immature Granulocytes: 0.14 10*3/uL — ABNORMAL HIGH (ref 0.00–0.07)
Basophils Absolute: 0.2 10*3/uL — ABNORMAL HIGH (ref 0.0–0.1)
Basophils Relative: 0 %
Eosinophils Absolute: 0.2 10*3/uL (ref 0.0–0.5)
Eosinophils Relative: 0 %
HCT: 41.5 % (ref 39.0–52.0)
Hemoglobin: 13.1 g/dL (ref 13.0–17.0)
Immature Granulocytes: 0 %
Lymphocytes Relative: 82 %
Lymphs Abs: 45.8 10*3/uL — ABNORMAL HIGH (ref 0.7–4.0)
MCH: 27.8 pg (ref 26.0–34.0)
MCHC: 31.6 g/dL (ref 30.0–36.0)
MCV: 87.9 fL (ref 80.0–100.0)
Monocytes Absolute: 5.9 10*3/uL — ABNORMAL HIGH (ref 0.1–1.0)
Monocytes Relative: 10 %
Neutro Abs: 4.5 10*3/uL (ref 1.7–7.7)
Neutrophils Relative %: 8 %
Platelets: 245 10*3/uL (ref 150–400)
RBC: 4.72 MIL/uL (ref 4.22–5.81)
RDW: 14.7 % (ref 11.5–15.5)
Smear Review: NORMAL
WBC Morphology: ABNORMAL
WBC: 56.7 10*3/uL (ref 4.0–10.5)
nRBC: 0 % (ref 0.0–0.2)

## 2021-02-11 LAB — COMPREHENSIVE METABOLIC PANEL
ALT: 22 U/L (ref 0–44)
AST: 22 U/L (ref 15–41)
Albumin: 4.3 g/dL (ref 3.5–5.0)
Alkaline Phosphatase: 60 U/L (ref 38–126)
Anion gap: 11 (ref 5–15)
BUN: 7 mg/dL (ref 6–20)
CO2: 25 mmol/L (ref 22–32)
Calcium: 9.5 mg/dL (ref 8.9–10.3)
Chloride: 105 mmol/L (ref 98–111)
Creatinine, Ser: 1.19 mg/dL (ref 0.61–1.24)
GFR, Estimated: >60 mL/min (ref >60)
Glucose, Bld: 88 mg/dL (ref 70–99)
Potassium: 4.1 mmol/L (ref 3.5–5.1)
Sodium: 141 mmol/L (ref 135–145)
Total Bilirubin: 0.5 mg/dL (ref 0.3–1.2)
Total Protein: 7.4 g/dL (ref 6.5–8.1)

## 2021-02-11 LAB — LACTATE DEHYDROGENASE: LDH: 162 U/L (ref 98–192)

## 2021-02-11 NOTE — Progress Notes (Signed)
[place] NOTE  Patient Care Team: [facility] as PCP - General  CHIEF COMPLAINTS/PURPOSE OF CONSULTATION: CLL  Oncology History Overview Note  Chronic lymphocytic leukemia (CLL), positive for CD20, CD22, CD19 and  CD38,  see comment.   ANNOTATION COMMENT IMP Comment VC   Comment: (NOTE)  Expression of CD38 on >30% of the clonal B-cells is reported to be an  unfavorable prognostic factor in CLL.     # CLL stage I- [lymphocytosis/retroperitoneal lymphadenopathy up to 2.5 cm-incidental CT protocol- 2023]  #GEN 2023-bilateral severe hydronephrosis-bladder outlet obstruction/status post catheter [Dr.Brandon]  #History of prostate cancer under surveillance [[facility]]   CLL (chronic lymphocytic leukemia) ([place])  02/11/2021 Initial Diagnosis   CLL (chronic lymphocytic leukemia) ([facility])      HISTORY OF PRESENTING ILLNESS: Alone.  Ambulating with a rolling walker; Foley cath in place. Ralph [name] 56 y.o.  male history of multiple sclerosis-not on active therapy; chronic mild lymphocytosis was recently admitted to hospital for urinary retention.  On further work-up patient noted to have bladder outlet obstruction with bilateral hydronephrosis s/p Foley catheter.  Patient was reevaluated outpatient-failed trial of voiding.  Patient is currently awaiting evaluation with urology for cystoscopy.   Review of Systems  Constitutional:  Negative for chills, diaphoresis, fever, malaise/fatigue and weight loss.  HENT:  Negative for nosebleeds and sore throat.   Eyes:  Negative for double vision.  Respiratory:  Negative for cough, hemoptysis, sputum production, shortness of breath and wheezing.   Cardiovascular:  Negative for chest pain, palpitations, orthopnea and leg swelling.  Gastrointestinal:  Negative for abdominal pain, blood in stool, constipation, diarrhea, heartburn, melena, nausea and vomiting.  Genitourinary:  Negative for dysuria, frequency and  urgency.  Musculoskeletal:  Negative for back pain and joint pain.  Skin: Negative.  Negative for itching and rash.  Neurological:  Negative for dizziness, tingling, focal weakness, weakness and headaches.  Endo/Heme/Allergies:  Does not bruise/bleed easily.  Psychiatric/Behavioral:  Negative for depression. The patient is not nervous/anxious and does not have insomnia.     MEDICAL HISTORY:  Past Medical History:  Diagnosis Date   Elevated PSA    MS (multiple sclerosis) ([place])    Prostate cancer ([place]) 2013    SURGICAL HISTORY: Past Surgical History:  Procedure Laterality Date   KNEE SURGERY  2002   meniscus    SOCIAL HISTORY: Social History   Socioeconomic History   Marital status: Single    Spouse name: Not on file   Number of children: 0   Years of education: College   Highest education level: Not on file  Occupational History   Occupation: Actor Visual merchandiser)  Tobacco Use   Smoking status: Never   Smokeless tobacco: Never  Vaping Use   Vaping Use: Never used  Substance and Sexual Activity   Alcohol use: No    Comment: Former alcohol consumption   Drug use: No   Sexual activity: Not Currently  Other Topics Concern   Not on file  Social History Narrative   Lives alone   Caffeine use: Diet coke/pepsi/Mt Dew   Right handed       Denies history of smoking.  Denies any alcohol; lives in [place].    Social Determinants of Health   Financial Resource Strain: Not on file  Food Insecurity: Not on file  Transportation Needs: Not on file  Physical Activity: Not on file  Stress: Not on file  Social Connections: Not on file  Intimate Partner Violence: Not  on file    FAMILY HISTORY: Family History  Problem Relation Age of Onset   Cancer Sister        breast CA     ALLERGIES:  has No Known Allergies.  MEDICATIONS:  Current Outpatient Medications  Medication Sig Dispense Refill   finasteride (PROSCAR) 5 MG tablet Take 1 tablet (5 mg total) by mouth  daily. 30 tablet 0   rosuvastatin (CRESTOR) 5 MG tablet Take 1 tablet (5 mg total) by mouth at bedtime. 30 tablet 0   tamsulosin (FLOMAX) 0.4 MG CAPS capsule Take 1 capsule (0.4 mg total) by mouth daily. 30 capsule 0   No current facility-administered medications for this visit.      [name]  PHYSICAL EXAMINATION:  Vitals:   02/11/21 1510  BP: 100/78  Pulse: 97  Temp: 99.3 F (37.4 C)  SpO2: 99%   Filed Weights   02/11/21 1510  Weight: 260 lb (117.9 kg)   Foley catheter in place. Physical Exam Vitals and nursing note reviewed.  HENT:     Head: Normocephalic and atraumatic.     Mouth/Throat:     Pharynx: Oropharynx is clear.  Eyes:     Extraocular Movements: Extraocular movements intact.     Pupils: Pupils are equal, round, and reactive to light.  Cardiovascular:     Rate and Rhythm: Normal rate and regular rhythm.  Pulmonary:     Comments: Decreased breath sounds bilaterally.  Abdominal:     Palpations: Abdomen is soft.  Musculoskeletal:        General: Normal range of motion.     Cervical back: Normal range of motion.  Skin:    General: Skin is warm.  Neurological:     General: No focal deficit present.     Mental Status: He is alert and oriented to person, place, and time.  Psychiatric:        Behavior: Behavior normal.        Judgment: Judgment normal.    LABORATORY DATA:  I have reviewed the data as listed Lab Results  Component Value Date   WBC 56.7 (HH) 02/11/2021   HGB 13.1 02/11/2021   HCT 41.5 02/11/2021   MCV 87.9 02/11/2021   PLT 245 02/11/2021   Recent Labs    01/24/21 0825 01/27/21 1433 02/11/21 1457  NA 140 140 141  K 4.2 3.9 4.1  CL 102 105 105  CO2 26 25 25   GLUCOSE 115* 95 88  BUN 21* 22* 7  CREATININE 1.88* 1.51* 1.19  CALCIUM 9.0 9.0 9.5  GFRNONAA 41* 54* >60  PROT  --   --  7.4  ALBUMIN 3.8 3.8 4.3  AST  --   --  22  ALT  --   --  22  ALKPHOS  --   --  60  BILITOT  --   --  0.5    RADIOGRAPHIC STUDIES: I have  personally reviewed the radiological images as listed and agreed with the findings in the report. CT Renal Stone Study  Result Date: 01/21/2021 CLINICAL DATA:  Bladder neck obstruction, acute renal failure, urinary frequency EXAM: CT ABDOMEN AND PELVIS WITHOUT CONTRAST TECHNIQUE: Multidetector CT imaging of the abdomen and pelvis was performed following the standard protocol without IV contrast. RADIATION DOSE REDUCTION: This exam was performed according to the departmental dose-optimization program which includes automated exposure control, adjustment of the mA and/or kV according to patient size and/or use of iterative reconstruction technique. COMPARISON:  None. FINDINGS: Lower chest: No  acute abnormality. Hepatobiliary: No solid liver abnormality is seen. No gallstones, gallbladder wall thickening, or biliary dilatation. Pancreas: Unremarkable. No pancreatic ductal dilatation or surrounding inflammatory changes. Spleen: Splenomegaly, maximum span 16.4 cm. Adrenals/Urinary Tract: Adrenal glands are unremarkable. Severe bilateral hydronephrosis and hydroureter to the ureterovesicular junctions. Small nonobstructive calculus of the posterior midportion of the left kidney (series 2, image 34). Distended urinary bladder extending well into the abdomen, measuring at least 20 cm. Stomach/Bowel: Stomach is within normal limits. Appendix appears normal. No evidence of bowel wall thickening, distention, or inflammatory changes. Descending and sigmoid diverticulosis. Vascular/Lymphatic: No significant vascular findings are present. Numerous prominent retroperitoneal and small bowel mesenteric lymph nodes, largest mesenteric nodes measuring up to 2.2 x 1.2 cm (series 2, image 35). Reproductive: Severe prostatomegaly with median lobe hypertrophy. Other: No abdominal wall hernia or abnormality. No ascites. Musculoskeletal: No acute or significant osseous findings. IMPRESSION: 1. Severe bilateral hydronephrosis and  hydroureter to the ureterovesicular junctions. Distended urinary bladder extending well into the abdomen, measuring at least 20 cm. Findings are consistent with bladder outlet obstruction in the setting of severe prostatomegaly. 2. Small nonobstructive calculus of the posterior midportion of the left kidney. 3. Numerous prominent retroperitoneal and small bowel mesenteric lymph nodes, largest mesenteric nodes measuring up to 2.2 x 1.2 cm. These are nonspecific and may be reactive, although lymphoproliferative disorder is not excluded. Consider CT follow-up in 3 months. 4. Splenomegaly. Electronically Signed   By: Delanna Ahmadi M.D.   On: 01/21/2021 11:04    CLL (chronic lymphocytic leukemia) (HCC) #CLL-CD38 positive stage I [retroperitoneal lymph nodes]-White count 55,000; normal hemoglobin and platelets.  Patient asymptomatic on clinical basis.  Patient continues to be asymptomatic clinically. Treatment will be recommended only if patient is symptomatic. Symptoms triggering treatment would be-profuse night sweats; weight loss; drop in his hemoglobin/platelets; frequent infections; significant lymphadenopathy etc.   # I had a long discussion the patient regarding the multiple treatment options available for CLL at this time including- pills Ibrutinib/ infusion etc. however as patient is clinically asymptomatic would recommend continue monitoring every 3 months also.  We will check Ig VH mutational status FISH panel next visit.  #Bladder outlet obstruction [Dr.Brandon]-status post Foley catheter; awaiting cystoscopy/possibly HoLEP  #History of prostate cancer-under active surveillance [[facility]]-follow-up with urology locally.  # MS clinically STABLE-   # DISPOSITION: # follow up in 3 months- MD; labs- cbc/cmp;LDH-Dr.B    All questions were answered. The patient knows to call the clinic with any problems, questions or concerns.       Cammie Sickle, MD 02/11/2021 4:05 PM

## 2021-02-11 NOTE — Progress Notes (Signed)
° °  02/12/2021 CC:  Chief Complaint  Patient presents with   Cysto     HPI: Ralph [name] is a 56 y.o. male with a personal history of prostate cancer, BPH with retention, and nephrolithiasis, who presents today for a cystoscopy.   Surgical pathology of Gleason 3+3, small colume left base an left mid. He underwent Biopsy in 2019 that showed rid mid suspicious for carcinoma. He is manages with AS and is followed by Dr. Fara Chute at [facility].   He was seen in the ED in 01/2021 with inability to void. CT visualized sever bilateral hydronephrosis and hydroureter to the UVJs with distended bladder. His catheter was removed on 01/31/2021.   He failed voiding trial on 01/31/2021 with Ralph Council, PA-C.   His most recent PSA on 01/22/2021 was 9.56   Recently started on finasteride and Flomax, previously on no medications.  Estimated prostate volume in 2018 116 cc.  Vitals:   02/12/21 1113  BP: 138/76  Pulse: 80  NED. A&Ox3.   No respiratory distress   Abd soft, NT, ND Normal phallus with bilateral descended testicles  Cystoscopy Procedure Note  Patient identification was confirmed, informed consent was obtained, and patient was prepped using Betadine solution.  Lidocaine jelly was administered per urethral meatus.     Pre-Procedure: - Inspection reveals a normal caliber ureteral meatus.  Procedure: The flexible cystoscope was introduced without difficulty - No urethral strictures/lesions are present. - Massive prostate with friable prostatic mucosa, trilobar coaptation - Bilateral ureteral orifices identified - No bladder stones - moderate trabeculation - Catheter cystitis primarily in posterior bladder wall and dome  Retroflexion shows a very large well-circumscribed median lobe   Post-Procedure: - Patient tolerated the procedure well   Assessment/ Plan:  Urinary retention - He failed his voiding trial today - We will replace his catheter today - We discussed a HoLEP  procedure today versus simple prostatectomy based on the size of his prostate - We discussed the common postoperative course following holep including need for overnight Foley catheter, temporary worsening of irritative voiding symptoms, and occasional stress incontinence which typically lasts up to 6 months but can persist.  We discussed retrograde ejaculation and damage to surrounding structures including the urinary sphincter. Other uncommon complications including hematuria and urinary tract infection. He understands all of the above and is willing to proceed as planned. -He understands that this is not a prostate cancer surgery, meant to address his BPH and urinary obstruction.  If he does have an upstaging of his prostate cancer down the road, could consider radiation.  HoLEP may preclude future prostatectomy.  He understands this.  2. Prostate cancer - Recent PSA of 9.86  - PSA; future   Return to OR for HoLEP  I,Kailey Littlejohn,acting as a scribe for Hollice Espy, MD.,have documented all relevant documentation on the behalf of Hollice Espy, MD,as directed by  Hollice Espy, MD while in the presence of Hollice Espy, MD.  Hollice Espy, MD

## 2021-02-11 NOTE — Assessment & Plan Note (Addendum)
#  CLL-CD38 positive stage I [retroperitoneal lymph nodes]-White count 55,000; normal hemoglobin and platelets.  Patient asymptomatic on clinical basis.  Patient continues to be asymptomatic clinically. Treatment will be recommended only if patient is symptomatic. Symptoms triggering treatment would be-profuse night sweats; weight loss; drop in his hemoglobin/platelets; frequent infections; significant lymphadenopathy etc.   # I had a long discussion the patient regarding the multiple treatment options available for CLL at this time including- pills Ibrutinib/ infusion etc. however as patient is clinically asymptomatic would recommend continue monitoring every 3 months also.  We will check Ig VH mutational status FISH panel next visit.  #Bladder outlet obstruction [Dr.Brandon]-status post Foley catheter; awaiting cystoscopy/possibly HoLEP  #History of prostate cancer-under active surveillance [[facility]]-follow-up with urology locally.  # MS clinically STABLE-   # DISPOSITION: # follow up in 3 months- MD; labs- cbc/cmp;LDH-Dr.B

## 2021-02-11 NOTE — H&P (View-Only) (Signed)
° °  02/12/2021 CC:  Chief Complaint  Patient presents with   Cysto     HPI: Ralph [name] is a 56 y.o. male with a personal history of prostate cancer, BPH with retention, and nephrolithiasis, who presents today for a cystoscopy.   Surgical pathology of Gleason 3+3, small colume left base an left mid. He underwent Biopsy in 2019 that showed rid mid suspicious for carcinoma. He is manages with AS and is followed by Dr. Fara Chute at [facility].   He was seen in the ED in 01/2021 with inability to void. CT visualized sever bilateral hydronephrosis and hydroureter to the UVJs with distended bladder. His catheter was removed on 01/31/2021.   He failed voiding trial on 01/31/2021 with [name], PA-C.   His most recent PSA on 01/22/2021 was 9.56   Recently started on finasteride and Flomax, previously on no medications.  Estimated prostate volume in 2018 116 cc.  Vitals:   02/12/21 1113  BP: 138/76  Pulse: 80  NED. A&Ox3.   No respiratory distress   Abd soft, NT, ND Normal phallus with bilateral descended testicles  Cystoscopy Procedure Note  Patient identification was confirmed, informed consent was obtained, and patient was prepped using Betadine solution.  Lidocaine jelly was administered per urethral meatus.     Pre-Procedure: - Inspection reveals a normal caliber ureteral meatus.  Procedure: The flexible cystoscope was introduced without difficulty - No urethral strictures/lesions are present. - Massive prostate with friable prostatic mucosa, trilobar coaptation - Bilateral ureteral orifices identified - No bladder stones - moderate trabeculation - Catheter cystitis primarily in posterior bladder wall and dome  Retroflexion shows a very large well-circumscribed median lobe   Post-Procedure: - Patient tolerated the procedure well   Assessment/ Plan:  Urinary retention - He failed his voiding trial today - We will replace his catheter today - We discussed a HoLEP  procedure today versus simple prostatectomy based on the size of his prostate - We discussed the common postoperative course following holep including need for overnight Foley catheter, temporary worsening of irritative voiding symptoms, and occasional stress incontinence which typically lasts up to 6 months but can persist.  We discussed retrograde ejaculation and damage to surrounding structures including the urinary sphincter. Other uncommon complications including hematuria and urinary tract infection. He understands all of the above and is willing to proceed as planned. -He understands that this is not a prostate cancer surgery, meant to address his BPH and urinary obstruction.  If he does have an upstaging of his prostate cancer down the road, could consider radiation.  HoLEP may preclude future prostatectomy.  He understands this.  2. Prostate cancer - Recent PSA of 9.86  - PSA; future   Return to OR for HoLEP  I,Kailey Littlejohn,acting as a scribe for Hollice Espy, MD.,have documented all relevant documentation on the behalf of Hollice Espy, MD,as directed by  Hollice Espy, MD while in the presence of Hollice Espy, MD.  Hollice Espy, MD

## 2021-02-12 ENCOUNTER — Encounter: Payer: Self-pay | Admitting: Urology

## 2021-02-12 ENCOUNTER — Other Ambulatory Visit: Payer: Self-pay

## 2021-02-12 ENCOUNTER — Ambulatory Visit: Payer: Federal, State, Local not specified - PPO | Admitting: Urology

## 2021-02-12 ENCOUNTER — Telehealth: Payer: Self-pay | Admitting: Urology

## 2021-02-12 VITALS — BP 138/76 | HR 80 | Ht 70.0 in | Wt 260.0 lb

## 2021-02-12 DIAGNOSIS — R338 Other retention of urine: Secondary | ICD-10-CM

## 2021-02-12 DIAGNOSIS — C61 Malignant neoplasm of prostate: Secondary | ICD-10-CM

## 2021-02-12 NOTE — Progress Notes (Signed)
Catheter Removal  Patient is present today for a catheter removal.  26ml of water was drained from the balloon. A 18FR coude foley cath was removed from the bladder no complications were noted . Patient tolerated well.  Performed by: Lesli Albee  Follow up/ Additional notes: as directed.

## 2021-02-12 NOTE — Telephone Encounter (Signed)
Pt would like someone to give him a call back in regards to setting up surgery. He saw Dr. Erlene Quan today for a cysto. Okay to leave message on vm if he does not answer.

## 2021-02-12 NOTE — Patient Instructions (Signed)

## 2021-02-12 NOTE — Progress Notes (Signed)
Simple Catheter Placement  Due to urinary retention patient is present today for a foley cath placement.  Patient was cleaned and prepped in a sterile fashion with betadine. A 18 FR coude foley catheter was inserted, urine return was noted  546ml, urine was pink in color.  The balloon was filled with 10cc of sterile water.  A night bag was attached for drainage. Patient was also given a night bag to take home and was given instruction on how to change from one bag to another.  Patient was given instruction on proper catheter care.  Patient tolerated well, no complications were noted   Performed by: Verlene Mayer, CMA

## 2021-02-13 NOTE — Telephone Encounter (Signed)
Surgical Physician Order Form [facility] [place]  * Scheduling expectation : Next Available  *Length of Case:   *Clearance needed: no  *Anticoagulation Instructions: Hold all anticoagulants  *Aspirin Instructions: Hold Aspirin  *Post-op visit Date/Instructions:  1-3 day cath removal  *Diagnosis: BPH w/urinary obstruction  *Procedure:     HOLEP (10289)   Additional orders: N/A  -Admit type: OUTpatient  -Anesthesia: General  -VTE Prophylaxis Standing Order SCDs       Other:   -Standing Lab Orders Per Anesthesia    Lab other: UA&Urine Culture (from foley)  -Standing Test orders EKG/Chest x-ray per Anesthesia       Test other:   - Medications:  Ancef 2gm IV  -Other orders:  N/A

## 2021-02-18 ENCOUNTER — Other Ambulatory Visit: Payer: Self-pay

## 2021-02-18 DIAGNOSIS — N401 Enlarged prostate with lower urinary tract symptoms: Secondary | ICD-10-CM

## 2021-02-18 DIAGNOSIS — N138 Other obstructive and reflux uropathy: Secondary | ICD-10-CM

## 2021-02-18 NOTE — Telephone Encounter (Signed)
I spoke with Ralph [name]. We have discussed possible surgery dates and Monday March 6th, 2023 was agreed upon by all parties. Patient given information about surgery date, what to expect pre-operatively and post operatively.   We discussed that a Pre-Admission Testing office will be calling to set up the pre-op visit that will take place prior to surgery, and that these appointments are typically done over the phone with a Pre-Admissions RN.   Informed patient that our office will communicate any additional care to be provided after surgery. Patients questions or concerns were discussed during our call. Advised to call our office should there be any additional information, questions or concerns that arise. Patient verbalized understanding.

## 2021-02-18 NOTE — Progress Notes (Signed)
[facility] Posting Form   Surgery Date/Time: Date: 03/11/2021  Surgeon: Dr. Hollice Espy, MD  Surgery Location: Day Surgery  Inpt ( No  )   Outpt (Yes)   Obs ( No  )   Diagnosis: BPH with Urinary Obstruction N40.1, N13.8  -CPT: 52649  Surgery: Holmium Laser Enucleation of The Prostate  Stop Anticoagulations: Yes also hold ASA  Cardiac/Medical/Pulmonary Clearance needed: no  *Orders entered into EPIC  Date: 02/18/21   *Case booked in EPIC  Date: 02/15/2021  *Notified pt of Surgery: Date: 02/15/2021  PRE-OP UA & CX: Yes, will obtain on 02/26/2021  *Placed into Prior Authorization Work Fabio Bering Date: 02/18/21   Assistant/laser/rep:No

## 2021-02-26 ENCOUNTER — Ambulatory Visit: Payer: Federal, State, Local not specified - PPO | Admitting: *Deleted

## 2021-02-26 ENCOUNTER — Other Ambulatory Visit: Payer: Self-pay

## 2021-02-26 DIAGNOSIS — R338 Other retention of urine: Secondary | ICD-10-CM

## 2021-02-26 DIAGNOSIS — N138 Other obstructive and reflux uropathy: Secondary | ICD-10-CM

## 2021-02-26 DIAGNOSIS — N401 Enlarged prostate with lower urinary tract symptoms: Secondary | ICD-10-CM

## 2021-02-26 LAB — URINALYSIS, COMPLETE
Bilirubin, UA: NEGATIVE
Glucose, UA: NEGATIVE
Ketones, UA: NEGATIVE
Nitrite, UA: NEGATIVE
Specific Gravity, UA: 1.015 (ref 1.005–1.030)
Urobilinogen, Ur: 0.2 mg/dL (ref 0.2–1.0)
pH, UA: 7 (ref 5.0–7.5)

## 2021-02-26 LAB — MICROSCOPIC EXAMINATION

## 2021-02-26 NOTE — Progress Notes (Signed)
Plug the cath and than got a sample of urine.

## 2021-03-01 LAB — CULTURE, URINE COMPREHENSIVE

## 2021-03-05 ENCOUNTER — Telehealth: Payer: Self-pay | Admitting: Urgent Care

## 2021-03-05 ENCOUNTER — Encounter
Admission: RE | Admit: 2021-03-05 | Discharge: 2021-03-05 | Disposition: A | Discharge status: Home / Self Care | Payer: Federal, State, Local not specified - PPO | Source: Ambulatory Visit | Attending: Urology | Admitting: Urology

## 2021-03-05 ENCOUNTER — Other Ambulatory Visit: Payer: Self-pay

## 2021-03-05 HISTORY — DX: Prediabetes: R73.03

## 2021-03-05 HISTORY — DX: Chronic lymphocytic leukemia of B-cell type not having achieved remission: C91.10

## 2021-03-05 NOTE — Progress Notes (Signed)
°  Perioperative Services Pre-Admission/Anesthesia Testing    Date: 03/08/21  Name: Ralph [name] MRN:   [id number]  Re: Presurgical clearance prior to HoLEP procedure  Clinical Notes:  Patient is scheduled to undergo a HoLEP procedure on 03/11/2021 with Dr. Hollice Espy, MD.  In review of his EMR, patient noted to have a past medical history significant for CLL, prostate cancer, and multiple sclerosis.  Last CBC was performed on 02/11/2021, at which time his WBC count was elevated at 56.7 K/uL.  Hemoglobin, hematocrit, and platelets all normal.  Confirmed with patient that he is not currently taking any type of steroids or immunosuppressive therapies.  Patient is currently being followed by the [facility] by Dr. Charlaine Dalton, MD. He was last seen in the oncology clinic on 02/11/2021; notes reviewed.  Patient's with stage I, clinically stable, CLL. Primary oncologist did discuss available treatment options including both oral and IV chemotherapy regimens, however provided the patient clinically asymptomatic, decision was made to continue every 3 month surveillance routine. There was also bladder outlet obstruction and the setting of a patient with a history of prostate cancer that is under active surveillance. Oncology aware that patient seeing urology for possible cystoscopy and HoLEP. Patient to follow-up with outpatient oncology in 3 months or sooner if needed.   Given patient's oncology history, presurgical clearance was sought by the PAT team. Per oncology Rogue Bussing, MD), "no specific concerns from hematology/oncology standpoint prior to surgery. May proceed at an overall LOW risk".  Honor Loh, MSN, APRN, FNP-C, CEN [facility]  Peri-operative Services Nurse Practitioner Phone: [phone] 03/08/21 12:11 PM  NOTE: This note has been prepared using Dragon dictation software. Despite my best ability to proofread, there is  always the potential that unintentional transcriptional errors may still occur from this process.

## 2021-03-05 NOTE — Patient Instructions (Signed)
Your procedure is scheduled on: 03/11/21 Report to [place]. To find out your arrival time please call [phone] between 1PM - 3PM on 03/08/21.  Remember: Instructions that are not followed completely may result in serious medical risk, up to and including death, or upon the discretion of your surgeon and anesthesiologist your surgery may need to be rescheduled.     _X__ 1. Do not eat food or drink any liquids after midnight the night before your procedure.                 No gum chewing or hard candies.   __X__2.  On the morning of surgery brush your teeth with toothpaste and water, you                 may rinse your mouth with mouthwash if you wish.  Do not swallow any              toothpaste of mouthwash.     _X__ 3.  No Alcohol for 24 hours before or after surgery.   _X__ 4.  Do Not Smoke or use e-cigarettes For 24 Hours Prior to Your Surgery.                 Do not use any chewable tobacco products for at least 6 hours prior to                 surgery.  ____  5.  Bring all medications with you on the day of surgery if instructed.   __X__  6.  Notify your doctor if there is any change in your medical condition      (cold, fever, infections).     Do not wear jewelry, make-up, hairpins, clips or nail polish. Do not wear lotions, powders, or perfumes. You may wear deodorant. Do not shave body hair 48 hours prior to surgery. Men may shave face and neck. Do not bring valuables to the hospital.    [facility] is not responsible for any belongings or valuables.  Contacts, dentures/partials or body piercings may not be worn into surgery. Bring a case for your contacts, glasses or hearing aids, a denture cup will be supplied. Leave your suitcase in the car. After surgery it may be brought to your room. For patients admitted to the hospital, discharge time is determined by your treatment team.   Patients discharged the day of  surgery will not be allowed to drive home.   Please read over the following fact sheets that you were given:     __X__ Take these medicines the morning of surgery with A SIP OF WATER:    1. finasteride (PROSCAR) 5 MG tablet  2. tamsulosin (FLOMAX) 0.4 MG CAPS capsule  3.   4.  5.  6.  ____ Fleet Enema (as directed)   ____ Use CHG Soap/SAGE wipes as directed  ____ Use inhalers on the day of surgery  ____ Stop metformin/Janumet/Farxiga 2 days prior to surgery    ____ Take 1/2 of usual insulin dose the night before surgery. No insulin the morning          of surgery.   ____ Stop Blood Thinners Coumadin/Plavix/Xarelto/Pleta/Pradaxa/Eliquis/Effient/Aspirin  on   Or contact your Surgeon, Cardiologist or Medical Doctor regarding  ability to stop your blood thinners  __X__ Stop Anti-inflammatories 7 days before surgery such as Advil, Ibuprofen, Motrin,  BC or Goodies Powder, Naprosyn, Naproxen, Aleve, Aspirin  May use Tylenol if needed  __X__ Stop all herbals and supplements, fish oil or vitamins  until after surgery.    ____ Bring C-Pap to the hospital.    May use Dial soap for shower the morning of procedure.

## 2021-03-11 ENCOUNTER — Ambulatory Visit
Admission: RE | Admit: 2021-03-11 | Discharge: 2021-03-11 | Disposition: A | Discharge status: Home / Self Care | Payer: Federal, State, Local not specified - PPO | Attending: Urology | Admitting: Urology

## 2021-03-11 ENCOUNTER — Ambulatory Visit: Payer: Federal, State, Local not specified - PPO | Admitting: Urgent Care

## 2021-03-11 ENCOUNTER — Encounter
Admission: RE | Disposition: A | Discharge status: Home / Self Care | Payer: Self-pay | Source: Home / Self Care | Attending: Urology

## 2021-03-11 ENCOUNTER — Encounter: Payer: Self-pay | Admitting: Urology

## 2021-03-11 ENCOUNTER — Other Ambulatory Visit: Payer: Self-pay

## 2021-03-11 DIAGNOSIS — R7303 Prediabetes: Secondary | ICD-10-CM | POA: Diagnosis not present

## 2021-03-11 DIAGNOSIS — N320 Bladder-neck obstruction: Secondary | ICD-10-CM | POA: Insufficient documentation

## 2021-03-11 DIAGNOSIS — R338 Other retention of urine: Secondary | ICD-10-CM | POA: Diagnosis not present

## 2021-03-11 DIAGNOSIS — C61 Malignant neoplasm of prostate: Secondary | ICD-10-CM | POA: Insufficient documentation

## 2021-03-11 DIAGNOSIS — N401 Enlarged prostate with lower urinary tract symptoms: Secondary | ICD-10-CM | POA: Insufficient documentation

## 2021-03-11 DIAGNOSIS — G35 Multiple sclerosis: Secondary | ICD-10-CM | POA: Diagnosis not present

## 2021-03-11 DIAGNOSIS — N138 Other obstructive and reflux uropathy: Secondary | ICD-10-CM | POA: Diagnosis not present

## 2021-03-11 DIAGNOSIS — C9110 Chronic lymphocytic leukemia of B-cell type not having achieved remission: Secondary | ICD-10-CM | POA: Insufficient documentation

## 2021-03-11 HISTORY — PX: HOLEP-LASER ENUCLEATION OF THE PROSTATE WITH MORCELLATION: SHX6641

## 2021-03-11 SURGERY — ENUCLEATION, PROSTATE, USING LASER, WITH MORCELLATION
Anesthesia: General

## 2021-03-11 MED ORDER — CEFAZOLIN SODIUM-DEXTROSE 2-4 GM/100ML-% IV SOLN
2.0000 g | INTRAVENOUS | Status: AC
  Administered 2021-03-11: 2 g via INTRAVENOUS

## 2021-03-11 MED ORDER — PROPOFOL 10 MG/ML IV BOLUS
INTRAVENOUS | Status: AC
  Filled 2021-03-11: qty 20

## 2021-03-11 MED ORDER — ACETAMINOPHEN 10 MG/ML IV SOLN
INTRAVENOUS | Status: DC | PRN
Start: 2021-03-11 — End: 2021-03-11
  Administered 2021-03-11: 1000 mg via INTRAVENOUS

## 2021-03-11 MED ORDER — LACTATED RINGERS IV SOLN: INTRAVENOUS | Status: DC

## 2021-03-11 MED ORDER — FENTANYL CITRATE (PF) 100 MCG/2ML IJ SOLN: 25.0000 ug | INTRAMUSCULAR | Status: DC | PRN

## 2021-03-11 MED ORDER — CHLORHEXIDINE GLUCONATE 0.12 % MT SOLN: 15.0000 mL | Freq: Once | OROMUCOSAL | Status: AC

## 2021-03-11 MED ORDER — ONDANSETRON HCL 4 MG/2ML IJ SOLN
INTRAMUSCULAR | Status: DC | PRN
Start: 2021-03-11 — End: 2021-03-11
  Administered 2021-03-11: 4 mg via INTRAVENOUS

## 2021-03-11 MED ORDER — PROPOFOL 10 MG/ML IV BOLUS
INTRAVENOUS | Status: DC | PRN
Start: 2021-03-11 — End: 2021-03-11
  Administered 2021-03-11: 150 mg via INTRAVENOUS

## 2021-03-11 MED ORDER — FENTANYL CITRATE (PF) 100 MCG/2ML IJ SOLN
INTRAMUSCULAR | Status: AC
  Filled 2021-03-11: qty 2

## 2021-03-11 MED ORDER — CHLORHEXIDINE GLUCONATE 0.12 % MT SOLN
OROMUCOSAL | Status: AC
  Administered 2021-03-11: 15 mL via OROMUCOSAL
  Filled 2021-03-11: qty 15

## 2021-03-11 MED ORDER — FUROSEMIDE 10 MG/ML IJ SOLN
INTRAMUSCULAR | Status: DC | PRN
  Administered 2021-03-11: 10 mg via INTRAMUSCULAR

## 2021-03-11 MED ORDER — ROCURONIUM BROMIDE 100 MG/10ML IV SOLN
INTRAVENOUS | Status: DC | PRN
  Administered 2021-03-11: 50 mg via INTRAVENOUS
  Administered 2021-03-11: 20 mg via INTRAVENOUS
  Administered 2021-03-11 (×3): 10 mg via INTRAVENOUS

## 2021-03-11 MED ORDER — DEXAMETHASONE SODIUM PHOSPHATE 10 MG/ML IJ SOLN
INTRAMUSCULAR | Status: DC | PRN
  Administered 2021-03-11: 10 mg via INTRAVENOUS

## 2021-03-11 MED ORDER — FAMOTIDINE 20 MG PO TABS
ORAL_TABLET | ORAL | Status: AC
  Administered 2021-03-11: 20 mg via ORAL
  Filled 2021-03-11: qty 1

## 2021-03-11 MED ORDER — MIDAZOLAM HCL 2 MG/2ML IJ SOLN
INTRAMUSCULAR | Status: DC | PRN
  Administered 2021-03-11: 2 mg via INTRAVENOUS

## 2021-03-11 MED ORDER — ORAL CARE MOUTH RINSE: 15.0000 mL | Freq: Once | OROMUCOSAL | Status: AC

## 2021-03-11 MED ORDER — DEXMEDETOMIDINE (PRECEDEX) IN NS 20 MCG/5ML (4 MCG/ML) IV SYRINGE
PREFILLED_SYRINGE | INTRAVENOUS | Status: DC | PRN
  Administered 2021-03-11: 8 ug via INTRAVENOUS
  Administered 2021-03-11: 12 ug via INTRAVENOUS
  Administered 2021-03-11: 8 ug via INTRAVENOUS

## 2021-03-11 MED ORDER — FENTANYL CITRATE (PF) 100 MCG/2ML IJ SOLN
INTRAMUSCULAR | Status: DC | PRN
Start: 2021-03-11 — End: 2021-03-11
  Administered 2021-03-11 (×3): 50 ug via INTRAVENOUS

## 2021-03-11 MED ORDER — ONDANSETRON HCL 4 MG/2ML IJ SOLN: 4.0000 mg | Freq: Once | INTRAMUSCULAR | Status: DC | PRN

## 2021-03-11 MED ORDER — LIDOCAINE HCL (CARDIAC) PF 100 MG/5ML IV SOSY
PREFILLED_SYRINGE | INTRAVENOUS | Status: DC | PRN
Start: 2021-03-11 — End: 2021-03-11
  Administered 2021-03-11 (×2): 50 mg via INTRAVENOUS

## 2021-03-11 MED ORDER — MIDAZOLAM HCL 2 MG/2ML IJ SOLN
INTRAMUSCULAR | Status: AC
  Filled 2021-03-11: qty 2

## 2021-03-11 MED ORDER — SODIUM CHLORIDE 0.9 % IR SOLN
Status: DC | PRN
  Administered 2021-03-11: 45000 mL
  Administered 2021-03-11 (×2): 3000 mL

## 2021-03-11 MED ORDER — FAMOTIDINE 20 MG PO TABS: 20.0000 mg | ORAL_TABLET | Freq: Once | ORAL | Status: AC

## 2021-03-11 MED ORDER — CEFAZOLIN SODIUM-DEXTROSE 2-4 GM/100ML-% IV SOLN
INTRAVENOUS | Status: AC
  Filled 2021-03-11: qty 100

## 2021-03-11 MED ORDER — HYDROCODONE-ACETAMINOPHEN 5-325 MG PO TABS: 1.0000 | ORAL_TABLET | Freq: Four times a day (QID) | ORAL | 0 refills | Status: DC | PRN

## 2021-03-11 MED ORDER — OXYBUTYNIN CHLORIDE 5 MG PO TABS: 5.0000 mg | ORAL_TABLET | Freq: Three times a day (TID) | ORAL | 0 refills | Status: DC | PRN

## 2021-03-11 MED ORDER — SUGAMMADEX SODIUM 200 MG/2ML IV SOLN
INTRAVENOUS | Status: DC | PRN
Start: 2021-03-11 — End: 2021-03-11
  Administered 2021-03-11: 200 mg via INTRAVENOUS

## 2021-03-11 SURGICAL SUPPLY — 41 items
ADAPTER IRRIG TUBE 2 SPIKE SOL (ADAPTER) ×4 IMPLANT
ADPR TBG 2 SPK PMP STRL ASCP (ADAPTER) ×2
BAG DRN LRG CPC RND TRDRP CNTR (MISCELLANEOUS)
BAG DRN RND TRDRP ANRFLXCHMBR (UROLOGICAL SUPPLIES)
BAG URINE DRAIN 2000ML AR STRL (UROLOGICAL SUPPLIES) IMPLANT
BAG URO DRAIN 4000ML (MISCELLANEOUS) IMPLANT
CATH FOL 2WAY LX 20X30 (CATHETERS) IMPLANT
CATH FOL 2WAY LX 22X30 (CATHETERS) IMPLANT
CATH FOL 3WAY LX COUDE 24X30 (CATHETERS) ×1 IMPLANT
CATH FOL 3WAY LX COUV 24X75 (CATHETERS) ×1 IMPLANT
CATH FOLEY 3WAY 30CC 22FR (CATHETERS) IMPLANT
CATH URETL OPEN 5X70 (CATHETERS) ×2 IMPLANT
CONTAINER COLLECT MORCELLATR (MISCELLANEOUS) ×1 IMPLANT
DRAPE 3/4 80X56 (DRAPES) ×2 IMPLANT
DRAPE UTILITY 15X26 TOWEL STRL (DRAPES) IMPLANT
FIBER LASER FLEXIVA PULSE 550 (Laser) ×2 IMPLANT
FILTER OVERFLOW MORCELLATOR (FILTER) ×1 IMPLANT
GAUZE 4X4 16PLY ~~LOC~~+RFID DBL (SPONGE) ×4 IMPLANT
GLOVE SURG ENC MOIS LTX SZ6.5 (GLOVE) ×4 IMPLANT
GOWN STRL REUS W/ TWL LRG LVL3 (GOWN DISPOSABLE) ×2 IMPLANT
GOWN STRL REUS W/TWL LRG LVL3 (GOWN DISPOSABLE) ×4
GUIDEWIRE STR DUAL SENSOR (WIRE) ×1 IMPLANT
HOLDER FOLEY CATH W/STRAP (MISCELLANEOUS) ×2 IMPLANT
IV NS IRRIG 3000ML ARTHROMATIC (IV SOLUTION) ×11 IMPLANT
KIT TURNOVER CYSTO (KITS) ×2 IMPLANT
LOOP CUT BIPOLAR 24F LRG (ELECTROSURGICAL) ×1 IMPLANT
MBRN O SEALING YLW 17 FOR INST (MISCELLANEOUS) ×2
MEMBRANE SLNG YLW 17 FOR INST (MISCELLANEOUS) ×1 IMPLANT
MORCELLATOR COLLECT CONTAINER (MISCELLANEOUS) ×2
MORCELLATOR OVERFLOW FILTER (FILTER) ×2
MORCELLATOR ROTATION 4.75 335 (MISCELLANEOUS) ×2 IMPLANT
PACK CYSTO AR (MISCELLANEOUS) ×2 IMPLANT
SET CYSTO W/LG BORE CLAMP LF (SET/KITS/TRAYS/PACK) IMPLANT
SET IRRIG Y TYPE TUR BLADDER L (SET/KITS/TRAYS/PACK) ×2 IMPLANT
SLEEVE PROTECTION STRL DISP (MISCELLANEOUS) ×4 IMPLANT
SURGILUBE 2OZ TUBE FLIPTOP (MISCELLANEOUS) ×2 IMPLANT
SYR TOOMEY IRRIG 70ML (MISCELLANEOUS) ×2
SYRINGE TOOMEY IRRIG 70ML (MISCELLANEOUS) ×1 IMPLANT
TUBE PUMP MORCELLATOR PIRANHA (TUBING) ×2 IMPLANT
WATER STERILE IRR 1000ML POUR (IV SOLUTION) ×2 IMPLANT
WATER STERILE IRR 500ML POUR (IV SOLUTION) ×2 IMPLANT

## 2021-03-11 NOTE — Anesthesia Procedure Notes (Signed)
Procedure Name: Intubation ?Date/Time: 03/11/2021 7:48 AM ?Performed by: Biagio Borg, CRNA ?Pre-anesthesia Checklist: Patient identified, Emergency Drugs available, Suction available and Patient being monitored ?Patient Re-evaluated:Patient Re-evaluated prior to induction ?Oxygen Delivery Method: Circle system utilized ?Preoxygenation: Pre-oxygenation with 100% oxygen ?Induction Type: IV induction ?Ventilation: Mask ventilation without difficulty ?Tube type: Oral ?Number of attempts: 1 ?Airway Equipment and Method: Stylet and Oral airway ?Placement Confirmation: ETT inserted through vocal cords under direct vision, positive ETCO2 and breath sounds checked- equal and bilateral ?Tube secured with: Tape ?Dental Injury: Teeth and Oropharynx as per pre-operative assessment  ? ? ? ? ?

## 2021-03-11 NOTE — Anesthesia Preprocedure Evaluation (Signed)
Anesthesia Evaluation  ?Patient identified by MRN, date of birth, ID band ?Patient awake ? ? ? ?Reviewed: ?Allergy & Precautions, NPO status , Patient's Chart, lab work & pertinent test results ? ?Airway ?Mallampati: II ? ?TM Distance: >3 FB ?Neck ROM: Full ? ? ? Dental ? ?(+) Teeth Intact ?  ?Pulmonary ?neg pulmonary ROS,  ?  ?Pulmonary exam normal ?breath sounds clear to auscultation ? ? ? ? ? ? Cardiovascular ?Exercise Tolerance: Good ?negative cardio ROS ?Normal cardiovascular exam ?Rhythm:Regular Rate:Normal ? ? ?  ?Neuro/Psych ?negative neurological ROS ? negative psych ROS  ? GI/Hepatic ?negative GI ROS, Neg liver ROS,   ?Endo/Other  ?negative endocrine ROS ? Renal/GU ?  ?negative genitourinary ?  ?Musculoskeletal ? ? Abdominal ?Normal abdominal exam  (+)   ?Peds ?negative pediatric ROS ?(+)  Hematology ?negative hematology ROS ?(+)   ?Anesthesia Other Findings ?Past Medical History: ?No date: CLL (chronic lymphocytic leukemia) ([place]) ?No date: Elevated PSA ?No date: MS (multiple sclerosis) ([place]) ?No date: Pre-diabetes ?2013: Prostate cancer ([place]) ? ?Past Surgical History: ?2002: KNEE SURGERY ?    Comment:  meniscus ? ?BMI   ? Body Mass Index: 33.00 kg/m?  ?  ? ? Reproductive/Obstetrics ?negative OB ROS ? ?  ? ? ? ? ? ? ? ? ? ? ? ? ? ?  ?  ? ? ? ? ? ? ? ? ?Anesthesia Physical ?Anesthesia Plan ? ?ASA: 2 ? ?Anesthesia Plan: General  ? ?Post-op Pain Management:   ? ?Induction: Intravenous ? ?PONV Risk Score and Plan: Ondansetron, Dexamethasone, Midazolam and Treatment may vary due to age or medical condition ? ?Airway Management Planned: Oral ETT ? ?Additional Equipment:  ? ?Intra-op Plan:  ? ?Post-operative Plan: Extubation in OR ? ?Informed Consent: I have reviewed the patients History and Physical, chart, labs and discussed the procedure including the risks, benefits and alternatives for the proposed anesthesia with the patient or authorized representative who has indicated  his/her understanding and acceptance.  ? ? ? ?Dental Advisory Given ? ?Plan Discussed with: CRNA and Surgeon ? ?Anesthesia Plan Comments:   ? ? ? ? ? ? ?Anesthesia Quick Evaluation ? ?

## 2021-03-11 NOTE — Anesthesia Postprocedure Evaluation (Signed)
Anesthesia Post Note ? ?Patient: Ralph [name] ? ?Procedure(s) Performed: HOLEP-LASER ENUCLEATION OF THE PROSTATE WITH MORCELLATION ? ?Patient location during evaluation: PACU ?Anesthesia Type: General ?Level of consciousness: awake and oriented ?Pain management: satisfactory to patient ?Vital Signs Assessment: post-procedure vital signs reviewed and stable ?Respiratory status: spontaneous breathing ?Cardiovascular status: stable ?Anesthetic complications: no ? ? ?No notable events documented. ? ? ?Last Vitals:  ?Vitals:  ? 03/11/21 1037 03/11/21 1045  ?BP:  112/74  ?Pulse: 86 81  ?Resp: 14 15  ?Temp: (!) 36 ?C   ?SpO2: 100% 100%  ?  ?Last Pain:  ?Vitals:  ? 03/11/21 1037  ?TempSrc:   ?PainSc: 0-No pain  ? ? ?  ?  ?  ?  ?  ?  ? ?VAN STAVEREN,Mischa Brittingham ? ? ? ? ?

## 2021-03-11 NOTE — Discharge Instructions (Addendum)
Holmium Laser Enucleation of the Prostate (HoLEP)  HoLEP is a treatment for men with benign prostatic hyperplasia (BPH). The laser surgery removed blockages of urine flow, and is done without any incisions on the body.     What is HoLEP?  HoLEP is a type of laser surgery used to treat obstruction (blockage) of urine flow as a result of benign prostatic hyperplasia (BPH). In men with BPH, the prostate gland is not cancerous, but has become enlarged. An enlarged prostate can result in a number of urinary tract symptoms such as weak urinary stream, difficulty in starting urination, inability to urinate, frequent urination, or getting up at night to urinate.  HoLEP was developed in the 1990's as a more effective and less expensive surgical option for BPH, compared to other surgical options such as laser vaporization(PVP/greenlight laser), transurethral resection of the prostate(TURP), and open simple prostatectomy.   What happens during a HoLEP?  HoLEP requires general anesthesia ("asleep" throughout the procedure).   An antibiotic is given to reduce the risk of infection  A surgical instrument called a resectoscope is inserted through the urethra (the tube that carries urine from the bladder). The resectoscope has a camera that allows the surgeon to view the internal structure of the prostate gland, and to see where the incisions are being made during surgery.  The laser is inserted into the resectoscope and is used to enucleate (free up) the enlarged prostate tissue from the capsule (outer shell) and then to seal up any blood vessels. The tissue that has been removed is pushed back into the bladder.  A morcellator is placed through the resectoscope, and is used to suction out the prostate tissue that has been pushed into the bladder.  When the prostate tissue has been removed, the resectoscope is removed, and a foley catheter is placed to allow healing and drain the urine from the  bladder.     What happens after a HoLEP?  More than 90% of patients go home the same day a few hours after surgery. Less than 10% will be admitted to the hospital overnight for observation to monitor the urine, or if they have other medical problems.  Fluid is flushed through the catheter for about 1 hour after surgery to clear any blood from the urine. It is normal to have some blood in the urine after surgery. The need for blood transfusion is extremely rare.  Eating and drinking are permitted after the procedure once the patient has fully awakened from anesthesia.  The catheter is usually removed 2-3 days after surgery- the patient will come to clinic to have the catheter removed and make sure they can urinate on their own.  It is very important to drink lots of fluids after surgery for one week to keep the bladder flushed.  At first, there may be some burning with urination, but this typically improved within a few hours to days. Most patients do not have a significant amount of pain, and narcotic pain medications are rarely needed.  Symptoms of urinary frequency, urgency, and even leakage are NORMAL for the first few weeks after surgery as the bladder adjusts after having to work hard against blockage from the prostate for many years. This will improve, but can sometimes take several months.  The use of pelvic floor exercises (Kegel exercises) can help improve problems with urinary incontinence.   After catheter removal, patients will be seen at 6 weeks and 6 months for symptom check  No heavy lifting for   at least 2-3 weeks after surgery, however patients can walk and do light activities the first day after surgery. Return to work time depends on occupation.    What are the advantages of HoLEP?  HoLEP has been studied in many different parts of the world and has been shown to be a safe and effective procedure. Although there are many types of BPH surgeries available, HoLEP offers a  unique advantage in being able to remove a large amount of tissue without any incisions on the body, even in very large prostates, while decreasing the risk of bleeding and providing tissue for pathology (to look for cancer). This decreases the need for blood transfusions during surgery, minimizes hospital stay, and reduces the risk of needing repeat treatment.  What are the side effects of HoLEP?  Temporary burning and bleeding during urination. Some blood may be seen in the urine for weeks after surgery and is part of the healing process.  Urinary incontinence (inability to control urine flow) is expected in all patients immediately after surgery and they should wear pads for the first few days/weeks. This typically improves over the course of several weeks. Performing Kegel exercises can help decrease leakage from stress maneuvers such as coughing, sneezing, or lifting. The rate of long term leakage is very low. Patients may also have leakage with urgency and this may be treated with medication. The risk of urge incontinence can be dependent on several factors including age, prostate size, symptoms, and other medical problems.  Retrograde ejaculation or "backwards ejaculation." In 75% of cases, the patient will not see any fluid during ejaculation after surgery.  Erectile function is generally not significantly affected.   What are the risks of HoLEP?  Injury to the urethra or development of scar tissue at a later date  Injury to the capsule of the prostate (typically treated with longer catheterization).  Injury to the bladder or ureteral orifices (where the urine from the kidney drains out)  Infection of the bladder, testes, or kidneys  Return of urinary obstruction at a later date requiring another operation (<2%)  Need for blood transfusion or re-operation due to bleeding  Failure to relieve all symptoms and/or need for prolonged catheterization after surgery  5-15% of patients are  found to have previously undiagnosed prostate cancer in their specimen. Prostate cancer can be treated after HoLEP.  Standard risks of anesthesia including blood clots, heart attacks, etc  When should I call my doctor?  Fever over 101.3 degrees  Inability to urinate, or large blood clots in the urine  AMBULATORY SURGERY  DISCHARGE INSTRUCTIONS   The drugs that you were given will stay in your system until tomorrow so for the next 24 hours you should not:  Drive an automobile Make any legal decisions Drink any alcoholic beverage   You may resume regular meals tomorrow.  Today it is better to start with liquids and gradually work up to solid foods.  You may eat anything you prefer, but it is better to start with liquids, then soup and crackers, and gradually work up to solid foods.   Please notify your doctor immediately if you have any unusual bleeding, trouble breathing, redness and pain at the surgery site, drainage, fever, or pain not relieved by medication.    Additional Instructions:        Please contact your physician with any problems or Same Day Surgery at [phone], Monday through Friday 6 am to 4 pm, or [place] [facility] [place] Main number   at [phone].  

## 2021-03-11 NOTE — Transfer of Care (Signed)
Immediate Anesthesia Transfer of Care Note ? ?Patient: Ralph [name] ? ?Procedure(s) Performed: HOLEP-LASER ENUCLEATION OF THE PROSTATE WITH MORCELLATION ? ?Patient Location: PACU ? ?Anesthesia Type:General ? ?Level of Consciousness: awake ? ?Airway & Oxygen Therapy: Patient Spontanous Breathing ? ?Post-op Assessment: Report given to RN and Post -op Vital signs reviewed and stable ? ?Post vital signs: Reviewed and stable ? ?Last Vitals:  ?Vitals Value Taken Time  ?BP 111/83 03/11/21 1035  ?Temp    ?Pulse 87 03/11/21 1038  ?Resp 17 03/11/21 1038  ?SpO2 100 % 03/11/21 1038  ?Vitals shown include unvalidated device data. ? ?Last Pain:  ?Vitals:  ? 03/11/21 0636  ?TempSrc: Temporal  ?PainSc: 0-No pain  ?   ? ?  ? ?Complications: No notable events documented. ?

## 2021-03-11 NOTE — Op Note (Signed)
Date of procedure: 03/11/21 ? ?Preoperative diagnosis:  ?BPH with BOO ? ?Postoperative diagnosis:  ?same  ? ?Procedure: ?HoLEP with morcellation ? ?Surgeon: Hollice Espy, MD ? ?Anesthesia: General ? ?Complications: None ? ?Intraoperative findings: Massively elevated bladder neck with large median lobe.  Significant oozing throughout the entirety of the procedure.  Prostate inflamed.  Required utilization of bipolar for reasonable hemostasis.  Some residual lateral lobe tissue remained at the end of the procedure. ? ?EBL: 200 ? ?Specimens: Prostate chips ? ?Drains: 24 French three-way hematuria catheter with 75 cc balloon ? ?Indication: Ralph [name] is a 56 y.o. patient with urinary retention, massive prostamegaly.  After reviewing the management options for treatment, he elected to proceed with the above surgical procedure(s). We have discussed the potential benefits and risks of the procedure, side effects of the proposed treatment, the likelihood of the patient achieving the goals of the procedure, and any potential problems that might occur during the procedure or recuperation. Informed consent has been obtained. ? ?Description of procedure: ? ?The patient was taken to the operating room and general anesthesia was induced.  The patient was placed in the dorsal lithotomy position, prepped and draped in the usual sterile fashion, and preoperative antibiotics were administered. A preoperative time-out was performed.  ?  ? A 26 French resectoscope sheath using a blunt angled obturator was introduced  into the bladder.  Notably, placing the scope was somewhat difficult and end up having to use a visual obturator to navigate it over very high bladder neck.  Initially, the trigone was not visible. The prostatic fossa had significant trilobar coaptation with greater than 5 cm prostatic length.  A 550 ?m laser fiber was then brought in and using settings of 2 J's and 60 Hz, 2 incisions were created at the 5:00 and  7:00 positions of the bladder neck on either side of the median lobe down to the level of the bladder neck/capsular fibers.  The incision was carried down caudally meeting in the midline just above the verumontanum.  The median lobe was then enucleated from a caudal to cranial direction cleaving the adenoma off the underlying capsule rolling it towards the bladder neck and ultimately cleaving the mucosa to free the median lobe into the bladder.  This point, I could see the trigone and both of the UOs were visible with clear E flux from both. ?  ?I then attempted to enucleate each of the lateral lobes only moderately successful.  I created a semilunar incision just proximal to the left verumontanum but ended up enucleating a BPH nodule rather than the lobe.  I ended up piece wise resecting tissue on the left lateral lobe.  I ended up doing the same on the right.  There is a little bit of undermining of the right prostatic fossa close to but not involving the bladder neck.  Again, the UO was inspected and effluxing clear yellow urine.  Visualization was very poor at this point in time due to ongoing significant bleeding.  I enucleated BPH nodules on the side and at this point time, made the decision to go ahead and morcellate the large median lobe and clots along with these BPH nodules and then try to get better hemostasis.  I brought in a resectoscope and using 2 inflows, was able to achieve enough hemostasis in order to morcellate the large median lobe and additional tissue.  10 mg of IV Lasix was given which helped a little with visualization.  There continue  be significant bleeding from the prostatic fossa and bladder neck however. ? ?I then exchanged the scope for resectoscope and using a large loop, ended up resecting a little bit more prostate and a TURP fashion which helped to better expose some of the bleeding vessels and then spent quite some time working to achieve hemostasis by fulgurating the entirety of  the prostatic fossa and around the bladder neck.  The morcellator was brought back in and a few more pieces were morcellated in the bladder.  Finally, I remove the scope.  Had some difficulty advancing the catheter and even using a catheter guide and ultimately ended up placing a 24 [place] hematuria catheter over a wire into the bladder.  It was irrigated several times.  The balloon was inflated with 75 cc of sterile water.  It was then placed on tension.  I then initiated CBI.  The patient was taken to the PACU in stable condition. ? ?Plan: We will keep him on CBI for about an hour.  We will try to titrate this off.  If successful, will the third port and send him home with a catheter for a week.  Otherwise, we will keep him overnight for observation. ? ?  ? ?Hollice Espy, M.D. ? ?

## 2021-03-11 NOTE — Interval H&P Note (Signed)
Hp up-to-date ? ?Regular rate and rhythm ?Clear to auscultation bilaterally ? ?All questions answered.

## 2021-03-12 ENCOUNTER — Encounter: Payer: Self-pay | Admitting: Urology

## 2021-03-12 LAB — SURGICAL PATHOLOGY

## 2021-03-14 ENCOUNTER — Ambulatory Visit: Payer: Federal, State, Local not specified - PPO | Admitting: Urology

## 2021-03-19 ENCOUNTER — Ambulatory Visit: Payer: Federal, State, Local not specified - PPO | Admitting: Urology

## 2021-03-19 ENCOUNTER — Other Ambulatory Visit: Payer: Self-pay

## 2021-03-19 DIAGNOSIS — R338 Other retention of urine: Secondary | ICD-10-CM

## 2021-03-19 DIAGNOSIS — C61 Malignant neoplasm of prostate: Secondary | ICD-10-CM

## 2021-03-19 NOTE — Progress Notes (Signed)
Catheter Removal ? ?Patient is present today for a catheter removal.  1m of water was drained from the balloon. A 24FR foley cath was removed from the bladder no complications were noted . Patient tolerated well. ? ?Performed by: CElberta Leatherwood CMA ? ?Follow up/ Additional notes: 6 weeks  ?

## 2021-03-20 ENCOUNTER — Ambulatory Visit: Payer: Federal, State, Local not specified - PPO | Admitting: Urology

## 2021-03-20 DIAGNOSIS — N138 Other obstructive and reflux uropathy: Secondary | ICD-10-CM | POA: Diagnosis not present

## 2021-03-20 DIAGNOSIS — R338 Other retention of urine: Secondary | ICD-10-CM

## 2021-03-20 DIAGNOSIS — N401 Enlarged prostate with lower urinary tract symptoms: Secondary | ICD-10-CM

## 2021-03-20 LAB — BLADDER SCAN AMB NON-IMAGING

## 2021-03-20 NOTE — Progress Notes (Signed)
? ? ?03/20/2021 ?5:04 PM  ? ?Ralph [name] ?04-Sep-1965 ?[id number] ? ?Referring provider: [place] ?[place],  [place] [address] ? ?Chief Complaint  ?Patient presents with  ? Urinary Retention  ? ?Urological history: ?1. Prostate cancer ?-PSA 9.86 01/2021 ?-iPSA 5.2 prostate biopsies 09/2011 and 12/2011 - 1/12 Gleason 3+3, small volume, left base and left mid ?-iPSA 8.31 prostate biopsy 2019- rid mid suspicious for carcinoma ?-managed with AS ?-was followed by Dr. Fara Chute at [facility]  ?-HoLEP 03/11/2021 - pathology negative for adenocarcinoma, but prostatic involvement by chronic lymphocytic leukemia ?  ?2. BPH with retention ?-MR 2018 - prostate volume ~116 cc ?-presented to ED 01/2021 with inability to void -CT severe bilateral hydronephrosis and hydroureter to the UVJ's with distended bladder -Foley placed with return 1500 cc yellow clear urine ?-cysto 02/12/2021 -massive prostate with friable prostatic mucosa, trilobar coaptation, moderate trabeculation and a large very well-circumscribed median lobe ?-HoLEP 03/11/2021 ?  ?3.  Nephrolithiasis ?-Small nonobstructive calculus in the left kidney on CT study dated January 2023 ? ?HPI: ?Ralph [name] is a 56 y.o. male who presents today for follow up after catheter was removed yesterday after undergoing a HoLEP procedure on 03/11/2021. ?  ?He presents this afternoon stating he has been unable to urinate since last evening.   ?  ?PVR > 999 mL ? ?PMH: ?Past Medical History:  ?Diagnosis Date  ? CLL (chronic lymphocytic leukemia) ([place])   ? Elevated PSA   ? MS (multiple sclerosis) ([place])   ? Pre-diabetes   ? Prostate cancer [facility]) 2013  ? ? ?Surgical History: ?Past Surgical History:  ?Procedure Laterality Date  ? HOLEP-LASER ENUCLEATION OF THE PROSTATE WITH MORCELLATION N/A 03/11/2021  ? Procedure: HOLEP-LASER ENUCLEATION OF THE PROSTATE WITH MORCELLATION;  Surgeon: Hollice Espy, MD;  Location: [facility] ORS;  Service: Urology;  Laterality: N/A;  ?  KNEE SURGERY  2002  ? meniscus  ? ? ?Home Medications:  ?Allergies as of 03/20/2021   ?No Known Allergies ?  ? ?  ?Medication List  ?  ? ?  ? Accurate as of March 20, 2021 11:59 PM. If you have any questions, ask your nurse or doctor.  ?  ?  ? ?  ? ?finasteride 5 MG tablet ?Commonly known as: PROSCAR ?Take 5 mg by mouth daily. ?  ?HYDROcodone-acetaminophen 5-325 MG tablet ?Commonly known as: NORCO/VICODIN ?Take 1-2 tablets by mouth every 6 (six) hours as needed for moderate pain. ?  ?oxybutynin 5 MG tablet ?Commonly known as: DITROPAN ?Take 1 tablet (5 mg total) by mouth every 8 (eight) hours as needed for bladder spasms. ?  ?rosuvastatin 5 MG tablet ?Commonly known as: CRESTOR ?Take 1 tablet (5 mg total) by mouth at bedtime. ?  ?tamsulosin 0.4 MG Caps capsule ?Commonly known as: FLOMAX ?Take 0.4 mg by mouth daily. ?  ? ?  ? ? ?Allergies: No Known Allergies ? ?Family History: ?Family History  ?Problem Relation Age of Onset  ? Cancer Sister   ?     breast CA   ? ? ?Social History:  reports that he has never smoked. He has never used smokeless tobacco. He reports that he does not drink alcohol and does not use drugs. ? ?ROS: ?Pertinent ROS in HPI ? ?Physical Exam: ?Constitutional:  Well nourished. Alert and oriented, No acute distress. ?HEENT: [place] AT, mask in place.  Trachea midline ?Cardiovascular: No clubbing, cyanosis, or edema. ?Respiratory: Normal respiratory effort, no increased work of breathing. ?Neurologic: Grossly intact,  no focal deficits, moving all 4 extremities. ?Psychiatric: Normal mood and affect. ? ?Laboratory Data: ?Lab Results  ?Component Value Date  ? WBC 56.7 (HH) 02/11/2021  ? HGB 13.1 02/11/2021  ? HCT 41.5 02/11/2021  ? MCV 87.9 02/11/2021  ? PLT 245 02/11/2021  ? ? ?Lab Results  ?Component Value Date  ? CREATININE 1.19 02/11/2021  ? ?Lab Results  ?Component Value Date  ? HGBA1C 6.3 (H) 01/22/2021  ? ? ?   ?Component Value Date/Time  ? CHOL 256 (H) 01/22/2021 7262  ? HDL 29 (L) 01/22/2021 0355  ?  CHOLHDL 8.8 01/22/2021 0609  ? VLDL 27 01/22/2021 0609  ? [place] 200 (H) 01/22/2021 9741  ? ? ?Lab Results  ?Component Value Date  ? AST 22 02/11/2021  ? ?Lab Results  ?Component Value Date  ? ALT 22 02/11/2021  ? ? ?Urinalysis ?   ?Component Value Date/Time  ? COLORURINE YELLOW 01/21/2021 0946  ? APPEARANCEUR Hazy (A) 02/26/2021 0915  ? LABSPEC 1.010 01/21/2021 0946  ? PHURINE 5.5 01/21/2021 0946  ? GLUCOSEU Negative 02/26/2021 0915  ? HGBUR TRACE (A) 01/21/2021 0946  ? BILIRUBINUR Negative 02/26/2021 0915  ? [place] NEGATIVE 01/21/2021 0946  ? PROTEINUR Trace (A) 02/26/2021 0915  ? PROTEINUR NEGATIVE 01/21/2021 0946  ? NITRITE Negative 02/26/2021 0915  ? NITRITE NEGATIVE 01/21/2021 0946  ? LEUKOCYTESUR 1+ (A) 02/26/2021 0915  ? LEUKOCYTESUR NEGATIVE 01/21/2021 0946  ?I have reviewed the labs. ? ? ?Pertinent Imaging: ? 03/20/21 11:47  ?Scan Result >926m  ? ?Simple Catheter Placement ?Due to urinary retention patient is present today for a foley cath placement.  Patient was cleaned and prepped in a sterile fashion with betadine. A 18 FR [place] foley catheter was inserted, urine return was noted  1300 ml, urine was brown clear in color.  The balloon was filled with 10cc of sterile water.  A leg bag was attached for drainage. Patient was also given a night bag to take home and was given instruction on how to change from one bag to another.  Patient was given instruction on proper catheter care.  Patient tolerated well, no complications were noted  ? ? ?Assessment & Plan:   ? ?1. Urinary retention ?-Failed TOV after HoLEP ?-Foley back in place ?-He would like to return next week for a repeat TOV and PVR ?-If he fails repeat trial of void, may consider UDS to evaluate the effects of his multiple sclerosis on his bladder functioning at this time ?  ?2. Prostate cancer ?-03/2021 HoLEP pathology noted no adenocarcinoma, but did note prostatic infiltration of his chronic lymphocytic leukemia ? ? ?Return in about 1 week  (around 03/27/2021) for TOV and PVR . ? ?These notes generated with voice recognition software. I apologize for typographical errors. ? ?Dahlton Hinde, PA-C ? ?[place]?[address] [place] [address]?[phone]?  ?

## 2021-03-24 ENCOUNTER — Encounter: Payer: Self-pay | Admitting: Urology

## 2021-03-24 NOTE — Progress Notes (Addendum)
Error

## 2021-03-27 ENCOUNTER — Ambulatory Visit: Payer: Federal, State, Local not specified - PPO | Admitting: Urology

## 2021-03-27 ENCOUNTER — Other Ambulatory Visit: Payer: Self-pay

## 2021-03-27 DIAGNOSIS — C61 Malignant neoplasm of prostate: Secondary | ICD-10-CM

## 2021-03-27 DIAGNOSIS — N138 Other obstructive and reflux uropathy: Secondary | ICD-10-CM

## 2021-03-27 DIAGNOSIS — N401 Enlarged prostate with lower urinary tract symptoms: Secondary | ICD-10-CM | POA: Diagnosis not present

## 2021-03-27 LAB — BLADDER SCAN AMB NON-IMAGING

## 2021-03-27 NOTE — Progress Notes (Signed)
Patient is present today for a catheter removal. 74m of water was drained from the balloon. A 18FR foley cath was removed from the bladder no complications were noted. The presence of a small amount of blood was noted upon catheter removal, but none afterwards. Patient tolerated well. ? ?Preformed by CConcepcion Living LPN ?

## 2021-03-27 NOTE — Patient Instructions (Signed)
Follow up as scheduled this afternoon. Keep a record of any urinary output.  ?

## 2021-04-03 ENCOUNTER — Telehealth: Payer: Self-pay | Admitting: Urology

## 2021-04-03 NOTE — Telephone Encounter (Signed)
Will you call [name] and ask have the self cathing is going for him? ?

## 2021-04-03 NOTE — Progress Notes (Addendum)
? ? ?03/27/2021 ?4:30 PM  ? ?Ralph [name] ?17-Jul-1965 ?[id number] ? ?Referring provider: [place] ?[place] [address] ? ?Chief Complaint  ?Patient presents with  ? Benign Prostatic Hypertrophy  ? ?Urological history: ?1. Prostate cancer ?-PSA 9.86 01/2021 ?-iPSA 5.2 prostate biopsies 09/2011 and 12/2011 - 1/12 Gleason 3+3, small volume, left base and left mid ?-iPSA 8.31 prostate biopsy 2019- rid mid suspicious for carcinoma ?-managed with AS ?-was followed by Dr. Fara Chute at [facility]  ?-HoLEP 03/11/2021 - pathology negative for adenocarcinoma, but prostatic involvement by chronic lymphocytic leukemia ?  ?2. BPH with retention ?-MR 2018 - prostate volume ~116 cc ?-presented to ED 01/2021 with inability to void -CT severe bilateral hydronephrosis and hydroureter to the UVJ's with distended bladder -Foley placed with return 1500 cc yellow clear urine ?-cysto 02/12/2021 -massive prostate with friable prostatic mucosa, trilobar coaptation, moderate trabeculation and a large very well-circumscribed median lobe ?-HoLEP 03/11/2021 ?  ?3.  Nephrolithiasis ?-Small nonobstructive calculus in the left kidney on CT study dated January 2023 ? ?HPI: ?Ralph [name] is a 56 y.o. male who presents today for follow up after catheter was removed this morning for a second trial of void after undergoing a HoLEP procedure on 03/11/2021. ?  ?He failed his first trial of void on 03/20/2021.  He has been urinating today, but his PVR is 344 mL.  He does not feel uncomfortable, so this may be his baseline PVR.   ? ?He has a follow up schedule on 04/30/2021 with Dr. Erlene Quan and an appointment with Dr. Rogue Bussing in May. ? ? ?PMH: ?Past Medical History:  ?Diagnosis Date  ? CLL (chronic lymphocytic leukemia) ([place])   ? Elevated PSA   ? MS (multiple sclerosis) ([place])   ? Pre-diabetes   ? Prostate cancer [facility]) 2013  ? ? ?Surgical History: ?Past Surgical History:  ?Procedure Laterality Date  ? HOLEP-LASER  ENUCLEATION OF THE PROSTATE WITH MORCELLATION N/A 03/11/2021  ? Procedure: HOLEP-LASER ENUCLEATION OF THE PROSTATE WITH MORCELLATION;  Surgeon: Hollice Espy, MD;  Location: [facility] ORS;  Service: Urology;  Laterality: N/A;  ? KNEE SURGERY  2002  ? meniscus  ? ? ?Home Medications:  ?Allergies as of 03/27/2021   ?No Known Allergies ?  ? ?  ?Medication List  ?  ? ?  ? Accurate as of March 27, 2021 11:59 PM. If you have any questions, ask your nurse or doctor.  ?  ?  ? ?  ? ?finasteride 5 MG tablet ?Commonly known as: PROSCAR ?Take 5 mg by mouth daily. ?  ?HYDROcodone-acetaminophen 5-325 MG tablet ?Commonly known as: NORCO/VICODIN ?Take 1-2 tablets by mouth every 6 (six) hours as needed for moderate pain. ?  ?oxybutynin 5 MG tablet ?Commonly known as: DITROPAN ?Take 1 tablet (5 mg total) by mouth every 8 (eight) hours as needed for bladder spasms. ?  ?rosuvastatin 5 MG tablet ?Commonly known as: CRESTOR ?Take 1 tablet (5 mg total) by mouth at bedtime. ?  ?tamsulosin 0.4 MG Caps capsule ?Commonly known as: FLOMAX ?Take 0.4 mg by mouth daily. ?  ? ?  ? ? ?Allergies: No Known Allergies ? ?Family History: ?Family History  ?Problem Relation Age of Onset  ? Cancer Sister   ?     breast CA   ? ? ?Social History:  reports that he has never smoked. He has never used smokeless tobacco. He reports that he does not drink alcohol and does not use drugs. ? ?ROS: ?Pertinent  ROS in HPI ? ?Physical Exam: ?Constitutional:  Well nourished. Alert and oriented, No acute distress. ?HEENT: [place] AT, mask in place.  Trachea midline ?Cardiovascular: No clubbing, cyanosis, or edema. ?Respiratory: Normal respiratory effort, no increased work of breathing. ?Neurologic: Grossly intact, no focal deficits, moving all 4 extremities. ?Psychiatric: Normal mood and affect.  ? ?Laboratory Data: ?N/A ? ?Pertinent Imaging: ? 03/27/21 15:12  ?Scan Result 396m  ? ?I instructed him on self catheterization technique and he was able to catheterize himself in the  office today successfully. ? ?Assessment & Plan:   ? ?1. Urinary retention ?-Failed TOV after HoLEP ?-Foley back in place ?-He would like to return next week for a repeat TOV and PVR ?-If he fails repeat trial of void, may consider UDS to evaluate the effects of his multiple sclerosis on his bladder functioning at this time ?  ?2. Prostate cancer ?-03/2021 HoLEP pathology noted no adenocarcinoma, but did note prostatic infiltration of his chronic lymphocytic leukemia ? ? ?Return for Keep follow up with Dr. BErlene Quanon 04/30/2021. ? ?These notes generated with voice recognition software. I apologize for typographical errors. ? ?Jordanny Waddington, PA-C ? ?[place] [place] [address]?[phone]?  ?I spent 30 minutes on the day of the encounter to include pre-visit record review, face-to-face time with the patient, and post-visit ordering of tests.  ?

## 2021-04-04 NOTE — Telephone Encounter (Signed)
.  left message to have patient return my call.  

## 2021-04-17 ENCOUNTER — Encounter: Payer: Self-pay | Admitting: Urology

## 2021-04-29 NOTE — Progress Notes (Signed)
? ?04/30/2021 ?9:00 AM  ? ?Jory Ee Hagarty ?08-27-1965 ?[id number] ? ?Referring provider:  ?[place] ?[place],  [place] [address] ?Chief Complaint  ?Patient presents with  ? Post-op Follow-up  ?  6 wk w/IPSS PVR  ? ? ? ? ?HPI: ?Ralph [name] is a 56 y.o.male with a personal history of prostate cancer, BPH with retention, and nephrolithiasis.  ? ?He has a personal history of low risk prostate cancer, Gleason 3+3 on active surveillance.  He underwent Biopsy in 2019 that showed rid mid suspicious for carcinoma. He is manages with AS and is followed by Dr. Fara Chute at [facility].  ? ?CT on 01/2021 visualized sever bilateral hydronephrosis and hydroureter to the UVJs with distended bladder. Cysto 02/12/2021 -massive prostate with friable prostatic mucosa, trilobar coaptation, moderate trabeculation and a large very well-circumscribed median lobe ? ?He is s/p HoLEP 03/11/2021 - pathology negative for adenocarcinoma, but prostatic involvement by chronic lymphocytic leukemia.  ? ?He failed TOV after HoLEP. He foley was replaced with Ralph Council, PA-C, on 03/27/2021.  Since then, he was taught how to CIC but is no longer having to do this. ? ?He reports a steady strong stream, he is able to urinate and he feels that he is emptying his bladder. He denies burning, bleeding, or leaking. He reports slight urinary urgency.  ? ?He has seen Dr. [name] as well in the interim about management of his prostate cancer.  Continued active surveillance was recommended. ? ? IPSS   ? ? [place] Name 04/30/21 0800  ?  ?  ?  ? International Prostate Symptom Score  ? How often have you had the sensation of not emptying your bladder? Not at All    ? How often have you had to urinate less than every two hours? Less than 1 in 5 times    ? How often have you found you stopped and started again several times when you urinated? Not at All    ? How often have you found it difficult to postpone urination? Less than 1 in 5 times     ? How often have you had a weak urinary stream? Less than 1 in 5 times    ? How often have you had to strain to start urination? Not at All    ? How many times did you typically get up at night to urinate? 2 Times    ? Total IPSS Score 5    ?  ? Quality of Life due to urinary symptoms  ? If you were to spend the rest of your life with your urinary condition just the way it is now how would you feel about that? Pleased    ? ?  ?  ? ?  ? ? ?Score:  ?1-7 Mild ?8-19 Moderate ?20-35 Severe ? ?PMH: ?Past Medical History:  ?Diagnosis Date  ? CLL (chronic lymphocytic leukemia) ([place])   ? Elevated PSA   ? MS (multiple sclerosis) ([place])   ? Pre-diabetes   ? Prostate cancer [facility]) 2013  ? ? ?Surgical History: ?Past Surgical History:  ?Procedure Laterality Date  ? HOLEP-LASER ENUCLEATION OF THE PROSTATE WITH MORCELLATION N/A 03/11/2021  ? Procedure: HOLEP-LASER ENUCLEATION OF THE PROSTATE WITH MORCELLATION;  Surgeon: Hollice Espy, MD;  Location: [facility] ORS;  Service: Urology;  Laterality: N/A;  ? KNEE SURGERY  2002  ? meniscus  ? ? ?Home Medications:  ?Allergies as of 04/30/2021   ?No Known Allergies ?  ? ?  ?  Medication List  ?  ? ?  ? Accurate as of April 30, 2021  9:00 AM. If you have any questions, ask your nurse or doctor.  ?  ?  ? ?  ? ?STOP taking these medications   ? ?HYDROcodone-acetaminophen 5-325 MG tablet ?Commonly known as: NORCO/VICODIN ?Stopped by: Hollice Espy, MD ?  ?oxybutynin 5 MG tablet ?Commonly known as: DITROPAN ?Stopped by: Hollice Espy, MD ?  ? ?  ? ?TAKE these medications   ? ?finasteride 5 MG tablet ?Commonly known as: PROSCAR ?Take 5 mg by mouth daily. ?  ?rosuvastatin 5 MG tablet ?Commonly known as: CRESTOR ?Take 1 tablet (5 mg total) by mouth at bedtime. ?  ?tamsulosin 0.4 MG Caps capsule ?Commonly known as: FLOMAX ?Take 0.4 mg by mouth daily. ?  ? ?  ? ? ?Allergies:  ?No Known Allergies ? ?Family History: ?Family History  ?Problem Relation Age of Onset  ? Cancer Sister   ?     breast CA    ? ? ?Social History:  reports that he has never smoked. He has never used smokeless tobacco. He reports that he does not drink alcohol and does not use drugs. ? ? ?Physical Exam: ?BP 135/81   Pulse 75   Ht '5\' 10"'$  (1.778 m)   Wt 230 lb (104.3 kg)   BMI 33.00 kg/m?   ?Constitutional:  Alert and oriented, No acute distress. ?HEENT: [place] AT, moist mucus membranes.  Trachea midline, no masses. ?Cardiovascular: No clubbing, cyanosis, or edema. ?Respiratory: Normal respiratory effort, no increased work of breathing. ?Skin: No rashes, bruises or suspicious lesions. ?Neurologic: Grossly intact, no focal deficits, moving all 4 extremities. ?Psychiatric: Normal mood and affect. ? ?Laboratory Data: ?Lab Results  ?Component Value Date  ? CREATININE 1.19 02/11/2021  ? ?Lab Results  ?Component Value Date  ? HGBA1C 6.3 (H) 01/22/2021  ? ? ?Pertinent Imaging: ?Results for orders placed or performed in visit on 04/30/21  ?BLADDER SCAN AMB NON-IMAGING  ?Result Value Ref Range  ? Scan Result 67m   ? ? ?Assessment & Plan:   ? ?BPH with retention  ?- S/p HoLEP, post-operative symptoms continue to improve  ?- He is emptying adequately today with a PVR of 863m  ?- Discussed that if he experiences retention he should follow-up sooner to clinic  ?- Work note was given today with return date of Monday ?-Okay to discontinue all BPH medications as well as oxybutynin given that he is emptying well with dramatic improvement in his urinary symptoms ? ?2. Prostate cancer  ?- s/p HoLEP pathology showed prostatic infiltration of his chronic lymphocytic leukemia; after BrRogue Bussingas been made aware and he has follow-up scheduled ?-Agree with plan to continue active surveillance, we will recheck PSA in 6 months postop to establish new PSA nadir ?- PSA; 6 months.  ? ? ?I,Kailey Littlejohn,acting as a scribe for AsHollice EspyMD.,have documented all relevant documentation on the behalf of AsHollice EspyMD,as directed by  AsHollice EspyMD  while in the presence of AsHollice EspyMD. ? ?I have reviewed the above documentation for accuracy and completeness, and I agree with the above.  ? ?AsHollice EspyMD ? ? ?[place] ?[place] [phone]

## 2021-04-30 ENCOUNTER — Ambulatory Visit: Payer: Federal, State, Local not specified - PPO | Admitting: Urology

## 2021-04-30 ENCOUNTER — Encounter: Payer: Self-pay | Admitting: Urology

## 2021-04-30 VITALS — BP 135/81 | HR 75 | Ht 70.0 in | Wt 230.0 lb

## 2021-04-30 DIAGNOSIS — C9110 Chronic lymphocytic leukemia of B-cell type not having achieved remission: Secondary | ICD-10-CM | POA: Diagnosis not present

## 2021-04-30 DIAGNOSIS — Z87438 Personal history of other diseases of male genital organs: Secondary | ICD-10-CM | POA: Diagnosis not present

## 2021-04-30 DIAGNOSIS — C61 Malignant neoplasm of prostate: Secondary | ICD-10-CM

## 2021-04-30 DIAGNOSIS — N138 Other obstructive and reflux uropathy: Secondary | ICD-10-CM

## 2021-04-30 LAB — BLADDER SCAN AMB NON-IMAGING

## 2021-05-14 ENCOUNTER — Inpatient Hospital Stay (HOSPITAL_BASED_OUTPATIENT_CLINIC_OR_DEPARTMENT_OTHER): Payer: Federal, State, Local not specified - PPO | Admitting: Internal Medicine

## 2021-05-14 ENCOUNTER — Inpatient Hospital Stay: Payer: Federal, State, Local not specified - PPO | Attending: Internal Medicine

## 2021-05-14 ENCOUNTER — Encounter: Payer: Self-pay | Admitting: Internal Medicine

## 2021-05-14 VITALS — BP 120/79 | HR 78 | Temp 99.6°F | Ht 70.0 in | Wt 225.6 lb

## 2021-05-14 DIAGNOSIS — G35 Multiple sclerosis: Secondary | ICD-10-CM | POA: Insufficient documentation

## 2021-05-14 DIAGNOSIS — Z8546 Personal history of malignant neoplasm of prostate: Secondary | ICD-10-CM | POA: Diagnosis not present

## 2021-05-14 DIAGNOSIS — C9110 Chronic lymphocytic leukemia of B-cell type not having achieved remission: Secondary | ICD-10-CM

## 2021-05-14 DIAGNOSIS — Z79899 Other long term (current) drug therapy: Secondary | ICD-10-CM | POA: Diagnosis not present

## 2021-05-14 LAB — CBC WITH DIFFERENTIAL/PLATELET
Abs Immature Granulocytes: 0.18 10*3/uL — ABNORMAL HIGH (ref 0.00–0.07)
Basophils Absolute: 0.1 10*3/uL (ref 0.0–0.1)
Basophils Relative: 0 %
Eosinophils Absolute: 0.2 10*3/uL (ref 0.0–0.5)
Eosinophils Relative: 0 %
HCT: 42.2 % (ref 39.0–52.0)
Hemoglobin: 13 g/dL (ref 13.0–17.0)
Immature Granulocytes: 0 %
Lymphocytes Relative: 80 %
Lymphs Abs: 59.2 10*3/uL — ABNORMAL HIGH (ref 0.7–4.0)
MCH: 25.7 pg — ABNORMAL LOW (ref 26.0–34.0)
MCHC: 30.8 g/dL (ref 30.0–36.0)
MCV: 83.4 fL (ref 80.0–100.0)
Monocytes Absolute: 10 10*3/uL — ABNORMAL HIGH (ref 0.1–1.0)
Monocytes Relative: 13 %
Neutro Abs: 5.4 10*3/uL (ref 1.7–7.7)
Neutrophils Relative %: 7 %
Platelets: 249 10*3/uL (ref 150–400)
RBC: 5.06 MIL/uL (ref 4.22–5.81)
RDW: 15.3 % (ref 11.5–15.5)
Smear Review: NORMAL
WBC: 75 10*3/uL (ref 4.0–10.5)
nRBC: 0 % (ref 0.0–0.2)

## 2021-05-14 LAB — COMPREHENSIVE METABOLIC PANEL
ALT: 58 U/L — ABNORMAL HIGH (ref 0–44)
AST: 38 U/L (ref 15–41)
Albumin: 4.5 g/dL (ref 3.5–5.0)
Alkaline Phosphatase: 91 U/L (ref 38–126)
Anion gap: 7 (ref 5–15)
BUN: 19 mg/dL (ref 6–20)
CO2: 26 mmol/L (ref 22–32)
Calcium: 9.4 mg/dL (ref 8.9–10.3)
Chloride: 102 mmol/L (ref 98–111)
Creatinine, Ser: 1.23 mg/dL (ref 0.61–1.24)
GFR, Estimated: >60 mL/min (ref >60)
Glucose, Bld: 117 mg/dL — ABNORMAL HIGH (ref 70–99)
Potassium: 4.3 mmol/L (ref 3.5–5.1)
Sodium: 135 mmol/L (ref 135–145)
Total Bilirubin: 0.7 mg/dL (ref 0.3–1.2)
Total Protein: 7.8 g/dL (ref 6.5–8.1)

## 2021-05-14 LAB — LACTATE DEHYDROGENASE: LDH: 219 U/L — ABNORMAL HIGH (ref 98–192)

## 2021-05-14 NOTE — Assessment & Plan Note (Addendum)
#  CLL-CD38 positive stage I [retroperitoneal lymph nodes]-White count 75,000; normal hemoglobin and platelets.  Asymptomatic. ? ?Patient continues to be asymptomatic clinically. Treatment will be recommended only if patient is symptomatic. Symptoms triggering treatment would be-profuse night sweats; weight loss; drop in his hemoglobin/platelets; frequent infections; significant lymphadenopathy etc. discussed with the patient that since his doubling time is 6 months-he might need treatment sooner.  But does not merit any treatment at this time. ? ?# I had a long discussion the patient regarding the multiple treatment options available for CLL at this time including- pills Ibrutinib/ infusion etc. however as patient is clinically asymptomatic would recommend continue monitoring every 3 months also.  Awaiting- Ig VH mutational status FISH panel today.  ? ?#Bladder outlet obstruction [Dr.Brandon] s/p HoLEP- Improvement of urinary symptoms.  ? ?#History of prostate cancer-under active surveillance [Dr.Brandon]-PROSTATIC INVOLVEMENT BY CHRONIC LYMPHOCYTIC LEUKEMIA (CLL)-however this does not merit any treatment at this time. ? ?# MS clinically STABLE-  ? ?# DISPOSITION: ?# follow up in 3 months- MD; labs- cbc/cmp;LDH-Dr.B ?

## 2021-05-14 NOTE — Progress Notes (Signed)
[facility] ?CONSULT NOTE ? ?Patient Care Team: ?[place] as PCP - General ? ?CHIEF COMPLAINTS/PURPOSE OF CONSULTATION: Ralph [name] ? ?Oncology History Overview Note  ?Chronic lymphocytic leukemia (Ralph [name]), positive for CD20, CD22, CD19 and  ?CD38,  ?see comment.   ?ANNOTATION COMMENT IMP Comment VC   ?Comment: (NOTE)  ?Expression of CD38 on >30% of the clonal B-cells is reported to be an  ?unfavorable prognostic factor in Ralph [name].   ? ? ?# Ralph [name] stage I- [lymphocytosis/retroperitoneal lymphadenopathy up to 2.5 cm-incidental CT protocol- ?2023] ? ?#GEN 2023-bilateral severe hydronephrosis-bladder outlet obstruction/status post catheter [Dr.Brandon] ? ?#History of prostate cancer under surveillance [[facility]] ?  ?Ralph [name] (chronic lymphocytic leukemia) ([place])  ?02/11/2021 Initial Diagnosis  ? Ralph [name] (chronic lymphocytic leukemia) ([place]) ?  ? ? ? ?HISTORY OF PRESENTING ILLNESS: Alone.  Ambulating independently. ? ?Ralph [name] 56 y.o.  male history of multiple sclerosis-not on active therapy; prostate cancer on surveillance; Ralph [name] currently on surveillance is here for a follow-up. ? ?Patient interim underwent HeLoP for bladder outlet obstruction with bilateral hydronephrosis.  Patient has noted significant improvement of his urination after the procedure. ? ?Denies any night sweats.  Denies any fevers or chills.  No recent infections. ? ?Review of Systems  ?Constitutional:  Negative for chills, diaphoresis, fever, malaise/fatigue and weight loss.  ?HENT:  Negative for nosebleeds and sore throat.   ?Eyes:  Negative for double vision.  ?Respiratory:  Negative for cough, hemoptysis, sputum production, shortness of breath and wheezing.   ?Cardiovascular:  Negative for chest pain, palpitations, orthopnea and leg swelling.  ?Gastrointestinal:  Negative for abdominal pain, blood in stool, constipation, diarrhea, heartburn, melena, nausea and vomiting.  ?Genitourinary:  Negative for dysuria, frequency and urgency.   ?Musculoskeletal:  Negative for back pain and joint pain.  ?Skin: Negative.  Negative for itching and rash.  ?Neurological:  Negative for dizziness, tingling, focal weakness, weakness and headaches.  ?Endo/Heme/Allergies:  Does not bruise/bleed easily.  ?Psychiatric/Behavioral:  Negative for depression. The patient is not nervous/anxious and does not have insomnia.    ? ?MEDICAL HISTORY:  ?Past Medical History:  ?Diagnosis Date  ? Ralph [name] (chronic lymphocytic leukemia) ([place])   ? Elevated PSA   ? MS (multiple sclerosis) ([place])   ? Pre-diabetes   ? Prostate cancer [facility]) 2013  ? ? ?SURGICAL HISTORY: ?Past Surgical History:  ?Procedure Laterality Date  ? HOLEP-LASER ENUCLEATION OF THE PROSTATE WITH MORCELLATION N/A 03/11/2021  ? Procedure: HOLEP-LASER ENUCLEATION OF THE PROSTATE WITH MORCELLATION;  Surgeon: Hollice Espy, MD;  Location: [facility] ORS;  Service: Urology;  Laterality: N/A;  ? KNEE SURGERY  2002  ? meniscus  ? ? ?SOCIAL HISTORY: ?Social History  ? ?Socioeconomic History  ? Marital status: Single  ?  Spouse name: Not on file  ? Number of children: 0  ? Years of education: College  ? Highest education level: Not on file  ?Occupational History  ? 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 Smoking status: Never  ? Smokeless tobacco: Never  ?Vaping Use  ? Vaping Use: Never used  ?Substance and Sexual Activity  ? Alcohol use: No  ?  Comment: Former alcohol consumption  ? Drug use: No  ? Sexual activity: Not Currently  ?Other Topics Concern  ? Not on file  ?Social History Narrative  ? Lives alone  ? Caffeine use: Diet coke/pepsi/Mt Dew  ? Right handed   ?   ? Denies history of smoking.  Denies any alcohol; lives in [place].   ? ?  Social Determinants of Health  ? ?Financial Resource Strain: Not on file  ?Food Insecurity: Not on file  ?Transportation Needs: Not on file  ?Physical Activity: Not on file  ?Stress: Not on file  ?Social Connections: Not on file  ?Intimate Partner Violence: Not on file  ? ? ?FAMILY  HISTORY: ?Family History  ?Problem Relation Age of Onset  ? Cancer Sister   ?     breast CA   ? ? ?ALLERGIES:  has No Known Allergies. ? ?MEDICATIONS:  ?No current outpatient medications on file.  ? ?No current facility-administered medications for this visit.  ? ? ?  ?. ? ?PHYSICAL EXAMINATION: ? ?Vitals:  ? 05/14/21 1501  ?BP: 120/79  ?Pulse: 78  ?Temp: 99.6 ?F (37.6 ?C)  ?SpO2: 100%  ? ?Filed Weights  ? 05/14/21 1501  ?Weight: 225 lb 9.6 oz (102.3 kg)  ? ?Foley catheter in place. ?Physical Exam ?Vitals and nursing note reviewed.  ?HENT:  ?   Head: Normocephalic and atraumatic.  ?   Mouth/Throat:  ?   Pharynx: Oropharynx is clear.  ?Eyes:  ?   Extraocular Movements: Extraocular movements intact.  ?   Pupils: Pupils are equal, round, and reactive to light.  ?Cardiovascular:  ?   Rate and Rhythm: Normal rate and regular rhythm.  ?Pulmonary:  ?   Comments: Decreased breath sounds bilaterally.  ?Abdominal:  ?   Palpations: Abdomen is soft.  ?Musculoskeletal:     ?   General: Normal range of motion.  ?   Cervical back: Normal range of motion.  ?Skin: ?   General: Skin is warm.  ?Neurological:  ?   General: No focal deficit present.  ?   Mental Status: He is alert and oriented to person, place, and time.  ?Psychiatric:     ?   Behavior: Behavior normal.     ?   Judgment: Judgment normal.  ? ? ?LABORATORY DATA:  ?I have reviewed the data as listed ?Lab Results  ?Component Value Date  ? WBC 75.0 (HH) 05/14/2021  ? HGB 13.0 05/14/2021  ? HCT 42.2 05/14/2021  ? MCV 83.4 05/14/2021  ? PLT 249 05/14/2021  ? ?Recent Labs  ?  01/27/21 ?1433 02/11/21 ?1457 05/14/21 ?1509  ?NA 140 141 135  ?K 3.9 4.1 4.3  ?CL 105 105 102  ?CO2 '25 25 26  '$ ?GLUCOSE 95 88 117*  ?BUN 22* 7 19  ?CREATININE 1.51* 1.19 1.23  ?CALCIUM 9.0 9.5 9.4  ?GFRNONAA 54* >60 >60  ?PROT  --  7.4 7.8  ?ALBUMIN 3.8 4.3 4.5  ?AST  --  22 38  ?ALT  --  22 58*  ?ALKPHOS  --  60 91  ?BILITOT  --  0.5 0.7  ? ? ?RADIOGRAPHIC STUDIES: ?I have personally reviewed the  radiological images as listed and agreed with the findings in the report. ?No results found. ? ?Ralph [name] (chronic lymphocytic leukemia) ([place]) ?#Ralph [name]-CD38 positive stage I [retroperitoneal lymph nodes]-White count 75,000; normal hemoglobin and platelets.  Asymptomatic. ? ?Patient continues to be asymptomatic clinically. Treatment will be recommended only if patient is symptomatic. Symptoms triggering treatment would be-profuse night sweats; weight loss; drop in his hemoglobin/platelets; frequent infections; significant lymphadenopathy etc. discussed with the patient that since his doubling time is 6 months-he might need treatment sooner.  But does not merit any treatment at this time. ? ?# I had a long discussion the patient regarding the multiple treatment options available for Ralph [name] at this time including- pills Ibrutinib/ infusion  etc. however as patient is clinically asymptomatic would recommend continue monitoring every 3 months also.  Awaiting- Ig VH mutational status FISH panel today.  ? ?#Bladder outlet obstruction [Dr.Brandon] s/p HoLEP- Improvement of urinary symptoms.  ? ?#History of prostate cancer-under active surveillance [Dr.Brandon]-PROSTATIC INVOLVEMENT BY CHRONIC LYMPHOCYTIC LEUKEMIA (Ralph [name])-however this does not merit any treatment at this time. ? ?# MS clinically STABLE-  ? ?# DISPOSITION: ?# follow up in 3 months- MD; labs- cbc/cmp;LDH-Dr.B ? ? ? ?All questions were answered. The patient knows to call the clinic with any problems, questions or concerns. ? ? ? ?  ? Cammie Sickle, MD ?05/14/2021 5:16 PM ? ? ? ? ? ?

## 2021-05-16 ENCOUNTER — Other Ambulatory Visit: Payer: Self-pay | Admitting: Family Medicine

## 2021-05-16 DIAGNOSIS — G35 Multiple sclerosis: Secondary | ICD-10-CM

## 2021-05-16 DIAGNOSIS — G35D Multiple sclerosis, unspecified: Secondary | ICD-10-CM

## 2021-05-17 LAB — FISH HES LEUKEMIA, 4Q12 REA

## 2021-05-21 LAB — IGVH SOMATIC HYPERMUTATION

## 2021-05-30 ENCOUNTER — Ambulatory Visit
Admission: RE | Admit: 2021-05-30 | Discharge: 2021-05-30 | Disposition: A | Discharge status: Home / Self Care | Payer: Federal, State, Local not specified - PPO | Source: Ambulatory Visit | Attending: Family Medicine | Admitting: Family Medicine

## 2021-05-30 DIAGNOSIS — G35 Multiple sclerosis: Secondary | ICD-10-CM | POA: Diagnosis present

## 2021-05-30 MED ORDER — GADOBUTROL 1 MMOL/ML IV SOLN
10.0000 mL | Freq: Once | INTRAVENOUS | Status: AC | PRN
  Administered 2021-05-30: 10 mL via INTRAVENOUS

## 2021-08-16 ENCOUNTER — Inpatient Hospital Stay: Payer: Federal, State, Local not specified - PPO | Attending: Internal Medicine

## 2021-08-16 ENCOUNTER — Encounter: Payer: Self-pay | Admitting: Medical Oncology

## 2021-08-16 ENCOUNTER — Inpatient Hospital Stay (HOSPITAL_BASED_OUTPATIENT_CLINIC_OR_DEPARTMENT_OTHER): Payer: Federal, State, Local not specified - PPO | Admitting: Medical Oncology

## 2021-08-16 ENCOUNTER — Inpatient Hospital Stay: Payer: Federal, State, Local not specified - PPO | Admitting: Internal Medicine

## 2021-08-16 ENCOUNTER — Inpatient Hospital Stay: Payer: Federal, State, Local not specified - PPO

## 2021-08-16 VITALS — BP 113/81 | HR 80 | Temp 98.7°F | Resp 20 | Wt 215.5 lb

## 2021-08-16 DIAGNOSIS — R634 Abnormal weight loss: Secondary | ICD-10-CM | POA: Diagnosis not present

## 2021-08-16 DIAGNOSIS — C9110 Chronic lymphocytic leukemia of B-cell type not having achieved remission: Secondary | ICD-10-CM

## 2021-08-16 DIAGNOSIS — Z8546 Personal history of malignant neoplasm of prostate: Secondary | ICD-10-CM | POA: Insufficient documentation

## 2021-08-16 DIAGNOSIS — G35 Multiple sclerosis: Secondary | ICD-10-CM | POA: Diagnosis not present

## 2021-08-16 LAB — CBC WITH DIFFERENTIAL/PLATELET
Abs Immature Granulocytes: 0.26 10*3/uL — ABNORMAL HIGH (ref 0.00–0.07)
Basophils Absolute: 0.1 10*3/uL (ref 0.0–0.1)
Basophils Relative: 0 %
Eosinophils Absolute: 0.8 10*3/uL — ABNORMAL HIGH (ref 0.0–0.5)
Eosinophils Relative: 1 %
HCT: 42.8 % (ref 39.0–52.0)
Hemoglobin: 13.5 g/dL (ref 13.0–17.0)
Immature Granulocytes: 0 %
Lymphocytes Relative: 81 %
Lymphs Abs: 75.2 10*3/uL — ABNORMAL HIGH (ref 0.7–4.0)
MCH: 26.1 pg (ref 26.0–34.0)
MCHC: 31.5 g/dL (ref 30.0–36.0)
MCV: 82.6 fL (ref 80.0–100.0)
Monocytes Absolute: 11.3 10*3/uL — ABNORMAL HIGH (ref 0.1–1.0)
Monocytes Relative: 12 %
Neutro Abs: 5.2 10*3/uL (ref 1.7–7.7)
Neutrophils Relative %: 6 %
Platelets: 182 10*3/uL (ref 150–400)
RBC: 5.18 MIL/uL (ref 4.22–5.81)
RDW: 19.9 % — ABNORMAL HIGH (ref 11.5–15.5)
Smear Review: NORMAL
WBC: 92.8 10*3/uL (ref 4.0–10.5)
nRBC: 0 % (ref 0.0–0.2)

## 2021-08-16 LAB — COMPREHENSIVE METABOLIC PANEL
ALT: 26 U/L (ref 0–44)
AST: 27 U/L (ref 15–41)
Albumin: 4.5 g/dL (ref 3.5–5.0)
Alkaline Phosphatase: 91 U/L (ref 38–126)
Anion gap: 12 (ref 5–15)
BUN: 19 mg/dL (ref 6–20)
CO2: 26 mmol/L (ref 22–32)
Calcium: 9.4 mg/dL (ref 8.9–10.3)
Chloride: 103 mmol/L (ref 98–111)
Creatinine, Ser: 1.17 mg/dL (ref 0.61–1.24)
GFR, Estimated: >60 mL/min (ref >60)
Glucose, Bld: 93 mg/dL (ref 70–99)
Potassium: 4.2 mmol/L (ref 3.5–5.1)
Sodium: 141 mmol/L (ref 135–145)
Total Bilirubin: 0.9 mg/dL (ref 0.3–1.2)
Total Protein: 7.5 g/dL (ref 6.5–8.1)

## 2021-08-16 LAB — LACTATE DEHYDROGENASE: LDH: 240 U/L — ABNORMAL HIGH (ref 98–192)

## 2021-08-16 NOTE — Progress Notes (Signed)
[facility] NOTE  Patient Care Team: [place] as PCP - General Rogue Bussing, Elisha Headland, MD as Consulting Physician (Oncology)  CHIEF COMPLAINTS/PURPOSE OF CONSULTATION: CLL  Oncology History Overview Note  Chronic lymphocytic leukemia (CLL), positive for CD20, CD22, CD19 and  CD38,  see comment.   ANNOTATION COMMENT IMP Comment VC   Comment: (NOTE)  Expression of CD38 on >30% of the clonal B-cells is reported to be an  unfavorable prognostic factor in CLL.     # CLL stage I- [lymphocytosis/retroperitoneal lymphadenopathy up to 2.5 cm-incidental CT protocol- 2023]  #GEN 2023-bilateral severe hydronephrosis-bladder outlet obstruction/status post catheter [Dr.Brandon]  #History of prostate cancer under surveillance [[facility]]   CLL (chronic lymphocytic leukemia) ([place])  02/11/2021 Initial Diagnosis   CLL (chronic lymphocytic leukemia) ([facility])      HISTORY OF PRESENTING ILLNESS: Alone.  Ambulating independently.  Ralph [name] 56 y.o.  male history of multiple sclerosis-not on active therapy; prostate cancer on surveillance; CLL currently on surveillance is here for a follow-up.  Patient reports that he is doing well. He feels well. Has lost 10 pounds in 3 months- he reports that he has been completing some diet adjustments which are likely contributing. Unsure about night sweats as he states that he does wake up sweaty sometimes but attributes it to it being summer. No recurrent infections, no known lymphadenopathy.   Review of Systems  Constitutional:  Negative for chills, diaphoresis, fever, malaise/fatigue and weight loss.  HENT:  Negative for nosebleeds and sore throat.   Eyes:  Negative for double vision.  Respiratory:  Negative for cough, hemoptysis, sputum production, shortness of breath and wheezing.   Cardiovascular:  Negative for chest pain, palpitations, orthopnea and leg swelling.  Gastrointestinal:  Negative for abdominal pain, blood in  stool, constipation, diarrhea, heartburn, melena, nausea and vomiting.  Genitourinary:  Negative for dysuria, frequency and urgency.  Musculoskeletal:  Negative for back pain and joint pain.  Skin: Negative.  Negative for itching and rash.  Neurological:  Negative for dizziness, tingling, focal weakness, weakness and headaches.  Endo/Heme/Allergies:  Does not bruise/bleed easily.  Psychiatric/Behavioral:  Negative for depression. The patient is not nervous/anxious and does not have insomnia.      MEDICAL HISTORY:  Past Medical History:  Diagnosis Date   CLL (chronic lymphocytic leukemia) ([place])    Elevated PSA    MS (multiple sclerosis) ([place])    Pre-diabetes    Prostate cancer ([place]) 2013    SURGICAL HISTORY: Past Surgical History:  Procedure Laterality Date   HOLEP-LASER ENUCLEATION OF THE PROSTATE WITH MORCELLATION N/A 03/11/2021   Procedure: HOLEP-LASER ENUCLEATION OF THE PROSTATE WITH MORCELLATION;  Surgeon: Hollice Espy, MD;  Location: [facility] ORS;  Service: Urology;  Laterality: N/A;   KNEE SURGERY  2002   meniscus    SOCIAL HISTORY: Social History   Socioeconomic History   Marital status: Single    Spouse name: Not on file   Number of children: 0   Years of education: College   Highest education level: Not on file  Occupational History   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  Denies any alcohol; lives in [place].    Social Determinants of Health   Financial Resource Strain: Not on file  Food Insecurity: Not on file  Transportation Needs: Not on file   Physical Activity: Not on file  Stress: Not on file  Social Connections: Not on file  Intimate Partner Violence: Not on file    FAMILY HISTORY: Family History  Problem Relation Age of Onset   Cancer Sister        breast CA     ALLERGIES:  has No Known Allergies.  MEDICATIONS:  No current outpatient medications on file.   No current facility-administered medications for this visit.      [name]  PHYSICAL EXAMINATION:  Vitals:   08/16/21 1421  BP: 113/81  Pulse: 80  Resp: 20  Temp: 98.7 F (37.1 C)  SpO2: 100%   Filed Weights   08/16/21 1421  Weight: 215 lb 8 oz (97.8 kg)   Foley catheter in place. Physical Exam Vitals and nursing note reviewed.  HENT:     Head: Normocephalic and atraumatic.     Mouth/Throat:     Pharynx: Oropharynx is clear.  Eyes:     Extraocular Movements: Extraocular movements intact.     Pupils: Pupils are equal, round, and reactive to light.  Cardiovascular:     Rate and Rhythm: Normal rate and regular rhythm.  Pulmonary:     Comments: Decreased breath sounds bilaterally.  Abdominal:     Palpations: Abdomen is soft.  Musculoskeletal:        General: Normal range of motion.     Cervical back: Normal range of motion.  Lymphadenopathy:     Head:     Right side of head: No submental, submandibular, tonsillar, preauricular, posterior auricular or occipital adenopathy.     Left side of head: No submental, submandibular, tonsillar, preauricular, posterior auricular or occipital adenopathy.     Cervical: No cervical adenopathy.     Right cervical: No superficial, deep or posterior cervical adenopathy.    Left cervical: No superficial, deep or posterior cervical adenopathy.     Upper Body:     Right upper body: Supraclavicular adenopathy present. No axillary or epitrochlear adenopathy.     Left upper body: No supraclavicular, axillary, pectoral or epitrochlear adenopathy.  Skin:    General: Skin is warm.  Neurological:     General: No  focal deficit present.     Mental Status: He is alert and oriented to person, place, and time.  Psychiatric:        Behavior: Behavior normal.        Judgment: Judgment normal.     LABORATORY DATA:  I have reviewed the data as listed Lab Results  Component Value Date   WBC 92.8 (HH) 08/16/2021   HGB 13.5 08/16/2021   HCT 42.8 08/16/2021   MCV 82.6 08/16/2021   PLT 182 08/16/2021   Recent Labs    02/11/21 1457 05/14/21 1509 08/16/21 1400  NA 141 135 141  K 4.1 4.3 4.2  CL 105 102 103  CO2 '25 26 26  '$ GLUCOSE 88 117* 93  BUN '7 19 19  '$ CREATININE 1.19 1.23 1.17  CALCIUM 9.5 9.4 9.4  GFRNONAA >60 >60 >60  PROT 7.4 7.8 7.5  ALBUMIN 4.3 4.5 4.5  AST 22 38 27  ALT 22 58* 26  ALKPHOS 60 91 91  BILITOT 0.5 0.7 0.9    RADIOGRAPHIC STUDIES: I have personally reviewed the radiological images as listed and agreed with the findings in the report. No results found.  Encounter Diagnoses  Name Primary?   CLL (chronic lymphocytic leukemia) ([facility]) Yes   Weight loss    Chronic in nature.  Counts are worsening. WBC from 75 to 92.8. Hgb actually improved from previous (13 to 13.5).  LDH climbed to 240 from 219.  Platelets lowered from 249-182 which is a fairly substantial drop however still within normal range.  His absolute lymphocyte count raised from 59.2-75.2 his absolute monocyte count raised from 10-11.3.  He is eosinophilic absolute count raised from point to 2.8. Questionably symptomatic. Will discuss with Dr. Rogue Bussing as to whether he would like to set up an appointment to discuss treatment options with patient or if he wishes to continue very close monitoring.  Unsure if related to diet adjustments or process. Will follow  PLAN: RTC 1 month MD, Labs, CT neck./ chest abdomen pelvis prior to the visit. Labs [facility] Uric acid.   All questions were answered. The patient knows to call the clinic with any problems, questions or concerns.       Ralph Closs,  PA-C 08/16/2021 4:18 PM

## 2021-08-19 ENCOUNTER — Other Ambulatory Visit: Payer: Self-pay

## 2021-08-19 DIAGNOSIS — C9110 Chronic lymphocytic leukemia of B-cell type not having achieved remission: Secondary | ICD-10-CM

## 2021-09-16 ENCOUNTER — Other Ambulatory Visit: Payer: Federal, State, Local not specified - PPO

## 2021-09-16 ENCOUNTER — Ambulatory Visit
Admission: RE | Admit: 2021-09-16 | Discharge: 2021-09-16 | Disposition: A | Discharge status: Home / Self Care | Payer: Federal, State, Local not specified - PPO | Source: Ambulatory Visit | Attending: Medical Oncology | Admitting: Medical Oncology

## 2021-09-16 DIAGNOSIS — C9110 Chronic lymphocytic leukemia of B-cell type not having achieved remission: Secondary | ICD-10-CM | POA: Diagnosis present

## 2021-09-16 MED ORDER — IOHEXOL 300 MG/ML  SOLN
100.0000 mL | Freq: Once | INTRAMUSCULAR | Status: AC | PRN
  Administered 2021-09-16: 100 mL via INTRAVENOUS

## 2021-09-18 ENCOUNTER — Other Ambulatory Visit: Payer: Self-pay

## 2021-09-18 DIAGNOSIS — C9110 Chronic lymphocytic leukemia of B-cell type not having achieved remission: Secondary | ICD-10-CM

## 2021-09-19 ENCOUNTER — Encounter: Payer: Self-pay | Admitting: Internal Medicine

## 2021-09-19 ENCOUNTER — Telehealth: Payer: Self-pay | Admitting: Urology

## 2021-09-19 ENCOUNTER — Inpatient Hospital Stay: Payer: Federal, State, Local not specified - PPO | Attending: Internal Medicine

## 2021-09-19 ENCOUNTER — Inpatient Hospital Stay (HOSPITAL_BASED_OUTPATIENT_CLINIC_OR_DEPARTMENT_OTHER): Payer: Federal, State, Local not specified - PPO | Admitting: Internal Medicine

## 2021-09-19 VITALS — BP 139/76 | HR 90 | Temp 99.9°F | Resp 18 | Ht 70.0 in | Wt 224.0 lb

## 2021-09-19 DIAGNOSIS — G35 Multiple sclerosis: Secondary | ICD-10-CM | POA: Diagnosis not present

## 2021-09-19 DIAGNOSIS — Z803 Family history of malignant neoplasm of breast: Secondary | ICD-10-CM | POA: Diagnosis not present

## 2021-09-19 DIAGNOSIS — Z8546 Personal history of malignant neoplasm of prostate: Secondary | ICD-10-CM | POA: Insufficient documentation

## 2021-09-19 DIAGNOSIS — C9110 Chronic lymphocytic leukemia of B-cell type not having achieved remission: Secondary | ICD-10-CM | POA: Insufficient documentation

## 2021-09-19 LAB — CBC WITH DIFFERENTIAL/PLATELET
Abs Immature Granulocytes: 0.29 10*3/uL — ABNORMAL HIGH (ref 0.00–0.07)
Basophils Absolute: 0.1 10*3/uL (ref 0.0–0.1)
Basophils Relative: 0 %
Eosinophils Absolute: 0.3 10*3/uL (ref 0.0–0.5)
Eosinophils Relative: 0 %
HCT: 39.8 % (ref 39.0–52.0)
Hemoglobin: 12.2 g/dL — ABNORMAL LOW (ref 13.0–17.0)
Immature Granulocytes: 0 %
Lymphocytes Relative: 82 %
Lymphs Abs: 76.4 10*3/uL — ABNORMAL HIGH (ref 0.7–4.0)
MCH: 26.4 pg (ref 26.0–34.0)
MCHC: 30.7 g/dL (ref 30.0–36.0)
MCV: 86.1 fL (ref 80.0–100.0)
Monocytes Absolute: 12.1 10*3/uL — ABNORMAL HIGH (ref 0.1–1.0)
Monocytes Relative: 13 %
Neutro Abs: 4.8 10*3/uL (ref 1.7–7.7)
Neutrophils Relative %: 5 %
Platelets: 192 10*3/uL (ref 150–400)
RBC: 4.62 MIL/uL (ref 4.22–5.81)
RDW: 19.3 % — ABNORMAL HIGH (ref 11.5–15.5)
Smear Review: NORMAL
WBC: 94 10*3/uL (ref 4.0–10.5)
nRBC: 0 % (ref 0.0–0.2)

## 2021-09-19 LAB — COMPREHENSIVE METABOLIC PANEL
ALT: 23 U/L (ref 0–44)
AST: 24 U/L (ref 15–41)
Albumin: 4.4 g/dL (ref 3.5–5.0)
Alkaline Phosphatase: 90 U/L (ref 38–126)
Anion gap: 6 (ref 5–15)
BUN: 21 mg/dL — ABNORMAL HIGH (ref 6–20)
CO2: 26 mmol/L (ref 22–32)
Calcium: 9.3 mg/dL (ref 8.9–10.3)
Chloride: 108 mmol/L (ref 98–111)
Creatinine, Ser: 1.13 mg/dL (ref 0.61–1.24)
GFR, Estimated: >60 mL/min (ref >60)
Glucose, Bld: 103 mg/dL — ABNORMAL HIGH (ref 70–99)
Potassium: 4.3 mmol/L (ref 3.5–5.1)
Sodium: 140 mmol/L (ref 135–145)
Total Bilirubin: 0.9 mg/dL (ref 0.3–1.2)
Total Protein: 7.5 g/dL (ref 6.5–8.1)

## 2021-09-19 LAB — URIC ACID: Uric Acid, Serum: 11 mg/dL — ABNORMAL HIGH (ref 3.7–8.6)

## 2021-09-19 LAB — LACTATE DEHYDROGENASE: LDH: 229 U/L — ABNORMAL HIGH (ref 98–192)

## 2021-09-19 NOTE — Telephone Encounter (Signed)
Notified of incidental bladder calcification on CT scan by Dr. Rogue Bussing.  Suspect this is a calcified prostate chip from previous HoLEP.  He is scheduled to see me in October, and like to also plan to scope him on this day in the office.  Please change visit type and let him know.  We can visually confirm my suspicion.  Hollice Espy, MD

## 2021-09-19 NOTE — Telephone Encounter (Signed)
error 

## 2021-09-19 NOTE — Telephone Encounter (Signed)
Left message for patient that appointment has been changed to cysto and to call if he has any questions.

## 2021-09-19 NOTE — Progress Notes (Unsigned)
[facility] NOTE  Patient Care Team: [place] as PCP - General Rogue Bussing, Elisha Headland, MD as Consulting Physician (Oncology)  CHIEF COMPLAINTS/PURPOSE OF CONSULTATION: CLL  Oncology History Overview Note  Chronic lymphocytic leukemia (CLL), positive for CD20, CD22, CD19 and  CD38,  see comment.   ANNOTATION COMMENT IMP Comment VC   Comment: (NOTE)  Expression of CD38 on >30% of the clonal B-cells is reported to be an  unfavorable prognostic factor in CLL.     # CLL stage I- [lymphocytosis/retroperitoneal lymphadenopathy up to 2.5 cm-incidental CT protocol- 2023]  #GEN 2023-bilateral severe hydronephrosis-bladder outlet obstruction/status post catheter [Dr.Brandon]  #History of prostate cancer under surveillance [[facility]]   CLL (chronic lymphocytic leukemia) ([place])  02/11/2021 Initial Diagnosis   CLL (chronic lymphocytic leukemia) (HCC)     HISTORY OF PRESENTING ILLNESS: Alone.  Ambulating independently.  Ralph [name] 56 y.o.  male history of multiple sclerosis-not on active therapy; prostate cancer on surveillance; CLL currently on surveillance is here for a follow-up.  Denies any night sweats.  Denies any fevers or chills.  No recent infections.  Review of Systems  Constitutional:  Negative for chills, diaphoresis, fever, malaise/fatigue and weight loss.  HENT:  Negative for nosebleeds and sore throat.   Eyes:  Negative for double vision.  Respiratory:  Negative for cough, hemoptysis, sputum production, shortness of breath and wheezing.   Cardiovascular:  Negative for chest pain, palpitations, orthopnea and leg swelling.  Gastrointestinal:  Negative for abdominal pain, blood in stool, constipation, diarrhea, heartburn, melena, nausea and vomiting.  Genitourinary:  Negative for dysuria, frequency and urgency.  Musculoskeletal:  Negative for back pain and joint pain.  Skin: Negative.  Negative for itching and rash.  Neurological:  Negative  for dizziness, tingling, focal weakness, weakness and headaches.  Endo/Heme/Allergies:  Does not bruise/bleed easily.  Psychiatric/Behavioral:  Negative for depression. The patient is not nervous/anxious and does not have insomnia.      MEDICAL HISTORY:  Past Medical History:  Diagnosis Date   CLL (chronic lymphocytic leukemia) ([place])    Elevated PSA    MS (multiple sclerosis) ([place])    Pre-diabetes    Prostate cancer ([place]) 2013    SURGICAL HISTORY: Past Surgical History:  Procedure Laterality Date   HOLEP-LASER ENUCLEATION OF THE PROSTATE WITH MORCELLATION N/A 03/11/2021   Procedure: HOLEP-LASER ENUCLEATION OF THE PROSTATE WITH MORCELLATION;  Surgeon: Hollice Espy, MD;  Location: [facility] ORS;  Service: Urology;  Laterality: N/A;   KNEE SURGERY  2002   meniscus    SOCIAL HISTORY: Social History   Socioeconomic History   Marital status: Single    Spouse name: Not on file   Number of children: 0   Years of education: College   Highest education level: Not on file  Occupational History   Occupation: Actor Visual merchandiser)  Tobacco Use   Smoking status: Never   Smokeless tobacco: Never  Vaping Use   Vaping Use: Never used  Substance and Sexual Activity   Alcohol use: No    Comment: Former alcohol consumption   Drug use: No   Sexual activity: Not Currently  Other Topics Concern   Not on file  Social History Narrative   Lives alone   Caffeine use: Diet coke/pepsi/Mt Dew   Right handed       Denies history of smoking.  Denies any alcohol; lives in [place].    Social Determinants of Health   Financial Resource Strain: Not on file  Food Insecurity:  Not on file  Transportation Needs: Not on file  Physical Activity: Not on file  Stress: Not on file  Social Connections: Not on file  Intimate Partner Violence: Not on file    FAMILY HISTORY: Family History  Problem Relation Age of Onset   Cancer Sister        breast CA     ALLERGIES:  has No Known  Allergies.  MEDICATIONS:  No current outpatient medications on file.   No current facility-administered medications for this visit.      [name]  PHYSICAL EXAMINATION:  Vitals:   09/19/21 1119  BP: 139/76  Pulse: 90  Resp: 18  Temp: 99.9 F (37.7 C)   Filed Weights   09/19/21 1119  Weight: 224 lb (101.6 kg)   Foley catheter in place. Physical Exam Vitals and nursing note reviewed.  HENT:     Head: Normocephalic and atraumatic.     Mouth/Throat:     Pharynx: Oropharynx is clear.  Eyes:     Extraocular Movements: Extraocular movements intact.     Pupils: Pupils are equal, round, and reactive to light.  Cardiovascular:     Rate and Rhythm: Normal rate and regular rhythm.  Pulmonary:     Comments: Decreased breath sounds bilaterally.  Abdominal:     Palpations: Abdomen is soft.  Musculoskeletal:        General: Normal range of motion.     Cervical back: Normal range of motion.  Skin:    General: Skin is warm.  Neurological:     General: No focal deficit present.     Mental Status: He is alert and oriented to person, place, and time.  Psychiatric:        Behavior: Behavior normal.        Judgment: Judgment normal.     LABORATORY DATA:  I have reviewed the data as listed Lab Results  Component Value Date   WBC 94.0 (HH) 09/19/2021   HGB 12.2 (L) 09/19/2021   HCT 39.8 09/19/2021   MCV 86.1 09/19/2021   PLT 192 09/19/2021   Recent Labs    05/14/21 1509 08/16/21 1400 09/19/21 1044  NA 135 141 140  K 4.3 4.2 4.3  CL 102 103 108  CO2 '26 26 26  '$ GLUCOSE 117* 93 103*  BUN 19 19 21*  CREATININE 1.23 1.17 1.13  CALCIUM 9.4 9.4 9.3  GFRNONAA >60 >60 >60  PROT 7.8 7.5 7.5  ALBUMIN 4.5 4.5 4.4  AST 38 27 24  ALT 58* 26 23  ALKPHOS 91 91 90  BILITOT 0.7 0.9 0.9    RADIOGRAPHIC STUDIES: I have personally reviewed the radiological images as listed and agreed with the findings in the report. CT SOFT TISSUE NECK W CONTRAST  Result Date: 09/17/2021 CLINICAL  DATA:  CLL EXAM: CT NECK WITH CONTRAST TECHNIQUE: Multidetector CT imaging of the neck was performed using the standard protocol following the bolus administration of intravenous contrast. RADIATION DOSE REDUCTION: This exam was performed according to the departmental dose-optimization program which includes automated exposure control, adjustment of the mA and/or kV according to patient size and/or use of iterative reconstruction technique. CONTRAST:  146m OMNIPAQUE IOHEXOL 300 MG/ML  SOLN COMPARISON:  None Available. FINDINGS: Pharynx and larynx: Mild prominence of palatine and lingual tonsils. Vocal cords opposed at the time of imaging. No mass. Salivary glands: Parotid and submandibular glands are unremarkable. Thyroid: Normal. Lymph nodes: Nonenlarged nodes are present throughout. Vascular: Major neck vessels are patent. Limited intracranial: No  abnormal enhancement. Visualized orbits: Unremarkable. Mastoids and visualized paranasal sinuses: No significant opacification. Skeleton: Degenerative changes of the cervical spine. 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 A small sclerotic focus at T2 probably reflects a bone island. Upper chest: Dictated separately Other: None. IMPRESSION: Numerous nonenlarged to top-normal lymph nodes. No prior study available for comparison. Electronically Signed   By: Macy Mis M.D.   On: 09/17/2021 09:21   CT CHEST ABDOMEN PELVIS W CONTRAST  Result Date: 09/17/2021 CLINICAL DATA:  CLL.  Restaging.  * Tracking Code: BO * EXAM: CT CHEST, ABDOMEN, AND PELVIS WITH CONTRAST TECHNIQUE: Multidetector CT imaging of the chest, abdomen and pelvis was performed following the standard protocol during bolus administration of intravenous contrast. RADIATION DOSE REDUCTION: This exam was performed according to the departmental dose-optimization program which includes automated exposure control, adjustment of the mA and/or kV according to patient size and/or use of iterative reconstruction  technique. CONTRAST:  113m OMNIPAQUE IOHEXOL 300 MG/ML  SOLN COMPARISON:  CT stone study 01/21/2021 FINDINGS: CT CHEST FINDINGS Cardiovascular: The heart size is normal. No substantial pericardial effusion. No thoracic aortic aneurysm. No substantial atherosclerosis of the thoracic aorta. Mediastinum/Nodes: 14 mm short axis retroesophageal node visible on image 28/series 4. No other mediastinal lymphadenopathy. There is no hilar lymphadenopathy. The esophagus has normal imaging features. Mild lymphadenopathy noted in both axillary regions including index left axillary node measuring 14 mm short axis on image 15/4. Lungs/Pleura: The the no suspicious pulmonary nodule or mass. No focal airspace consolidation. No pleural effusion. Musculoskeletal: No worrisome lytic or sclerotic osseous abnormality. CT ABDOMEN PELVIS FINDINGS Hepatobiliary: No suspicious focal abnormality within the liver parenchyma. Liver measures 20.4 cm craniocaudal length, mildly enlarged. There is no evidence for gallstones, gallbladder wall thickening, or pericholecystic fluid. No intrahepatic or extrahepatic biliary dilation. Pancreas: No focal mass lesion. No dilatation of the main duct. No intraparenchymal cyst. No peripancreatic edema. Spleen: Spleen is enlarged (volume estimated at 1089 cc). No focal abnormality within the splenic parenchyma. Adrenals/Urinary Tract: No adrenal nodule or mass. Right kidney unremarkable. 3 mm nonobstructing stone noted lower pole left kidney. No evidence for hydroureter. 2.1 x 0.9 cm stone or calcified mucosal lesion is seen posterior left bladder wall (image 110/series 4). This is in the region of the left UVJ. Stomach/Bowel: Stomach is unremarkable. No gastric wall thickening. No evidence of outlet obstruction. Duodenum is normally positioned as is the ligament of Treitz. No small bowel wall thickening. No small bowel dilatation. The terminal ileum is normal. The appendix is normal. No gross colonic mass.  No colonic wall thickening. Diverticuli are seen scattered along the entire length of the colon without CT findings of diverticulitis. Vascular/Lymphatic: No abdominal aortic aneurysm. No gastrohepatic or hepatoduodenal ligament lymphadenopathy. Increased number of small lymph nodes are seen in the root of the small bowel mesentery. Left central mesenteric node measured previously at 12 mm short axis is 8 mm short axis today on image 81/4. Small para-aortic lymph nodes evident as well. Lymphadenopathy along the pelvic sidewall bilaterally is similar to prior. 14 mm short axis left pelvic sidewall lymph node on 108/4 was 13 mm short axis previously (remeasured). 13 mm short axis right pelvic sidewall lymph node on 107/4 was 13 mm short axis previously (remeasured). Mild groin lymphadenopathy noted bilaterally. Index 12 mm left groin lymph node on 113/4 was 12 mm previously (remeasured). Reproductive: Prostate gland is markedly enlarged. Other: No intraperitoneal free fluid. Musculoskeletal: Small left groin hernia contains only fat. No worrisome lytic  or sclerotic osseous abnormality. 2 cm mixed lucent and sclerotic lesion in the roof of the left acetabulum is probably degenerative. Small sclerotic focus in the L5 vertebral body suggests a bone island. IMPRESSION: 1. Mild lymphadenopathy identified in both axillary regions, retroesophageal space, retroperitoneum, bilateral pelvic sidewalls in both groin regions. Imaging of the chest was not included on the prior study but lymphadenopathy in the abdomen and pelvis is not substantially changed in the interval. 2. Hepatosplenomegaly. 3. 2.1 x 0.9 cm stone or calcified mucosal lesion posterior left bladder wall in the region of the left UVJ. Urology consultation suggested. 4. 3 mm nonobstructing stone lower pole left kidney. 5. Small left groin hernia contains only fat. 6. Colonic diverticulosis without diverticulitis. 7. Marked prostatomegaly. 8. 2 cm mixed lucent and  sclerotic lesion in the roof of the left acetabulum is probably degenerative. Attention on follow-up recommended. Electronically Signed   By: Misty Stanley M.D.   On: 09/17/2021 05:47    CLL (chronic lymphocytic leukemia) (HCC) #CLL-CD38 positive stage III [retroperitoneal lymph nodes]-Asymptomatic. IGVH- UNMUTATED; ? FISH trisomy 12; re-ordered FISH panel. SEP 2023- CT neck-bilateral upper centimeters/lymph nodes noted; CT CAP-Mild lymphadenopathy identified in both axillary regions, retroesophageal space, retroperitoneum, bilateral pelvic sidewalls in both groin regions;  Hepatosplenomegaly.  Patient continues to be asymptomatic clinically. Treatment will be recommended only if patient is symptomatic. Symptoms triggering treatment would be-profuse night sweats; weight loss; drop in his hemoglobin/platelets; frequent infections; significant lymphadenopathy etc. discussed with the patient that since his doubling time is 6 months-he might need treatment sooner.  But does not merit any treatment at this time.  Discussed the treatment options include antibody therapy/fusions Vs- BTK inhibitors/pills.    #History of urinary retention /prostatomegaly /prostate cancer  [Dr.Brandon]incidental-  2.1 x 0.9 cm stone or calcified mucosal lesion posterior left bladder wall in the region of the left UVJ.  Discussed with Dr.Brandon.    # MS clinically STABLE-  # DISPOSITION: # follow up in 4 months- MD; labs- cbc/cmp;LDH-Dr.B    All questions were answered. The patient knows to call the clinic with any problems, questions or concerns.       Cammie Sickle, MD 09/20/2021 5:56 AM

## 2021-09-19 NOTE — Telephone Encounter (Signed)
Patient called back and he does have some questions regarding appt.

## 2021-09-19 NOTE — Assessment & Plan Note (Signed)
#  CLL-CD38 positive stage III [retroperitoneal lymph nodes]-Asymptomatic. IGVH- UNMUTATED; ? FISH trisomy 12; re-ordered FISH panel. SEP 2023- CT neck-bilateral upper centimeters/lymph nodes noted; CT CAP-Mild lymphadenopathy identified in both axillary regions, retroesophageal space, retroperitoneum, bilateral pelvic sidewalls in both groin regions;  Hepatosplenomegaly.  Patient continues to be asymptomatic clinically. Treatment will be recommended only if patient is symptomatic. Symptoms triggering treatment would be-profuse night sweats; weight loss; drop in his hemoglobin/platelets; frequent infections; significant lymphadenopathy etc. discussed with the patient that since his doubling time is 6 months-he might need treatment sooner.  But does not merit any treatment at this time.  Discussed the treatment options include antibody therapy/fusions Vs- BTK inhibitors/pills.    #History of urinary retention /prostatomegaly /prostate cancer  [Dr.Brandon]incidental-  2.1 x 0.9 cm stone or calcified mucosal lesion posterior left bladder wall in the region of the left UVJ.  Discussed with Dr.Brandon.    # MS clinically STABLE-  # DISPOSITION: # follow up in 4 months- MD; labs- cbc/cmp;LDH-Dr.B

## 2021-09-19 NOTE — Progress Notes (Unsigned)
Patient denies new problems/concerns today.   °

## 2021-09-25 ENCOUNTER — Inpatient Hospital Stay (HOSPITAL_BASED_OUTPATIENT_CLINIC_OR_DEPARTMENT_OTHER): Payer: Federal, State, Local not specified - PPO | Admitting: Medical Oncology

## 2021-09-25 ENCOUNTER — Encounter: Payer: Self-pay | Admitting: Medical Oncology

## 2021-09-25 DIAGNOSIS — E790 Hyperuricemia without signs of inflammatory arthritis and tophaceous disease: Secondary | ICD-10-CM

## 2021-09-25 DIAGNOSIS — C9110 Chronic lymphocytic leukemia of B-cell type not having achieved remission: Secondary | ICD-10-CM

## 2021-09-25 NOTE — Progress Notes (Signed)
Virtual Visit Progress Note  [name], Ralph [name] are scheduled for a virtual visit with your provider today.    Just as we do with appointments in the office, we must obtain your consent to participate.  Your consent will be active for this visit and any virtual visit you may have with one of our providers in the next 365 days.    If you have a MyChart account, I can also send a copy of this consent to you electronically.  All virtual visits are billed to your insurance company just like a traditional visit in the office.  As this is a virtual visit, video technology does not allow for your provider to perform a traditional examination.  This may limit your provider's ability to fully assess your condition.  If your provider identifies any concerns that need to be evaluated in person or the need to arrange testing such as labs, EKG, etc, we will make arrangements to do so.    Although advances in technology are sophisticated, we cannot ensure that it will always work on either your end or our end.  If the connection with a video visit is poor, we may have to switch to a telephone visit.  With either a video or telephone visit, we are not always able to ensure that we have a secure connection.   I need to obtain your verbal consent now.   Are you willing to proceed with your visit today?   Ralph [name] has provided verbal consent on 09/25/2021 for a virtual visit (video or telephone).   Ralph Closs, PA-C 09/25/2021  2:39 PM    I connected with Ralph [name] on 09/25/21 at  2:45 PM EDT by video enabled telemedicine visit and verified that I am speaking with the correct person using two identifiers.   I discussed the limitations, risks, security and privacy concerns of performing an evaluation and management service by telemedicine and the availability of in-person appointments. I also discussed with the patient that there may be a patient responsible charge related to this service. The  patient expressed understanding and agreed to proceed.   Other persons participating in the visit and their role in the encounter: None  Patient's location: At work  Provider's location: Facilities manager Complaint: Elevated uric acid level.     Patient Care Team: [place] as PCP - General Cammie Sickle, MD as Consulting Physician (Oncology)   Name of the patient: Ralph [name]  [id number]  01-Mar-1965   Date of visit: 09/25/21  History of Presenting Illness-   Elevated uric acid level: Per concern of nephrology he was evaluated for an elevated uric acid level ON recent labs his uric acid level was 11. He denies history of gout or family history of gout. No joint swelling or pain. NO significant ETOH use.   Review of systems- ROS   No Known Allergies  Past Medical History:  Diagnosis Date   CLL (chronic lymphocytic leukemia) (HCC)    Elevated PSA    MS (multiple sclerosis) ([place])    Pre-diabetes    Prostate cancer ([place]) 2013    Past Surgical History:  Procedure Laterality Date   HOLEP-LASER ENUCLEATION OF THE PROSTATE WITH MORCELLATION N/A 03/11/2021   Procedure: HOLEP-LASER ENUCLEATION OF THE PROSTATE WITH MORCELLATION;  Surgeon: Hollice Espy, MD;  Location: [facility] ORS;  Service: Urology;  Laterality: N/A;   KNEE SURGERY  2002   meniscus    Social History  Socioeconomic History   Marital status: Single    Spouse name: Not on file   Number of children: 0   Years of education: College   Highest education level: Not on file  Occupational History   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  Denies any alcohol; lives in [place].    Social Determinants of Health   Financial Resource Strain: Not on file  Food Insecurity: Not on file  Transportation Needs: Not on file  Physical Activity: Not on file  Stress: Not on file  Social Connections: Not on file  Intimate Partner Violence: Not on file    Immunization History  Administered Date(s) Administered   Influenza Inj Mdck Quad Pf 10/29/2016   Influenza,inj,Quad PF,6+ Mos 01/22/2021   Influenza-Unspecified 10/10/2013   Tdap 07/16/2021   Zoster Recombinat (Shingrix) 01/29/2017, 06/30/2017    Family History  Problem Relation Age of Onset   Cancer Sister        breast CA     No current outpatient medications on file.  Physical exam: Exam limited due to telemedicine Physical Exam    Assessment and plan- Patient is a 56 y.o. male    Visit Diagnosis Encounter Diagnoses  Name Primary?   CLL (chronic lymphocytic leukemia) (HCC) Yes   Elevated uric acid in blood    CLL: Per Dr. B his CLL not likely to contribute to elevated uric acid level. Continue chronic care management with Dr. Jacinto Reap.   Elevated Uric Acid Level: Newly discovered. Discussed risks with patient. Discussed low purine diet in detail. At this time he elects dietary low purine trial x 1 month to see if he can naturally lower his uric acid level to avoid allopurinol and colchicine which may cause some decreased kidney function given his history. RTC 1 month labs and video visit follow up.   Patient expressed understanding and was in agreement with this plan. He also understands that He can call clinic at any time with any questions, concerns, or complaints.   I discussed the assessment and treatment plan with the patient. The patient was provided an opportunity to ask questions and all were answered. The patient agreed with the plan and demonstrated an understanding of the instructions.   The patient was advised  to call back or seek an in-person evaluation if the symptoms worsen or if the condition fails to improve as anticipated.   I spent 15 minutes face-to-face video visit time dedicated to the care of this patient on the date of this encounter to include pre-visit review of recent labs, low purine diet, discussion of his diet, answering any questions, face-to-face time with the patient, and post visit ordering of testing/documentation.    Thank you for allowing me to participate in the care of this very pleasant patient.   [facility]  CC:

## 2021-10-28 ENCOUNTER — Other Ambulatory Visit: Payer: Federal, State, Local not specified - PPO

## 2021-10-28 DIAGNOSIS — C61 Malignant neoplasm of prostate: Secondary | ICD-10-CM

## 2021-10-29 LAB — PSA: Prostate Specific Ag, Serum: 3.7 ng/mL (ref 0.0–4.0)

## 2021-10-30 ENCOUNTER — Encounter: Payer: Self-pay | Admitting: Urology

## 2021-10-30 ENCOUNTER — Ambulatory Visit: Payer: Federal, State, Local not specified - PPO | Admitting: Urology

## 2021-10-30 VITALS — BP 125/77 | HR 72 | Ht 70.0 in | Wt 225.4 lb

## 2021-10-30 DIAGNOSIS — N3000 Acute cystitis without hematuria: Secondary | ICD-10-CM | POA: Diagnosis not present

## 2021-10-30 DIAGNOSIS — N401 Enlarged prostate with lower urinary tract symptoms: Secondary | ICD-10-CM

## 2021-10-30 DIAGNOSIS — Z87448 Personal history of other diseases of urinary system: Secondary | ICD-10-CM | POA: Diagnosis not present

## 2021-10-30 DIAGNOSIS — C61 Malignant neoplasm of prostate: Secondary | ICD-10-CM

## 2021-10-30 DIAGNOSIS — R338 Other retention of urine: Secondary | ICD-10-CM | POA: Diagnosis not present

## 2021-10-30 DIAGNOSIS — Z8546 Personal history of malignant neoplasm of prostate: Secondary | ICD-10-CM | POA: Diagnosis not present

## 2021-10-30 LAB — MICROSCOPIC EXAMINATION: WBC, UA: >30 /hpf — AB (ref 0–5)

## 2021-10-30 LAB — URINALYSIS, COMPLETE
Bilirubin, UA: NEGATIVE
Glucose, UA: NEGATIVE
Ketones, UA: NEGATIVE
Nitrite, UA: POSITIVE — AB
Specific Gravity, UA: 1.02 (ref 1.005–1.030)
Urobilinogen, Ur: 0.2 mg/dL (ref 0.2–1.0)
pH, UA: 6.5 (ref 5.0–7.5)

## 2021-10-30 MED ORDER — SULFAMETHOXAZOLE-TRIMETHOPRIM 800-160 MG PO TABS: 1.0000 | ORAL_TABLET | Freq: Two times a day (BID) | ORAL | 0 refills | Status: DC

## 2021-10-30 NOTE — Progress Notes (Signed)
10/30/2021 9:45 AM   Ralph [name] [place] Jun 03, 1965 [id number]  Referring provider: [facility] [address] [place],  [place] [address]  Chief Complaint  Patient presents with   Cysto    HPI: 56 year old male with a personal history of remotely diagnosed low risk prostate cancer on active surveillance as well as prostamegaly BPH with urinary retention status post HoLEP who returns today for follow-up.  He underwent HoLEP on 03/2021 after presenting with massive urinary retention with bilateral hydronephrosis and renal failure.  The procedure was uncomplicated.  Postoperatively, he was doing well.  No prostate cancer was identified in the specimen although he did have CLL noted and his surgical pathology is being followed by medical oncology.  He underwent a CT chest abdomen pelvis on 09/17/2021 ordered by oncology for the purpose of surveillance was found to have a calcification on his left bladder neck.  He was originally scheduled for cystoscopy today but his urinalysis was frankly positive.  He does report that he is voiding actually quite well.  He denies any dysuria or gross hematuria but did have some left lower quadrant pain last week and wonders if this may be related.  No fevers or chills.   PMH: Past Medical History:  Diagnosis Date   CLL (chronic lymphocytic leukemia) ([place])    Elevated PSA    MS (multiple sclerosis) ([place])    Pre-diabetes    Prostate cancer ([place]) 2013    Surgical History: Past Surgical History:  Procedure Laterality Date   HOLEP-LASER ENUCLEATION OF THE PROSTATE WITH MORCELLATION N/A 03/11/2021   Procedure: HOLEP-LASER ENUCLEATION OF THE PROSTATE WITH MORCELLATION;  Surgeon: Hollice Espy, MD;  Location: [facility] ORS;  Service: Urology;  Laterality: N/A;   KNEE SURGERY  2002   meniscus    Home Medications:  Allergies as of 10/30/2021   No Known Allergies      Medication List        Accurate as of October 30, 2021  9:45 AM. If you  have any questions, ask your nurse or doctor.          sulfamethoxazole-trimethoprim 800-160 MG tablet Commonly known as: BACTRIM DS Take 1 tablet by mouth every 12 (twelve) hours. Started by: Hollice Espy, MD        Allergies: No Known Allergies  Family History: Family History  Problem Relation Age of Onset   Cancer Sister        breast CA     Social History:  reports that he has never smoked. He has never used smokeless tobacco. He reports that he does not drink alcohol and does not use drugs.   Physical Exam: BP 125/77   Pulse 72   Ht '5\' 10"'$  (1.778 m)   Wt 225 lb 6.4 oz (102.2 kg)   BMI 32.34 kg/m   Constitutional:  Alert and oriented, No acute distress. HEENT: [place] AT, moist mucus membranes.  Trachea midline, no masses. Cardiovascular: No clubbing, cyanosis, or edema. Neurologic: Grossly intact, no focal deficits, moving all 4 extremities. Psychiatric: Normal mood and affect.  Laboratory Data: Lab Results  Component Value Date   WBC 94.0 (HH) 09/19/2021   HGB 12.2 (L) 09/19/2021   HCT 39.8 09/19/2021   MCV 86.1 09/19/2021   PLT 192 09/19/2021    Lab Results  Component Value Date   CREATININE 1.13 09/19/2021    Lab Results  Component Value Date   HGBA1C 6.3 (H) 01/22/2021    Urinalysis UA today with nitrate positive,  many WBCs RBCs   Pertinent Imaging: IMPRESSION: 1. Mild lymphadenopathy identified in both axillary regions, retroesophageal space, retroperitoneum, bilateral pelvic sidewalls in both groin regions. Imaging of the chest was not included on the prior study but lymphadenopathy in the abdomen and pelvis is not substantially changed in the interval. 2. Hepatosplenomegaly. 3. 2.1 x 0.9 cm stone or calcified mucosal lesion posterior left bladder wall in the region of the left UVJ. Urology consultation suggested. 4. 3 mm nonobstructing stone lower pole left kidney. 5. Small left groin hernia contains only fat. 6. Colonic  diverticulosis without diverticulitis. 7. Marked prostatomegaly. 8. 2 cm mixed lucent and sclerotic lesion in the roof of the left acetabulum is probably degenerative. Attention on follow-up recommended.  The above CT scan was personally reviewed today, agree with radiologic interpretation.  2 cm calcified lesion at the left UVJ likely retained prostate specimen which is now calcified.  Assessment & Plan:     1. BPH with urinary obstruction Status post HoLEP with excellent symptom control  2. Prostate cancer [facility]) Remote history of Gleason 3+3 prostate cancer on active surveillance, most recent PSA 3.7 which is his new baseline, previously 9.86  3. Acute cystitis without hematuria Possibly related to retained prostate chip  We will go ahead and treat with antibiotics, urine culture today and reschedule cystoscopy for 1 to 2 weeks.  If this in fact is retained chip, will likely need this foreign body removed and is contributing factor to bacterial colonization.  Recommend office cystoscopy to rule out malignancy as well, less likely. - CULTURE, URINE COMPREHENSIVE    Return for reschedule cysto in 1-2 weeks.  Hollice Espy, MD  [facility] [address], [place], [place] [address] [phone]

## 2021-11-03 LAB — CULTURE, URINE COMPREHENSIVE

## 2021-11-06 ENCOUNTER — Other Ambulatory Visit: Payer: Self-pay

## 2021-11-06 DIAGNOSIS — N138 Other obstructive and reflux uropathy: Secondary | ICD-10-CM

## 2021-11-08 ENCOUNTER — Ambulatory Visit: Payer: Federal, State, Local not specified - PPO | Admitting: Urology

## 2021-11-08 ENCOUNTER — Encounter: Payer: Self-pay | Admitting: Urology

## 2021-11-08 ENCOUNTER — Other Ambulatory Visit: Payer: Self-pay | Admitting: Urology

## 2021-11-08 ENCOUNTER — Other Ambulatory Visit
Admission: RE | Admit: 2021-11-08 | Discharge: 2021-11-08 | Disposition: A | Discharge status: Home / Self Care | Payer: Federal, State, Local not specified - PPO | Attending: Urology | Admitting: Urology

## 2021-11-08 VITALS — BP 126/77 | HR 65 | Ht 71.0 in | Wt 217.4 lb

## 2021-11-08 DIAGNOSIS — N138 Other obstructive and reflux uropathy: Secondary | ICD-10-CM | POA: Insufficient documentation

## 2021-11-08 DIAGNOSIS — T191XXD Foreign body in bladder, subsequent encounter: Secondary | ICD-10-CM

## 2021-11-08 DIAGNOSIS — N429 Disorder of prostate, unspecified: Secondary | ICD-10-CM

## 2021-11-08 DIAGNOSIS — N401 Enlarged prostate with lower urinary tract symptoms: Secondary | ICD-10-CM | POA: Insufficient documentation

## 2021-11-08 DIAGNOSIS — N3080 Other cystitis without hematuria: Secondary | ICD-10-CM

## 2021-11-08 DIAGNOSIS — Z87440 Personal history of urinary (tract) infections: Secondary | ICD-10-CM

## 2021-11-08 LAB — URINALYSIS, COMPLETE (UACMP) WITH MICROSCOPIC
Bilirubin Urine: NEGATIVE
Glucose, UA: NEGATIVE mg/dL
Hgb urine dipstick: NEGATIVE
Ketones, ur: NEGATIVE mg/dL
Leukocytes,Ua: NEGATIVE
Nitrite: NEGATIVE
Protein, ur: NEGATIVE mg/dL
Specific Gravity, Urine: 1.02 (ref 1.005–1.030)
pH: 5.5 (ref 5.0–8.0)

## 2021-11-08 NOTE — Progress Notes (Signed)
Surgical Physician Order Form [facility]  * Scheduling expectation : Next Available  *Length of Case:   *Clearance needed: no  *Anticoagulation Instructions: Hold all anticoagulants  *Aspirin Instructions: Ok to continue Aspirin  *Post-op visit Date/Instructions:  Foley removal in 2 days, IPSS/PVR in 6 weeks with MD  *Diagnosis: Bladder foreign body, irregular prostatic fossa  *Procedure: Removal of bladder foreign body, TURP    Additional orders: N/A  -Admit type: OUTpatient  -Anesthesia: General  -VTE Prophylaxis Standing Order SCD's       Other:   -Standing Lab Orders Per Anesthesia    Lab other: UA&Urine Culture  -Standing Test orders EKG/Chest x-ray per Anesthesia       Test other:   - Medications:  Ancef 2gm IV  -Other orders:  N/A

## 2021-11-08 NOTE — Progress Notes (Signed)
   11/08/21  CC:  Chief Complaint  Patient presents with   Cysto    HPI: 56 year old male who presents today for evaluation of bladder calcification/foreign body status post holep  He had been scheduled for cystoscopy at last appointment but his urinalysis was frankly positive.  He was having some mild intermittent dysuria which with antibiotics has resolved.  His last dose was Wednesday.  Today, he is asymptomatic.  Please see previous notes for details.  NED. A&Ox3.   No respiratory distress   Abd soft, NT, ND Normal phallus with bilateral descended testicles  Cystoscopy Procedure Note  Patient identification was confirmed, informed consent was obtained, and patient was prepped using Betadine solution.  Lidocaine jelly was administered per urethral meatus.     Pre-Procedure: - Inspection reveals a normal caliber ureteral meatus.  Procedure: The flexible cystoscope was introduced without difficulty - No urethral strictures/lesions are present. -Irregular prostate with abnormal fossa, somewhat to difficult to navigate but ultimately able to find the true lumen into the bladder which was anterior.  There is a hanging fibrinous [place] a calcified material at the left bladder neck consistent with necrotic tissue.  Remainder of the bladder was unremarkable although somewhat obscured by prostatic bleeding with manipulation and cloudiness  Post-Procedure: - Patient tolerated the procedure well  Assessment/ Plan:  1. BPH with urinary obstruction Status post holep with improved urinary symptoms although evidence of retained foreign body as below  This likely contributing to recent infection.  In addition to this, he has a marked irregular prostatic fossa and would likely benefit from additional resection here as we will facilitate urinary flow dynamics as well as ability to place a catheter as needed in the future.  I recommended proceeding to the operating room for cystoscopy,  evacuation of bladder foreign body (necrotic prostate material ) as well as TURP as outlined above.  Risk including risk of bleeding, infection, demonstrating structures, need for further procedures, ejaculatory dysfunction, as well as need for postoperative Foley catheter discussed.  All questions answered.  2. Foreign body in bladder, subsequent encounter As above  3. History of UTI As above    Hollice Espy, MD

## 2021-11-22 ENCOUNTER — Telehealth: Payer: Self-pay

## 2021-11-22 NOTE — Telephone Encounter (Signed)
I spoke with Mr. Licciardi. We have discussed possible surgery dates and Monday December 11th, 2023 was agreed upon by all parties. Patient given information about surgery date, what to expect pre-operatively and post operatively.  We discussed that a Pre-Admission Testing office will be calling to set up the pre-op visit that will take place prior to surgery, and that these appointments are typically done over the phone with a Pre-Admissions RN.  Informed patient that our office will communicate any additional care to be provided after surgery. Patients questions or concerns were discussed during our call. Advised to call our office should there be any additional information, questions or concerns that arise. Patient verbalized understanding.

## 2021-11-22 NOTE — Progress Notes (Signed)
[facility] Posting Form   Surgery Date/Time: Date: 12/16/2021  Surgeon: Dr. Hollice Espy, MD  Surgery Location: Day Surgery  Inpt ( No  )   Outpt (Yes)   Obs ( No  )   Diagnosis: Foreign Body of Bladder T19.1XXD, Irregular Prostate Fossa N42.9  -CPT: 38182, [id number]  Surgery: Removal of Bladder Foreign Body, Transurethral Resection of Prostate  Stop Anticoagulations: Yes and may continue ASA  Cardiac/Medical/Pulmonary Clearance needed: no  *Orders entered into EPIC  Date: 11/22/21   *Case booked in EPIC  Date: 11/19/2021  *Notified pt of Surgery: Date: 11/19/2021  PRE-OP UA & CX: yes, will obtain in clinic on 12/06/2021  *Placed into Prior Authorization Work Fabio Bering Date: 11/22/21  Assistant/laser/rep:No

## 2021-12-06 ENCOUNTER — Other Ambulatory Visit: Payer: Federal, State, Local not specified - PPO

## 2021-12-06 DIAGNOSIS — N429 Disorder of prostate, unspecified: Secondary | ICD-10-CM

## 2021-12-06 DIAGNOSIS — T191XXD Foreign body in bladder, subsequent encounter: Secondary | ICD-10-CM

## 2021-12-06 LAB — URINALYSIS, COMPLETE
Bilirubin, UA: NEGATIVE
Glucose, UA: NEGATIVE
Ketones, UA: NEGATIVE
Nitrite, UA: POSITIVE — AB
Protein,UA: NEGATIVE
Specific Gravity, UA: 1.02 (ref 1.005–1.030)
Urobilinogen, Ur: 0.2 mg/dL (ref 0.2–1.0)
pH, UA: 6.5 (ref 5.0–7.5)

## 2021-12-06 LAB — MICROSCOPIC EXAMINATION: WBC, UA: >30 /hpf — AB (ref 0–5)

## 2021-12-09 ENCOUNTER — Encounter
Admission: RE | Admit: 2021-12-09 | Discharge: 2021-12-09 | Disposition: A | Discharge status: Home / Self Care | Payer: Federal, State, Local not specified - PPO | Source: Ambulatory Visit | Attending: Urology | Admitting: Urology

## 2021-12-09 HISTORY — DX: Anxiety disorder, unspecified: F41.9

## 2021-12-09 HISTORY — DX: Bladder-neck obstruction: N32.0

## 2021-12-09 HISTORY — DX: Urinary tract infection, site not specified: N39.0

## 2021-12-09 HISTORY — DX: Acute kidney failure, unspecified: N17.9

## 2021-12-09 HISTORY — DX: Foreign body in bladder, initial encounter: T19.1XXA

## 2021-12-09 NOTE — Patient Instructions (Signed)
Your procedure is scheduled on:12-16-21 Monday Report to the Registration Desk on the 1st floor of the [facility].Then proceed to the 2nd floor Surgery Desk To find out your arrival time, please call [phone] between 1PM - 3PM on:12-13-21 Friday If your arrival time is 6:00 am, do not arrive prior to that time as the [facility] entrance doors do not open until 6:00 am.  REMEMBER: Instructions that are not followed completely may result in serious medical risk, up to and including death; or upon the discretion of your surgeon and anesthesiologist your surgery may need to be rescheduled.  Do not eat food after OR drink any liquids after midnight the night before surgery.  No gum chewing, lozengers or hard candies.  Do NOT take any medication the day of surgery  One week prior to surgery: Stop Anti-inflammatories (NSAIDS) such as Advil, Aleve, Ibuprofen, Motrin, Naproxen, Naprosyn and Aspirin based products such as Excedrin, Goodys Powder, BC Powder.You may however, take Tylenol if needed for pain up until the day of surgery.  Stop ANY OVER THE COUNTER supplements/vitamins NOW (12-09-21) until after surgery (multivitamin)  No Alcohol for 24 hours before or after surgery.  No Smoking including e-cigarettes for 24 hours prior to surgery.  No chewable tobacco products for at least 6 hours prior to surgery.  No nicotine patches on the day of surgery.  Do not use any "recreational" drugs for at least a week prior to your surgery.  Please be advised that the combination of cocaine and anesthesia may have negative outcomes, up to and including death. If you test positive for cocaine, your surgery will be cancelled.  On the morning of surgery brush your teeth with toothpaste and water, you may rinse your mouth with mouthwash if you wish. Do not swallow any toothpaste or mouthwash.  Do not wear jewelry, make-up, hairpins, clips or nail polish.  Do not wear lotions, powders, or perfumes.    Do not shave body from the neck down 48 hours prior to surgery just in case you cut yourself which could leave a site for infection.  Also, freshly shaved skin may become irritated if using the CHG soap.  Contact lenses, hearing aids and dentures may not be worn into surgery.  Do not bring valuables to the hospital. [facility] is not responsible for any missing/lost belongings or valuables.    Notify your doctor if there is any change in your medical condition (cold, fever, infection).  Wear comfortable clothing (specific to your surgery type) to the hospital.  After surgery, you can help prevent lung complications by doing breathing exercises.  Take deep breaths and cough every 1-2 hours. Your doctor may order a device called an Incentive Spirometer to help you take deep breaths. When coughing or sneezing, hold a pillow firmly against your incision with both hands. This is called "splinting." Doing this helps protect your incision. It also decreases belly discomfort.  If you are being admitted to the hospital overnight, leave your suitcase in the car. After surgery it may be brought to your room.  If you are being discharged the day of surgery, you will not be allowed to drive home. You will need a responsible adult (18 years or older) to drive you home and stay with you that night.   If you are taking public transportation, you will need to have a responsible adult (18 years or older) with you. Please confirm with your physician that it is acceptable to use public transportation.  Please call the [facility]. at [phone] if you have any questions about these instructions.  Surgery Visitation Policy:  Patients undergoing a surgery or procedure may have two family members or support persons with them as long as the person is not COVID-19 positive or experiencing its symptoms.   MASKING: Due to an increase in RSV rates and hospitalizations, in-patient care  areas in which we serve newborns, infants and children, masks will be required for teammates and visitors.  Children ages 69 and under may not visit. This policy affects the following departments only:  [place] Postpartum area Mother Baby Unit Newborn nursery/Special care nursery  Other areas: Masks continue to be strongly recommended for [facility] teammates, visitors and patients in all other areas. Visitation is not restricted outside of the units listed above.

## 2021-12-11 ENCOUNTER — Telehealth: Payer: Self-pay

## 2021-12-11 LAB — CULTURE, URINE COMPREHENSIVE

## 2021-12-11 MED ORDER — SULFAMETHOXAZOLE-TRIMETHOPRIM 800-160 MG PO TABS: 1.0000 | ORAL_TABLET | Freq: Two times a day (BID) | ORAL | 0 refills | Status: DC

## 2021-12-11 NOTE — Telephone Encounter (Signed)
-----   Message from Hollice Espy, MD sent at 12/11/2021  1:35 PM EST ----- Please treat preoperatively with Bactrim DS twice daily for total of 7 days starting 5 days before the surgery.  Hollice Espy, MD

## 2021-12-11 NOTE — Telephone Encounter (Signed)
Spoke with pt. Pt. Advised of results and verbalized understanding to start medication.

## 2021-12-14 NOTE — Anesthesia Preprocedure Evaluation (Signed)
Anesthesia Evaluation  Patient identified by MRN, date of birth, ID band Patient awake    Reviewed: Allergy & Precautions, NPO status , Patient's Chart, lab work & pertinent test results  History of Anesthesia Complications Negative for: history of anesthetic complications  Airway Mallampati: II   Neck ROM: Full    Dental no notable dental hx.    Pulmonary neg pulmonary ROS   Pulmonary exam normal breath sounds clear to auscultation       Cardiovascular Exercise Tolerance: Good negative cardio ROS Normal cardiovascular exam Rhythm:Regular Rate:Normal     Neuro/Psych   Anxiety      Neuromuscular disease (multiple sclerosis)    GI/Hepatic negative GI ROS,,,  Endo/Other  Obesity   Renal/GU negative Renal ROS   Prostate CA    Musculoskeletal   Abdominal   Peds  Hematology CLL   Anesthesia Other Findings   Reproductive/Obstetrics                             Anesthesia Physical Anesthesia Plan  ASA: 2  Anesthesia Plan: General   Post-op Pain Management:    Induction: Intravenous  PONV Risk Score and Plan: 2 and Ondansetron, Dexamethasone and Treatment may vary due to age or medical condition  Airway Management Planned: Oral ETT  Additional Equipment:   Intra-op Plan:   Post-operative Plan: Extubation in OR  Informed Consent: I have reviewed the patients History and Physical, chart, labs and discussed the procedure including the risks, benefits and alternatives for the proposed anesthesia with the patient or authorized representative who has indicated his/her understanding and acceptance.     Dental advisory given  Plan Discussed with: CRNA  Anesthesia Plan Comments: (Anesthetic considerations for multiple sclerosis: closely monitor body temperature to avoid increase above baseline, avoid succinylcholine, pt may have altered sensitivity to NDMRs.   Patient consented for  risks of anesthesia including but not limited to:  - adverse reactions to medications - damage to eyes, teeth, lips or other oral mucosa - nerve damage due to positioning  - sore throat or hoarseness - damage to heart, brain, nerves, lungs, other parts of body or loss of life  Informed patient about role of CRNA in peri- and intra-operative care.  Patient voiced understanding.)        Anesthesia Quick Evaluation

## 2021-12-15 MED ORDER — LACTATED RINGERS IV SOLN: INTRAVENOUS | Status: DC

## 2021-12-15 MED ORDER — CHLORHEXIDINE GLUCONATE 0.12 % MT SOLN: 15.0000 mL | Freq: Once | OROMUCOSAL | Status: AC

## 2021-12-15 MED ORDER — FAMOTIDINE 20 MG PO TABS: 20.0000 mg | ORAL_TABLET | Freq: Once | ORAL | Status: AC

## 2021-12-15 MED ORDER — CEFAZOLIN SODIUM-DEXTROSE 2-4 GM/100ML-% IV SOLN
2.0000 g | INTRAVENOUS | Status: AC
  Administered 2021-12-16: 2 g via INTRAVENOUS

## 2021-12-15 MED ORDER — ORAL CARE MOUTH RINSE: 15.0000 mL | Freq: Once | OROMUCOSAL | Status: AC

## 2021-12-16 ENCOUNTER — Encounter
Admission: RE | Disposition: A | Discharge status: Home / Self Care | Payer: Self-pay | Source: Ambulatory Visit | Attending: Urology

## 2021-12-16 ENCOUNTER — Ambulatory Visit
Admission: RE | Admit: 2021-12-16 | Discharge: 2021-12-16 | Disposition: A | Discharge status: Home / Self Care | Payer: Federal, State, Local not specified - PPO | Source: Ambulatory Visit | Attending: Urology | Admitting: Urology

## 2021-12-16 ENCOUNTER — Other Ambulatory Visit: Payer: Self-pay

## 2021-12-16 ENCOUNTER — Encounter: Payer: Self-pay | Admitting: Urology

## 2021-12-16 ENCOUNTER — Ambulatory Visit: Payer: Federal, State, Local not specified - PPO | Admitting: Anesthesiology

## 2021-12-16 ENCOUNTER — Ambulatory Visit: Payer: Federal, State, Local not specified - PPO | Admitting: Urgent Care

## 2021-12-16 DIAGNOSIS — N401 Enlarged prostate with lower urinary tract symptoms: Secondary | ICD-10-CM | POA: Diagnosis not present

## 2021-12-16 DIAGNOSIS — C9110 Chronic lymphocytic leukemia of B-cell type not having achieved remission: Secondary | ICD-10-CM | POA: Insufficient documentation

## 2021-12-16 DIAGNOSIS — N400 Enlarged prostate without lower urinary tract symptoms: Secondary | ICD-10-CM | POA: Diagnosis not present

## 2021-12-16 DIAGNOSIS — Z6830 Body mass index (BMI) 30.0-30.9, adult: Secondary | ICD-10-CM | POA: Diagnosis not present

## 2021-12-16 DIAGNOSIS — D075 Carcinoma in situ of prostate: Secondary | ICD-10-CM | POA: Diagnosis not present

## 2021-12-16 DIAGNOSIS — F419 Anxiety disorder, unspecified: Secondary | ICD-10-CM | POA: Diagnosis not present

## 2021-12-16 DIAGNOSIS — T191XXD Foreign body in bladder, subsequent encounter: Secondary | ICD-10-CM

## 2021-12-16 DIAGNOSIS — T191XXA Foreign body in bladder, initial encounter: Secondary | ICD-10-CM | POA: Diagnosis not present

## 2021-12-16 DIAGNOSIS — N4289 Other specified disorders of prostate: Secondary | ICD-10-CM | POA: Diagnosis not present

## 2021-12-16 DIAGNOSIS — E669 Obesity, unspecified: Secondary | ICD-10-CM | POA: Insufficient documentation

## 2021-12-16 DIAGNOSIS — G35 Multiple sclerosis: Secondary | ICD-10-CM | POA: Insufficient documentation

## 2021-12-16 DIAGNOSIS — E785 Hyperlipidemia, unspecified: Secondary | ICD-10-CM | POA: Diagnosis not present

## 2021-12-16 DIAGNOSIS — N429 Disorder of prostate, unspecified: Secondary | ICD-10-CM

## 2021-12-16 HISTORY — PX: CYSTOSCOPY: SHX5120

## 2021-12-16 HISTORY — PX: TRANSURETHRAL RESECTION OF PROSTATE: SHX73

## 2021-12-16 SURGERY — CYSTOSCOPY
Anesthesia: General

## 2021-12-16 MED ORDER — PROPOFOL 10 MG/ML IV BOLUS
INTRAVENOUS | Status: DC | PRN
  Administered 2021-12-16: 200 mg via INTRAVENOUS

## 2021-12-16 MED ORDER — MIDAZOLAM HCL 2 MG/2ML IJ SOLN
INTRAMUSCULAR | Status: DC | PRN
  Administered 2021-12-16: 2 mg via INTRAVENOUS

## 2021-12-16 MED ORDER — ONDANSETRON HCL 4 MG/2ML IJ SOLN
INTRAMUSCULAR | Status: DC | PRN
  Administered 2021-12-16: 4 mg via INTRAVENOUS

## 2021-12-16 MED ORDER — ROCURONIUM BROMIDE 100 MG/10ML IV SOLN
INTRAVENOUS | Status: DC | PRN
  Administered 2021-12-16: 30 mg via INTRAVENOUS
  Administered 2021-12-16 (×2): 10 mg via INTRAVENOUS

## 2021-12-16 MED ORDER — DEXMEDETOMIDINE HCL IN NACL 80 MCG/20ML IV SOLN
INTRAVENOUS | Status: AC
  Filled 2021-12-16: qty 20

## 2021-12-16 MED ORDER — SUGAMMADEX SODIUM 200 MG/2ML IV SOLN
INTRAVENOUS | Status: DC | PRN
  Administered 2021-12-16: 199.6 mg via INTRAVENOUS

## 2021-12-16 MED ORDER — CEFAZOLIN SODIUM-DEXTROSE 2-4 GM/100ML-% IV SOLN
INTRAVENOUS | Status: AC
  Filled 2021-12-16: qty 100

## 2021-12-16 MED ORDER — SODIUM CHLORIDE 0.9 % IR SOLN
Status: DC | PRN
  Administered 2021-12-16: 6000 mL
  Administered 2021-12-16 (×3): 3000 mL
  Administered 2021-12-16: 6000 mL
  Administered 2021-12-16: 2000 mL
  Administered 2021-12-16 (×3): 3000 mL

## 2021-12-16 MED ORDER — FAMOTIDINE 20 MG PO TABS
ORAL_TABLET | ORAL | Status: AC
  Administered 2021-12-16: 20 mg via ORAL
  Filled 2021-12-16: qty 1

## 2021-12-16 MED ORDER — HYDROCODONE-ACETAMINOPHEN 5-325 MG PO TABS: 1.0000 | ORAL_TABLET | Freq: Four times a day (QID) | ORAL | 0 refills | Status: DC | PRN

## 2021-12-16 MED ORDER — ONDANSETRON HCL 4 MG/2ML IJ SOLN: 4.0000 mg | Freq: Once | INTRAMUSCULAR | Status: DC | PRN

## 2021-12-16 MED ORDER — DEXAMETHASONE SODIUM PHOSPHATE 10 MG/ML IJ SOLN
INTRAMUSCULAR | Status: AC
  Filled 2021-12-16: qty 1

## 2021-12-16 MED ORDER — OXYCODONE HCL 5 MG PO TABS: 5.0000 mg | ORAL_TABLET | Freq: Once | ORAL | Status: DC | PRN

## 2021-12-16 MED ORDER — ROCURONIUM BROMIDE 10 MG/ML (PF) SYRINGE
PREFILLED_SYRINGE | INTRAVENOUS | Status: AC
  Filled 2021-12-16: qty 10

## 2021-12-16 MED ORDER — OXYBUTYNIN CHLORIDE 5 MG PO TABS: 5.0000 mg | ORAL_TABLET | Freq: Three times a day (TID) | ORAL | 0 refills | Status: DC | PRN

## 2021-12-16 MED ORDER — SUCCINYLCHOLINE CHLORIDE 200 MG/10ML IV SOSY
PREFILLED_SYRINGE | INTRAVENOUS | Status: AC
  Filled 2021-12-16: qty 10

## 2021-12-16 MED ORDER — LIDOCAINE HCL (CARDIAC) PF 100 MG/5ML IV SOSY
PREFILLED_SYRINGE | INTRAVENOUS | Status: DC | PRN
  Administered 2021-12-16: 100 mg via INTRAVENOUS

## 2021-12-16 MED ORDER — FUROSEMIDE 10 MG/ML IJ SOLN
INTRAMUSCULAR | Status: DC | PRN
  Administered 2021-12-16: 10 mg via INTRAMUSCULAR

## 2021-12-16 MED ORDER — LACTATED RINGERS IV SOLN: INTRAVENOUS | Status: DC

## 2021-12-16 MED ORDER — CHLORHEXIDINE GLUCONATE 0.12 % MT SOLN
OROMUCOSAL | Status: AC
  Administered 2021-12-16: 15 mL via OROMUCOSAL
  Filled 2021-12-16: qty 15

## 2021-12-16 MED ORDER — MIDAZOLAM HCL 2 MG/2ML IJ SOLN
INTRAMUSCULAR | Status: AC
  Filled 2021-12-16: qty 2

## 2021-12-16 MED ORDER — FENTANYL CITRATE (PF) 100 MCG/2ML IJ SOLN: 25.0000 ug | INTRAMUSCULAR | Status: DC | PRN

## 2021-12-16 MED ORDER — OXYCODONE HCL 5 MG/5ML PO SOLN: 5.0000 mg | Freq: Once | ORAL | Status: DC | PRN

## 2021-12-16 MED ORDER — FUROSEMIDE 10 MG/ML IJ SOLN
INTRAMUSCULAR | Status: AC
  Filled 2021-12-16: qty 4

## 2021-12-16 MED ORDER — ACETAMINOPHEN 10 MG/ML IV SOLN: 1000.0000 mg | Freq: Once | INTRAVENOUS | Status: DC | PRN

## 2021-12-16 MED ORDER — ONDANSETRON HCL 4 MG/2ML IJ SOLN
INTRAMUSCULAR | Status: AC
  Filled 2021-12-16: qty 2

## 2021-12-16 MED ORDER — DEXAMETHASONE SODIUM PHOSPHATE 10 MG/ML IJ SOLN
INTRAMUSCULAR | Status: DC | PRN
  Administered 2021-12-16: 8 mg via INTRAVENOUS

## 2021-12-16 MED ORDER — PROPOFOL 1000 MG/100ML IV EMUL
INTRAVENOUS | Status: AC
  Filled 2021-12-16: qty 100

## 2021-12-16 MED ORDER — PROPOFOL 10 MG/ML IV BOLUS
INTRAVENOUS | Status: AC
  Filled 2021-12-16: qty 40

## 2021-12-16 MED ORDER — FENTANYL CITRATE (PF) 100 MCG/2ML IJ SOLN
INTRAMUSCULAR | Status: DC | PRN
  Administered 2021-12-16: 25 ug via INTRAVENOUS
  Administered 2021-12-16: 50 ug via INTRAVENOUS
  Administered 2021-12-16: 25 ug via INTRAVENOUS

## 2021-12-16 MED ORDER — FENTANYL CITRATE (PF) 100 MCG/2ML IJ SOLN
INTRAMUSCULAR | Status: AC
  Filled 2021-12-16: qty 2

## 2021-12-16 SURGICAL SUPPLY — 35 items
ADAPTER IRRIG TUBE 2 SPIKE SOL (ADAPTER) ×2 IMPLANT
ADPR TBG 2 SPK PMP STRL ASCP (ADAPTER)
BAG DRAIN SIEMENS DORNER NS (MISCELLANEOUS) ×1 IMPLANT
BAG DRN LRG CPC RND TRDRP CNTR (MISCELLANEOUS) ×1
BAG DRN NS LF (MISCELLANEOUS) ×1
BAG URO DRAIN 4000ML (MISCELLANEOUS) ×1 IMPLANT
BRUSH SCRUB EZ 1% IODOPHOR (MISCELLANEOUS) ×1 IMPLANT
CATH FOL 2WAY LX 20X30 (CATHETERS) IMPLANT
CATH FOL 2WAY LX 24X30 (CATHETERS) IMPLANT
CATH URETL OPEN 5X70 (CATHETERS) ×1 IMPLANT
CNTNR SPEC 2.5X3XGRAD LEK (MISCELLANEOUS) ×1
CONT SPEC 4OZ STRL OR WHT (MISCELLANEOUS) ×1
CONTAINER SPEC 2.5X3XGRAD LEK (MISCELLANEOUS) IMPLANT
DRAPE UTILITY 15X26 TOWEL STRL (DRAPES) ×1 IMPLANT
ELECT LOOP 22F BIPOLAR SML (ELECTROSURGICAL) ×1
ELECTRODE LOOP 22F BIPOLAR SML (ELECTROSURGICAL) IMPLANT
GAUZE 4X4 16PLY ~~LOC~~+RFID DBL (SPONGE) ×2 IMPLANT
GLOVE BIO SURGEON STRL SZ 6.5 (GLOVE) ×1 IMPLANT
GOWN STRL REUS W/ TWL LRG LVL3 (GOWN DISPOSABLE) ×2 IMPLANT
GOWN STRL REUS W/TWL LRG LVL3 (GOWN DISPOSABLE) ×2
GUIDEWIRE STR DUAL SENSOR (WIRE) ×2 IMPLANT
HOLDER FOLEY CATH W/STRAP (MISCELLANEOUS) ×1 IMPLANT
IV NS IRRIG 3000ML ARTHROMATIC (IV SOLUTION) ×6 IMPLANT
KIT TURNOVER CYSTO (KITS) ×1 IMPLANT
LOOP CUT BIPOLAR 24F LRG (ELECTROSURGICAL) IMPLANT
MANIFOLD NEPTUNE II (INSTRUMENTS) ×1 IMPLANT
PACK CYSTO AR (MISCELLANEOUS) ×1 IMPLANT
SET CYSTO W/LG BORE CLAMP LF (SET/KITS/TRAYS/PACK) ×1 IMPLANT
SET IRRIG Y TYPE TUR BLADDER L (SET/KITS/TRAYS/PACK) ×1 IMPLANT
SURGILUBE 2OZ TUBE FLIPTOP (MISCELLANEOUS) ×1 IMPLANT
SYR TOOMEY IRRIG 70ML (MISCELLANEOUS) ×1
SYRINGE TOOMEY IRRIG 70ML (MISCELLANEOUS) ×1 IMPLANT
TRAP FLUID SMOKE EVACUATOR (MISCELLANEOUS) ×1 IMPLANT
WATER STERILE IRR 1000ML POUR (IV SOLUTION) ×1 IMPLANT
WATER STERILE IRR 500ML POUR (IV SOLUTION) ×1 IMPLANT

## 2021-12-16 NOTE — Anesthesia Procedure Notes (Signed)
Procedure Name: Intubation Date/Time: 12/16/2021 10:12 AM  Performed by: Jacqualin Combes, CRNAPre-anesthesia Checklist: Patient identified, Emergency Drugs available, Suction available and Patient being monitored Patient Re-evaluated:Patient Re-evaluated prior to induction Oxygen Delivery Method: Circle system utilized Preoxygenation: Pre-oxygenation with 100% oxygen Induction Type: IV induction Ventilation: Mask ventilation without difficulty Laryngoscope Size: Mac and 4 Grade View: Grade I Tube type: Oral Tube size: 7.5 mm Number of attempts: 1 Airway Equipment and Method: Stylet and Oral airway Placement Confirmation: ETT inserted through vocal cords under direct vision, positive ETCO2 and breath sounds checked- equal and bilateral Secured at: 22 cm Tube secured with: Tape Dental Injury: Teeth and Oropharynx as per pre-operative assessment  Comments: Cords clear. Ca

## 2021-12-16 NOTE — Anesthesia Postprocedure Evaluation (Signed)
Anesthesia Post Note  Patient: Norlan Rann Ground  Procedure(s) Performed: CYSTOSCOPY WITH REMOVAL OF FOREIGN BODY TRANSURETHRAL RESECTION OF THE PROSTATE (TURP)  Patient location during evaluation: PACU Anesthesia Type: General Level of consciousness: awake and alert, oriented and patient cooperative Pain management: pain level controlled Vital Signs Assessment: post-procedure vital signs reviewed and stable Respiratory status: spontaneous breathing, nonlabored ventilation and respiratory function stable Cardiovascular status: blood pressure returned to baseline and stable Postop Assessment: adequate PO intake Anesthetic complications: no   No notable events documented.   Last Vitals:  Vitals:   12/16/21 1200 12/16/21 1215  BP: 119/74 119/72  Pulse: 88 88  Resp: 15 16  Temp:    SpO2: 96% 96%    Last Pain:  Vitals:   12/16/21 1215  TempSrc:   PainSc: 0-No pain                 Darrin Nipper

## 2021-12-16 NOTE — Discharge Instructions (Addendum)

## 2021-12-16 NOTE — Transfer of Care (Signed)
Immediate Anesthesia Transfer of Care Note  Patient: Ralph [name]  Procedure(s) Performed: CYSTOSCOPY WITH REMOVAL OF FOREIGN BODY TRANSURETHRAL RESECTION OF THE PROSTATE (TURP)  Patient Location: PACU  Anesthesia Type:General  Level of Consciousness: awake, alert , and oriented  Airway & Oxygen Therapy: Patient Spontanous Breathing and Patient connected to face mask oxygen  Post-op Assessment: Report given to RN and Post -op Vital signs reviewed and stable  Post vital signs: stable  Last Vitals:  Vitals Value Taken Time  BP 132/84 12/16/21 1138  Temp    Pulse 92 12/16/21 1141  Resp 17 12/16/21 1141  SpO2 100 % 12/16/21 1141  Vitals shown include unvalidated device data.  Last Pain:  Vitals:   12/16/21 0829  TempSrc: Temporal  PainSc: 0-No pain         Complications: No notable events documented.

## 2021-12-16 NOTE — H&P (Signed)
12/16/21 R[facility] CTAB  Currently on abx for + UCx        CC:     Chief Complaint  Patient presents with   Cysto      HPI: 56 year old male who presents today for evaluation of bladder calcification/foreign body status post holep   He had been scheduled for cystoscopy at last appointment but his urinalysis was frankly positive.  He was having some mild intermittent dysuria which with antibiotics has resolved.  His last dose was Wednesday.   Today, he is asymptomatic.   Please see previous notes for details.   NED. A&Ox3.   No respiratory distress   Abd soft, NT, ND Normal phallus with bilateral descended testicles   Cystoscopy Procedure Note   Patient identification was confirmed, informed consent was obtained, and patient was prepped using Betadine solution.  Lidocaine jelly was administered per urethral meatus.       Pre-Procedure: - Inspection reveals a normal caliber ureteral meatus.   Procedure: The flexible cystoscope was introduced without difficulty - No urethral strictures/lesions are present. -Irregular prostate with abnormal fossa, somewhat to difficult to navigate but ultimately able to find the true lumen into the bladder which was anterior.  There is a hanging fibrinous [place] a calcified material at the left bladder neck consistent with necrotic tissue.  Remainder of the bladder was unremarkable although somewhat obscured by prostatic bleeding with manipulation and cloudiness   Post-Procedure: - Patient tolerated the procedure well   Assessment/ Plan:   1. BPH with urinary obstruction Status post holep with improved urinary symptoms although evidence of retained foreign body as below   This likely contributing to recent infection.   In addition to this, he has a marked irregular prostatic fossa and would likely benefit from additional resection here as we will facilitate urinary flow dynamics as well as ability to place a catheter as needed in the  future.   I recommended proceeding to the operating room for cystoscopy, evacuation of bladder foreign body (necrotic prostate material ) as well as TURP as outlined above.  Risk including risk of bleeding, infection, demonstrating structures, need for further procedures, ejaculatory dysfunction, as well as need for postoperative Foley catheter discussed.  All questions answered.   2. Foreign body in bladder, subsequent encounter As above   3. History of UTI As above

## 2021-12-16 NOTE — Op Note (Signed)
Date of procedure: 12/16/21  Preoperative diagnosis:  BPH with irregular prostatic fossa Bladder foreign body  Postoperative diagnosis:  Same as above  Procedure: TURP Excision of bladder foreign body  Surgeon: Ralph Espy, MD  Anesthesia: General  Complications: None  Intraoperative findings: Bladder foreign body consistent with calcified prostate chips, removed from bladder without difficulty.  Markedly irregular prostatic fossa with fusion of the prostatic apex lateral lobes with significant regrowth in the interim.  TURP performed to recontour the fossa.  EBL: 100 cc  Specimens: Prostate chips  Drains: 69 French Foley catheter with 50 cc in the balloon  Indication: Ralph [name] is a 56 y.o. patient with incidental calcified bladder mass consistent with calcified HoLEP prostate tissue residual from previous surgery as well as irregular prostatic fossa.  After reviewing the management options for treatment, he elected to proceed with the above surgical procedure(s). We have discussed the potential benefits and risks of the procedure, side effects of the proposed treatment, the likelihood of the patient achieving the goals of the procedure, and any potential problems that might occur during the procedure or recuperation. Informed consent has been obtained.  Description of procedure:  The patient was taken to the operating room and general anesthesia was induced.  The patient was placed in the dorsal lithotomy position, prepped and draped in the usual sterile fashion, and preoperative antibiotics were administered. A preoperative time-out was performed.   A 26 French resectoscope using blunt angled operator was induced into the bladder.  The debris the right bladder neck was consistent with necrotic calcified prostate material.  I was able to use the loop of the bipolar resectoscope using water as the medium to [place] this, resect this into smaller pieces then evacuated from  the bladder.  I then proceeded with TURP procedure.  There was actually fairly significant prostatic regrowth which was quite unusual and irregular and there was a fusion near the apex of the prostate of the lateral lobes that are grown back together.  I started by taking down the median lobe and elevated bladder neck portion just proximal to the verumontanum and then care was taken to resect fairly large volume of both of the lateral lobes creating a nice wide open channel by the end of the procedure.  They worked very diligently to try to achieve hemostasis using the bipolar loop.  All of the prostate chips were evacuated from the bladder.  At this time, hemostasis was noted to be adequate.  I did have the patient diuresed with 10 mg of IV Lasix as well as his fluids and a more brisk rate.  A 20 French two-way Foley catheter was placed using a catheter guide.  It was irrigated to confirm position.  The balloon was filled with 50 cc of sterile water.  It was placed a little bit of tension and the urine was chary but translucent.  He was then cleaned and dried, repositioned in supine position, reversed from anesthesia, taken to the PACU in stable condition.  Plan: Will have him return in 2 days for his Foley catheter removal.  Ralph [name], M.D.

## 2021-12-18 ENCOUNTER — Encounter: Payer: Self-pay | Admitting: Physician Assistant

## 2021-12-18 ENCOUNTER — Ambulatory Visit: Payer: Federal, State, Local not specified - PPO | Admitting: Physician Assistant

## 2021-12-18 VITALS — BP 124/75 | HR 101 | Ht 70.0 in | Wt 220.0 lb

## 2021-12-18 DIAGNOSIS — N138 Other obstructive and reflux uropathy: Secondary | ICD-10-CM

## 2021-12-18 DIAGNOSIS — N401 Enlarged prostate with lower urinary tract symptoms: Secondary | ICD-10-CM

## 2021-12-18 NOTE — Progress Notes (Deleted)
.  Ralph [name]

## 2021-12-18 NOTE — Progress Notes (Signed)
Patient ID: Ralph [name], male   DOB: [birth year], 56 y.o.   MRN: [id number] Catheter Removal  Patient is present today for a catheter removal.  109m of water was drained from the balloon. A 20FR foley cath was removed from the bladder, no complications were noted. Patient tolerated well.  Performed by: DEdwin Dada CMA  Additional notes: Counseled patient on normal postoperative findings including dysuria, gross hematuria, and urinary urgency/leakage. Counseled patient to begin Kegel exercises 3x10 sets daily to increase urinary control and wear absorbent products as needed for security. Written and verbal resources provided today. Surgical pathology pending; will defer to Dr. BErlene Quanto share results when available.   Follow up/ Additional notes: Jan 2024 with BErlene Quan

## 2021-12-18 NOTE — Patient Instructions (Signed)
Congratulations on your recent TURP procedure! As discussed in clinic today, these are the three normal postoperative findings after this surgery: Burning or pain with urination: This typically resolves within 1 week of surgery. If you are still having significant pain with urination 10 days after surgery, please call our clinic. We may need to check you for a urinary tract infection at that point, though this is rare. Blood in the urine: This may either come and go or steadily improve before going away, but typically resolves completely within 3 weeks of surgery. If you are on blood thinners, it may take longer for the bleeding to resolve. Please note that you may find that you pass clumps of tissue or debris around 10-14 days after surgery associated with some new bleeding. This is due to sloughing of your postoperative scab and is also normal. If at any point you start to pass dark red urine; thick, ketchup-like urine; or large blood clots around the size of your palm, please call our office immediately. Urinary leakage or urgency: This tends to improve with time, with most patients becoming dry within around 3 months of surgery. You may wear absorbant underwear or liners for security during this time. To help you get dry faster, please make sure you are completing your Kegel exercises as instructed, with a set of 10 exercises completed up to three times daily. Further information on Kegel exercises can be found in the back of this packet.

## 2021-12-19 LAB — SURGICAL PATHOLOGY

## 2022-01-09 ENCOUNTER — Ambulatory Visit: Payer: Federal, State, Local not specified - PPO | Admitting: Podiatry

## 2022-01-16 ENCOUNTER — Ambulatory Visit: Payer: Federal, State, Local not specified - PPO | Admitting: Physician Assistant

## 2022-01-16 VITALS — BP 123/76 | HR 88 | Temp 99.1°F | Wt 212.5 lb

## 2022-01-16 DIAGNOSIS — N401 Enlarged prostate with lower urinary tract symptoms: Secondary | ICD-10-CM | POA: Diagnosis not present

## 2022-01-16 DIAGNOSIS — N138 Other obstructive and reflux uropathy: Secondary | ICD-10-CM | POA: Diagnosis not present

## 2022-01-16 DIAGNOSIS — N453 Epididymo-orchitis: Secondary | ICD-10-CM | POA: Diagnosis not present

## 2022-01-16 LAB — BLADDER SCAN AMB NON-IMAGING: Scan Result: 169

## 2022-01-16 MED ORDER — LEVOFLOXACIN 500 MG PO TABS: 500.0000 mg | ORAL_TABLET | Freq: Every day | ORAL | 0 refills | Status: DC

## 2022-01-16 NOTE — Patient Instructions (Signed)
Please start your antibiotics today. If you develop a fever, worsening pain, or worsening swelling, please go to the Emergency Department for IV antibiotics.

## 2022-01-16 NOTE — Progress Notes (Signed)
01/16/2022 3:55 PM   Ralph [name] 10/13/1965 [id number]  CC: Chief Complaint  Patient presents with   Benign Prostatic Hypertrophy    Follow up   HPI: Ralph [name] is a 57 y.o. male with PMH BPH with urinary obstruction who underwent TURP with excision of bladder foreign body with Dr. Erlene Quan on 12/16/2021 who presents today for evaluation of possible UTI.   Today he reports an approximate 5-day history of R>L testicular tenderness with possible swelling and low back pain.  He noticed passing a piece of tissue in his urine 3 days ago about the size of his pinky nail.  He denies fever, chills, nausea, and vomiting.  He is not sexually active.  In-office UA today positive for 3+ blood, 2+ protein, nitrites, and 2+ leukocytes; urine microscopy with >30 WBCs/HPF, >30 RBCs/HPF, and many bacteria. PVR 1107m.  PMH: Past Medical History:  Diagnosis Date   Acute renal failure (HCC)    Anxiety    Bladder outlet obstruction    CLL (chronic lymphocytic leukemia) (HCC)    Elevated PSA    Foreign body of bladder    MS (multiple sclerosis) ([place]    Pre-diabetes    [place]state cancer [place] 2013   UTI (urinary tract infection)     Surgical History: Past Surgical History:  Procedure Laterality Date   CYSTOSCOPY N/A 12/16/2021   Procedure: CYSTOSCOPY WITH REMOVAL OF FOREIGN BODY;  Surgeon: BHollice Espy MD;  Location: [facility] ORS;  Service: Urology;  Laterality: N/A;   HOLEP-LASER ENUCLEATION OF THE PROSTATE WITH MORCELLATION N/A 03/11/2021   Procedure: HOLEP-LASER ENUCLEATION OF THE PROSTATE WITH MORCELLATION;  Surgeon: BHollice Espy MD;  Location: [facility] ORS;  Service: Urology;  Laterality: N/A;   KNEE SURGERY  2002   meniscus   TRANSURETHRAL RESECTION OF PROSTATE N/A 12/16/2021   Procedure: TRANSURETHRAL RESECTION OF THE PROSTATE (TURP);  Surgeon: BHollice Espy MD;  Location: [facility] ORS;  Service: Urology;  Laterality: N/A;    Home Medications:  Allergies as of 01/16/2022    No Known Allergies      Medication List        Accurate as of January 16, 2022  3:55 PM. If you have any questions, ask your nurse or doctor.          STOP taking these medications    sulfamethoxazole-trimethoprim 800-160 MG tablet Commonly known as: BACTRIM DS       TAKE these medications    multivitamin tablet Take 1 tablet by mouth daily.        Allergies:  No Known Allergies  Family History: Family History  Problem Relation Age of Onset   Cancer Sister        breast CA     Social History:   reports that he has never smoked. He has never been exposed to tobacco smoke. He has never used smokeless tobacco. He reports that he does not drink alcohol and does not use drugs.  Physical Exam: BP 123/76   Pulse 88   Wt 212 lb 8 oz (96.4 kg)   BMI 30.49 kg/m   Constitutional:  Alert and oriented, no acute distress, nontoxic appearing HEENT: [place], AT Cardiovascular: No clubbing, cyanosis, or edema Respiratory: Normal respiratory effort, no increased work of breathing GU: Bilateral descended testicles.  Enlargement and tenderness of the right testicle and epididymis.  No scrotal edema, erythema, crepitus, or fluctuance. Skin: No rashes, bruises or suspicious lesions Neurologic: Grossly intact, no focal deficits, moving all 4 extremities Psychiatric:  Normal mood and affect  Laboratory Data: Results for orders placed or performed in visit on 01/16/22  Microscopic Examination   Urine  Result Value Ref Range   WBC, UA >30 (A) 0 - 5 /hpf   RBC, Urine >30 (A) 0 - 2 /hpf   Epithelial Cells (non renal) 0-10 0 - 10 /hpf   Casts Present (A) None seen /lpf   Cast Type Hyaline casts N/A   Bacteria, UA Many (A) None seen/Few  Urinalysis, Complete  Result Value Ref Range   Specific Gravity, UA 1.025 1.005 - 1.030   pH, UA 5.5 5.0 - 7.5   Color, UA Yellow Yellow   Appearance Ur Hazy (A) Clear   Leukocytes,UA 2+ (A) Negative   Protein,UA 2+ (A) Negative/Trace    Glucose, UA Negative Negative   Ketones, UA Negative Negative   RBC, UA 3+ (A) Negative   Bilirubin, UA Negative Negative   Urobilinogen, Ur 0.2 0.2 - 1.0 mg/dL   Nitrite, UA Positive (A) Negative   Microscopic Examination See below:   Bladder Scan (Post Void Residual) in office  Result Value Ref Range   Scan Result 169 ml    Assessment & Plan:   1. Epididymoorchitis UA appears grossly infected today and physical exam is consistent with right epididymoorchitis.  He is afebrile and well-appearing in clinic today.  No indication for urgent imaging given duration of symptoms.  Will start empiric Levaquin and send for culture for further evaluation.  We discussed return precautions including fever, worsening swelling, and worsening pain.  He expressed understanding. - Urinalysis, Complete - Bladder Scan (Post Void Residual) in office - CULTURE, URINE COMPREHENSIVE - levofloxacin (LEVAQUIN) 500 MG tablet; Take 1 tablet (500 mg total) by mouth daily.  Dispense: 10 tablet; Refill: 0   Return if symptoms worsen or fail to improve.  Debroah Loop, PA-C  [facility] [address], [place], [place] [address] [phone]

## 2022-01-17 DIAGNOSIS — Z01818 Encounter for other preprocedural examination: Secondary | ICD-10-CM | POA: Diagnosis not present

## 2022-01-17 DIAGNOSIS — Z1211 Encounter for screening for malignant neoplasm of colon: Secondary | ICD-10-CM | POA: Diagnosis not present

## 2022-01-17 LAB — URINALYSIS, COMPLETE
Bilirubin, UA: NEGATIVE
Glucose, UA: NEGATIVE
Ketones, UA: NEGATIVE
Nitrite, UA: POSITIVE — AB
Specific Gravity, UA: 1.025 (ref 1.005–1.030)
Urobilinogen, Ur: 0.2 mg/dL (ref 0.2–1.0)
pH, UA: 5.5 (ref 5.0–7.5)

## 2022-01-17 LAB — MICROSCOPIC EXAMINATION
RBC, Urine: >30 /hpf — AB (ref 0–2)
WBC, UA: >30 /hpf — AB (ref 0–5)

## 2022-01-20 ENCOUNTER — Inpatient Hospital Stay: Payer: Federal, State, Local not specified - PPO | Attending: Internal Medicine

## 2022-01-20 ENCOUNTER — Encounter: Payer: Self-pay | Admitting: Internal Medicine

## 2022-01-20 ENCOUNTER — Inpatient Hospital Stay (HOSPITAL_BASED_OUTPATIENT_CLINIC_OR_DEPARTMENT_OTHER): Payer: Federal, State, Local not specified - PPO | Admitting: Internal Medicine

## 2022-01-20 VITALS — BP 114/78 | HR 85 | Temp 97.7°F | Resp 16 | Wt 209.9 lb

## 2022-01-20 DIAGNOSIS — E782 Mixed hyperlipidemia: Secondary | ICD-10-CM | POA: Diagnosis not present

## 2022-01-20 DIAGNOSIS — C9110 Chronic lymphocytic leukemia of B-cell type not having achieved remission: Secondary | ICD-10-CM | POA: Insufficient documentation

## 2022-01-20 DIAGNOSIS — Z79899 Other long term (current) drug therapy: Secondary | ICD-10-CM | POA: Insufficient documentation

## 2022-01-20 DIAGNOSIS — Z8546 Personal history of malignant neoplasm of prostate: Secondary | ICD-10-CM | POA: Diagnosis not present

## 2022-01-20 DIAGNOSIS — N400 Enlarged prostate without lower urinary tract symptoms: Secondary | ICD-10-CM | POA: Diagnosis not present

## 2022-01-20 DIAGNOSIS — Z9079 Acquired absence of other genital organ(s): Secondary | ICD-10-CM | POA: Insufficient documentation

## 2022-01-20 LAB — CBC WITH DIFFERENTIAL/PLATELET
Abs Immature Granulocytes: 0.45 10*3/uL — ABNORMAL HIGH (ref 0.00–0.07)
Basophils Absolute: 0.1 10*3/uL (ref 0.0–0.1)
Basophils Relative: 0 %
Eosinophils Absolute: 0.3 10*3/uL (ref 0.0–0.5)
Eosinophils Relative: 0 %
HCT: 38.8 % — ABNORMAL LOW (ref 39.0–52.0)
Hemoglobin: 11.7 g/dL — ABNORMAL LOW (ref 13.0–17.0)
Immature Granulocytes: 0 %
Lymphocytes Relative: 83 %
Lymphs Abs: 96.9 10*3/uL — ABNORMAL HIGH (ref 0.7–4.0)
MCH: 27.1 pg (ref 26.0–34.0)
MCHC: 30.2 g/dL (ref 30.0–36.0)
MCV: 89.8 fL (ref 80.0–100.0)
Monocytes Absolute: 15.3 10*3/uL — ABNORMAL HIGH (ref 0.1–1.0)
Monocytes Relative: 13 %
Neutro Abs: 4.2 10*3/uL (ref 1.7–7.7)
Neutrophils Relative %: 4 %
Platelets: 221 10*3/uL (ref 150–400)
RBC: 4.32 MIL/uL (ref 4.22–5.81)
RDW: 15.5 % (ref 11.5–15.5)
Smear Review: NORMAL
WBC: 117.2 10*3/uL (ref 4.0–10.5)
nRBC: 0 % (ref 0.0–0.2)

## 2022-01-20 LAB — CULTURE, URINE COMPREHENSIVE

## 2022-01-20 LAB — COMPREHENSIVE METABOLIC PANEL
ALT: 16 U/L (ref 0–44)
AST: 19 U/L (ref 15–41)
Albumin: 4 g/dL (ref 3.5–5.0)
Alkaline Phosphatase: 96 U/L (ref 38–126)
Anion gap: 11 (ref 5–15)
BUN: 24 mg/dL — ABNORMAL HIGH (ref 6–20)
CO2: 26 mmol/L (ref 22–32)
Calcium: 8.9 mg/dL (ref 8.9–10.3)
Chloride: 102 mmol/L (ref 98–111)
Creatinine, Ser: 1.29 mg/dL — ABNORMAL HIGH (ref 0.61–1.24)
GFR, Estimated: >60 mL/min (ref >60)
Glucose, Bld: 87 mg/dL (ref 70–99)
Potassium: 4.1 mmol/L (ref 3.5–5.1)
Sodium: 139 mmol/L (ref 135–145)
Total Bilirubin: 0.5 mg/dL (ref 0.3–1.2)
Total Protein: 7.1 g/dL (ref 6.5–8.1)

## 2022-01-20 LAB — URIC ACID: Uric Acid, Serum: 9.8 mg/dL — ABNORMAL HIGH (ref 3.7–8.6)

## 2022-01-20 LAB — LACTATE DEHYDROGENASE: LDH: 238 U/L — ABNORMAL HIGH (ref 98–192)

## 2022-01-20 NOTE — Assessment & Plan Note (Addendum)
#  CLL-CD38 positive stage III [retroperitoneal lymph nodes]-Asymptomatic. IGVH- UNMUTATED; ? FISH trisomy 12; re-ordered FISH panel. SEP 2023- CT neck-bilateral upper centimeters/lymph nodes noted; CT CAP-Mild lymphadenopathy identified in both axillary regions, retroesophageal space, retroperitoneum, bilateral pelvic sidewalls in both groin regions;  Hepatosplenomegaly.  Today white count is 1 17,000 hemoglobin 11.8 platelets-221.  Patient continues to be asymptomatic clinically-except for mild night sweats episodes.  Hemoglobin is 11.8. /CKD.  Treatment will be recommended only if patient is symptomatic. Symptoms triggering treatment would be-profuse night sweats; weight loss; drop in his hemoglobin/platelets; frequent infections; significant lymphadenopathy etc. discussed with the patient that since his doubling time is 6 months-he might need treatment sooner.  But does not merit any treatment at this time.  Discussed the treatment options include antibody therapy/fusions [Gazya x 6 months] Vs- BTK inhibitors/pills [x one a day pill; indefinitely].   # Mild anemia-hemoglobin 11.7-CLL versus CKD.  Check iron studies.  #History of urinary retention /prostatomegaly /prostate cancer  [Dr.Brandon]incidental-  2.1 x 0.9 cm stone or calcified mucosal lesion posterior left bladder wall in the region of the left UVJ.  Dr.Brandon. stable.  # CKD stage III-stable.  # MS clinically stable.  # DISPOSITION: # follow up in 3 months- MD; labs- cbc/cmp;quantitative immunoglobulin; LDH; CT CAP- Dr.B

## 2022-01-20 NOTE — Progress Notes (Signed)
Patient had procedure TURP and Excision of bladder foreign body by urologist on 12/16/21.

## 2022-01-20 NOTE — Progress Notes (Signed)
[place] NOTE  Patient Care Team: [place] as PCP - General Rogue Bussing, Elisha Headland, MD as Consulting Physician (Oncology)  CHIEF COMPLAINTS/PURPOSE OF CONSULTATION: CLL  Oncology History Overview Note  Chronic lymphocytic leukemia (CLL), positive for CD20, CD22, CD19 and  CD38,  see comment.   ANNOTATION COMMENT IMP Comment VC   Comment: (NOTE)  Expression of CD38 on >30% of the clonal B-cells is reported to be an  unfavorable prognostic factor in CLL.     # CLL stage I- [lymphocytosis/retroperitoneal lymphadenopathy up to 2.5 cm-incidental CT protocol- 2023]  #GEN 2023-bilateral severe hydronephrosis-bladder outlet obstruction/status post catheter [Dr.Brandon]  #History of prostate cancer under surveillance [[facility]]   CLL (chronic lymphocytic leukemia) ([place])  02/11/2021 Initial Diagnosis   CLL (chronic lymphocytic leukemia) (HCC)     HISTORY OF PRESENTING ILLNESS: Alone.  Ambulating independently.  Ralph [name] 57 y.o.  male history of multiple sclerosis-not on active therapy; prostate cancer on surveillance; CLL currently on surveillance is here for a follow-up.  Patient had procedure TURP and Excision of bladder foreign body by urologist on 12/16/21.  Appetite is good. However notes to have weight loss. States eating differently ["cleaner food"].  Patient admits to episode of intermittent sweats.  However they are not drenching.   Denies any lumps or bumps.  Denies any fevers or chills.  No recent infections.  Review of Systems  Constitutional:  Positive for weight loss. Negative for chills, diaphoresis, fever and malaise/fatigue.  HENT:  Negative for nosebleeds and sore throat.   Eyes:  Negative for double vision.  Respiratory:  Negative for cough, hemoptysis, sputum production, shortness of breath and wheezing.   Cardiovascular:  Negative for chest pain, palpitations, orthopnea and leg swelling.  Gastrointestinal:  Negative for  abdominal pain, blood in stool, constipation, diarrhea, heartburn, melena, nausea and vomiting.  Genitourinary:  Negative for dysuria, frequency and urgency.  Musculoskeletal:  Negative for back pain and joint pain.  Skin: Negative.  Negative for itching and rash.  Neurological:  Negative for dizziness, tingling, focal weakness, weakness and headaches.  Endo/Heme/Allergies:  Does not bruise/bleed easily.  Psychiatric/Behavioral:  Negative for depression. The patient is not nervous/anxious and does not have insomnia.      MEDICAL HISTORY:  Past Medical History:  Diagnosis Date   Acute renal failure (HCC)    Anxiety    Bladder outlet obstruction    CLL (chronic lymphocytic leukemia) (HCC)    Elevated PSA    Foreign body of bladder    MS (multiple sclerosis) ([place])    Pre-diabetes    Prostate cancer ([place]) 2013   UTI (urinary tract infection)     SURGICAL HISTORY: Past Surgical History:  Procedure Laterality Date   CYSTOSCOPY N/A 12/16/2021   Procedure: CYSTOSCOPY WITH REMOVAL OF FOREIGN BODY;  Surgeon: Hollice Espy, MD;  Location: [facility] ORS;  Service: Urology;  Laterality: N/A;   HOLEP-LASER ENUCLEATION OF THE PROSTATE WITH MORCELLATION N/A 03/11/2021   Procedure: HOLEP-LASER ENUCLEATION OF THE PROSTATE WITH MORCELLATION;  Surgeon: Hollice Espy, MD;  Location: [facility] ORS;  Service: Urology;  Laterality: N/A;   KNEE SURGERY  2002   meniscus   TRANSURETHRAL RESECTION OF PROSTATE N/A 12/16/2021   Procedure: TRANSURETHRAL RESECTION OF THE PROSTATE (TURP);  Surgeon: Hollice Espy, MD;  Location: [facility] ORS;  Service: Urology;  Laterality: N/A;    SOCIAL HISTORY: Social History   Socioeconomic History   Marital status: Single    Spouse name: Not on file  Number of children: 0   Years of education: College   Highest education level: Not on file  Occupational History   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)  Tobacco Use   Smoking status: Never    Passive exposure: Never    Smokeless tobacco: Never  Vaping Use   Vaping Use: Never used  Substance and Sexual Activity   Alcohol use: No    Comment: Former alcohol consumption   Drug use: No   Sexual activity: Not Currently  Other Topics Concern   Not on file  Social History Narrative   Lives alone   Caffeine use: Diet coke/pepsi/Mt Dew   Right handed       Denies history of smoking.  Denies any alcohol; lives in [place].    Social Determinants of Health   Financial Resource Strain: Not on file  Food Insecurity: Not on file  Transportation Needs: Not on file  Physical Activity: Not on file  Stress: Not on file  Social Connections: Not on file  Intimate Partner Violence: Not on file    FAMILY HISTORY: Family History  Problem Relation Age of Onset   Cancer Sister        breast CA     ALLERGIES:  has No Known Allergies.  MEDICATIONS:  Current Outpatient Medications  Medication Sig Dispense Refill   levofloxacin (LEVAQUIN) 500 MG tablet Take 1 tablet (500 mg total) by mouth daily. 10 tablet 0   Multiple Vitamin (MULTIVITAMIN) tablet Take 1 tablet by mouth daily.     vitamin C (ASCORBIC ACID) 250 MG tablet Take 500 mg by mouth daily.     No current facility-administered medications for this visit.      [name]  PHYSICAL EXAMINATION:  Vitals:   01/20/22 1500  BP: 114/78  Pulse: 85  Resp: 16  Temp: 97.7 F (36.5 C)   Filed Weights   01/20/22 1500  Weight: 209 lb 14.4 oz (95.2 kg)   Foley catheter in place. Physical Exam Vitals and nursing note reviewed.  HENT:     Head: Normocephalic and atraumatic.     Mouth/Throat:     Pharynx: Oropharynx is clear.  Eyes:     Extraocular Movements: Extraocular movements intact.     Pupils: Pupils are equal, round, and reactive to light.  Cardiovascular:     Rate and Rhythm: Normal rate and regular rhythm.  Pulmonary:     Comments: Decreased breath sounds bilaterally.  Abdominal:     Palpations: Abdomen is soft.  Musculoskeletal:         General: Normal range of motion.     Cervical back: Normal range of motion.  Skin:    General: Skin is warm.  Neurological:     General: No focal deficit present.     Mental Status: He is alert and oriented to person, place, and time.  Psychiatric:        Behavior: Behavior normal.        Judgment: Judgment normal.     LABORATORY DATA:  I have reviewed the data as listed Lab Results  Component Value Date   WBC 117.2 (HH) 01/20/2022   HGB 11.7 (L) 01/20/2022   HCT 38.8 (L) 01/20/2022   MCV 89.8 01/20/2022   PLT 221 01/20/2022   Recent Labs    08/16/21 1400 09/19/21 1044 01/20/22 1528  NA 141 140 139  K 4.2 4.3 4.1  CL 103 108 102  CO2 '26 26 26  '$ GLUCOSE 93 103* 87  BUN 19 21*  24*  CREATININE 1.17 1.13 1.29*  CALCIUM 9.4 9.3 8.9  GFRNONAA >60 >60 >60  PROT 7.5 7.5 7.1  ALBUMIN 4.5 4.4 4.0  AST '27 24 19  '$ ALT '26 23 16  '$ ALKPHOS 91 90 96  BILITOT 0.9 0.9 0.5    RADIOGRAPHIC STUDIES: I have personally reviewed the radiological images as listed and agreed with the findings in the report. No results found.  CLL (chronic lymphocytic leukemia) (HCC) #CLL-CD38 positive stage III [retroperitoneal lymph nodes]-Asymptomatic. IGVH- UNMUTATED; ? FISH trisomy 12; re-ordered FISH panel. SEP 2023- CT neck-bilateral upper centimeters/lymph nodes noted; CT CAP-Mild lymphadenopathy identified in both axillary regions, retroesophageal space, retroperitoneum, bilateral pelvic sidewalls in both groin regions;  Hepatosplenomegaly.  Patient continues to be asymptomatic clinically-except for mild night sweats episodes.  Hemoglobin is 11.8. /CKD.  Treatment will be recommended only if patient is symptomatic. Symptoms triggering treatment would be-profuse night sweats; weight loss; drop in his hemoglobin/platelets; frequent infections; significant lymphadenopathy etc. discussed with the patient that since his doubling time is 6 months-he might need treatment sooner.  But does not merit any  treatment at this time.  Discussed the treatment options include antibody therapy/fusions [Gazya x 6 months] Vs- BTK inhibitors/pills [x one a day pill; indefinitely].   # Mild anemia-hemoglobin 11.7-CLL versus CKD.  Check iron studies.  #History of urinary retention /prostatomegaly /prostate cancer  [Dr.Brandon]incidental-  2.1 x 0.9 cm stone or calcified mucosal lesion posterior left bladder wall in the region of the left UVJ.  Dr.Brandon. stable.  # CKD stage III-stable.  # MS clinically stable.  # DISPOSITION: # follow up in 3 months- MD; labs- cbc/cmp;quantitative immunoglobulin; LDH; CT CAP- Dr.B    All questions were answered. The patient knows to call the clinic with any problems, questions or concerns.       Cammie Sickle, MD 01/26/2022 4:43 PM

## 2022-01-27 DIAGNOSIS — E790 Hyperuricemia without signs of inflammatory arthritis and tophaceous disease: Secondary | ICD-10-CM | POA: Diagnosis not present

## 2022-01-28 ENCOUNTER — Ambulatory Visit: Payer: Federal, State, Local not specified - PPO | Admitting: Urology

## 2022-01-28 ENCOUNTER — Encounter: Payer: Self-pay | Admitting: Urology

## 2022-01-28 VITALS — BP 114/74 | HR 92 | Ht 70.0 in | Wt 210.0 lb

## 2022-01-28 DIAGNOSIS — C61 Malignant neoplasm of prostate: Secondary | ICD-10-CM

## 2022-01-28 DIAGNOSIS — N390 Urinary tract infection, site not specified: Secondary | ICD-10-CM

## 2022-01-28 DIAGNOSIS — N401 Enlarged prostate with lower urinary tract symptoms: Secondary | ICD-10-CM

## 2022-01-28 DIAGNOSIS — N453 Epididymo-orchitis: Secondary | ICD-10-CM

## 2022-01-28 DIAGNOSIS — N138 Other obstructive and reflux uropathy: Secondary | ICD-10-CM | POA: Diagnosis not present

## 2022-01-28 DIAGNOSIS — C9110 Chronic lymphocytic leukemia of B-cell type not having achieved remission: Secondary | ICD-10-CM

## 2022-01-28 DIAGNOSIS — Z87440 Personal history of urinary (tract) infections: Secondary | ICD-10-CM

## 2022-01-28 DIAGNOSIS — R339 Retention of urine, unspecified: Secondary | ICD-10-CM

## 2022-01-28 LAB — BLADDER SCAN AMB NON-IMAGING: Scan Result: 111

## 2022-01-28 NOTE — Progress Notes (Signed)
I, DeAsia L Maxie,acting as a scribe for Hollice Espy, MD.,have documented all relevant documentation on the behalf of Hollice Espy, MD,as directed by  Hollice Espy, MD while in the presence of Hollice Espy, MD.   I, Amy L Pierron,acting as a scribe for Hollice Espy, MD.,have documented all relevant documentation on the behalf of Hollice Espy, MD,as directed by  Hollice Espy, MD while in the presence of Hollice Espy, MD.   01/28/22 10:58 AM   Ralph [name] April 12, 1965 [id number]  Referring provider: [place],  [place] [id number]  Chief Complaint  Patient presents with   Benign Prostatic Hypertrophy    HPI: 57 year-old male with a personal history of low risk prostate cancer on active surveillance, BPH with history of urinary retention s/p Holep.   He most recently returned to the operating room for removal of foreign body and TURP on 12/16/21. Intraoperatively he was noted to have necrotic piece of tissue residual from his previous Holep procedure. Additional prostate tissue was also resected. Surgical pathology was consistent with CLL and benign prostate tissue.  He recently finished a course of antibiotics 2 days ago for scrotal pain/epididymoorchitis. He feels his symptoms may be improving but he continues to have some tenderness in the scrotum. He notes he developed constipation from the antbiotics.   He also has a personal history of leukemia, managed by hematology/ oncology.   Results for orders placed or performed in visit on 01/28/22  Bladder Scan (Post Void Residual) in office  Result Value Ref Range   Scan Result 111      IPSS     Row Name 01/28/22 0800         International Prostate Symptom Score   How often have you had the sensation of not emptying your bladder? Less than 1 in 5     How often have you had to urinate less than every two hours? Less than half the time     How often have you found you stopped  and started again several times when you urinated? Less than half the time     How often have you found it difficult to postpone urination? Less than 1 in 5 times     How often have you had a weak urinary stream? Less than 1 in 5 times     How often have you had to strain to start urination? Less than half the time     How many times did you typically get up at night to urinate? 2 Times     Total IPSS Score 11       Quality of Life due to urinary symptoms   If you were to spend the rest of your life with your urinary condition just the way it is now how would you feel about that? Pleased               PMH: Past Medical History:  Diagnosis Date   Acute renal failure (HCC)    Anxiety    Bladder outlet obstruction    CLL (chronic lymphocytic leukemia) (HCC)    Elevated PSA    Foreign body of bladder    MS (multiple sclerosis) ([place])    Pre-diabetes    Prostate cancer ([place]) 2013   UTI (urinary tract infection)     Surgical History: Past Surgical History:  Procedure Laterality Date   CYSTOSCOPY N/A 12/16/2021   Procedure: CYSTOSCOPY WITH REMOVAL OF FOREIGN BODY;  Surgeon: Hollice Espy, MD;  Location: [facility];  Service: Urology;  Laterality: N/A;   HOLEP-LASER ENUCLEATION OF THE PROSTATE WITH MORCELLATION N/A 03/11/2021   Procedure: HOLEP-LASER ENUCLEATION OF THE PROSTATE WITH MORCELLATION;  Surgeon: Hollice Espy, MD;  Location: [facility];  Service: Urology;  Laterality: N/A;   KNEE SURGERY  2002   meniscus   TRANSURETHRAL RESECTION OF PROSTATE N/A 12/16/2021   Procedure: TRANSURETHRAL RESECTION OF THE PROSTATE (TURP);  Surgeon: Hollice Espy, MD;  Location: [facility] OR[facility];  Service: Urology;  Laterality: N/A;    Family History: Family History  Problem Relation Age of Onset   Cancer Sister        breast CA     Social History:  reports that he has never smoked. He has never been exposed to tobacco smoke. He has never used smokeless tobacco. He reports that he does not  drink alcohol and does not use drugs.   Physical Exam: BP 114/74   Pulse 92   Ht '5\' 10"'$  (1.778 m)   Wt 210 lb (95.3 kg)   BMI 30.13 kg/m   Constitutional:  Alert and oriented, No acute distress. HEENT: [place] AT, moist mucus membranes.  Trachea midline, no masses. GU: Normal phallus. Bilateral descended testicles, very subtle tenderness on right, but no erythema, edema, or enlargement. Neurologic: Grossly intact, no focal deficits, moving all 4 extremities. Psychiatric: Normal mood and affect.  Assessment & Plan:    BPH with incomplete bladder emptying - Status post redo TURP with fairly significant regrowth.  Symptomatically he is doing very well.  -Post-operative course was complicated by epididymitis. Very subtle tenderness on exam today, but overall improving. No role for additional antibiotics but advised to let us know if he does not continue to improve or worsens. -PVR is borderline, continue to follow this  2. CLL - Managed by cancer center. Interestingly found in surgical pathology x2 - defer to Dr. Rogue Bussing  3. Prostate Cancer - Incidental low risk in the remote past. We'll check a PSA in 6 months.   4. Recurrent UTI/Epididymoorchitis - Asymptomatic today. Likely related to retained necrotic prostate tissue. - We'll recheck urine in 6 months.   Return in about 6 months (around 07/29/2022) for PSA, UA, IPSS, PVR.  [place] [address] [place],  [address] [phone]

## 2022-02-04 DIAGNOSIS — E782 Mixed hyperlipidemia: Secondary | ICD-10-CM | POA: Diagnosis not present

## 2022-02-21 ENCOUNTER — Ambulatory Visit: Payer: Federal, State, Local not specified - PPO | Admitting: Urology

## 2022-03-10 ENCOUNTER — Ambulatory Visit: Payer: Federal, State, Local not specified - PPO

## 2022-03-10 DIAGNOSIS — K5730 Diverticulosis of large intestine without perforation or abscess without bleeding: Secondary | ICD-10-CM | POA: Diagnosis not present

## 2022-03-10 DIAGNOSIS — K640 First degree hemorrhoids: Secondary | ICD-10-CM | POA: Diagnosis not present

## 2022-03-10 DIAGNOSIS — Z1211 Encounter for screening for malignant neoplasm of colon: Secondary | ICD-10-CM | POA: Diagnosis not present

## 2022-04-18 ENCOUNTER — Ambulatory Visit
Admission: RE | Admit: 2022-04-18 | Discharge: 2022-04-18 | Disposition: A | Discharge status: Home / Self Care | Payer: Federal, State, Local not specified - PPO | Source: Ambulatory Visit | Attending: Internal Medicine | Admitting: Internal Medicine

## 2022-04-18 DIAGNOSIS — N200 Calculus of kidney: Secondary | ICD-10-CM | POA: Diagnosis not present

## 2022-04-18 DIAGNOSIS — N3289 Other specified disorders of bladder: Secondary | ICD-10-CM | POA: Diagnosis not present

## 2022-04-18 DIAGNOSIS — C969 Malignant neoplasm of lymphoid, hematopoietic and related tissue, unspecified: Secondary | ICD-10-CM | POA: Diagnosis not present

## 2022-04-18 DIAGNOSIS — C9110 Chronic lymphocytic leukemia of B-cell type not having achieved remission: Secondary | ICD-10-CM | POA: Diagnosis not present

## 2022-04-24 ENCOUNTER — Inpatient Hospital Stay: Payer: Federal, State, Local not specified - PPO | Attending: Internal Medicine

## 2022-04-24 ENCOUNTER — Encounter: Payer: Self-pay | Admitting: Internal Medicine

## 2022-04-24 ENCOUNTER — Inpatient Hospital Stay (HOSPITAL_BASED_OUTPATIENT_CLINIC_OR_DEPARTMENT_OTHER): Payer: Federal, State, Local not specified - PPO | Admitting: Internal Medicine

## 2022-04-24 VITALS — BP 117/76 | HR 81 | Temp 98.0°F | Ht 72.0 in | Wt 206.0 lb

## 2022-04-24 DIAGNOSIS — Z9079 Acquired absence of other genital organ(s): Secondary | ICD-10-CM | POA: Insufficient documentation

## 2022-04-24 DIAGNOSIS — Z79899 Other long term (current) drug therapy: Secondary | ICD-10-CM | POA: Diagnosis not present

## 2022-04-24 DIAGNOSIS — N183 Chronic kidney disease, stage 3 unspecified: Secondary | ICD-10-CM | POA: Insufficient documentation

## 2022-04-24 DIAGNOSIS — Z8546 Personal history of malignant neoplasm of prostate: Secondary | ICD-10-CM | POA: Insufficient documentation

## 2022-04-24 DIAGNOSIS — C9110 Chronic lymphocytic leukemia of B-cell type not having achieved remission: Secondary | ICD-10-CM

## 2022-04-24 DIAGNOSIS — G35 Multiple sclerosis: Secondary | ICD-10-CM | POA: Insufficient documentation

## 2022-04-24 LAB — CBC WITH DIFFERENTIAL/PLATELET
Abs Immature Granulocytes: 5.9 10*3/uL — ABNORMAL HIGH (ref 0.00–0.07)
Basophils Absolute: 0 10*3/uL (ref 0.0–0.1)
Basophils Relative: 0 %
Blasts: 2 %
Eosinophils Absolute: 0 10*3/uL (ref 0.0–0.5)
Eosinophils Relative: 0 %
HCT: 36.9 % — ABNORMAL LOW (ref 39.0–52.0)
Hemoglobin: 11.3 g/dL — ABNORMAL LOW (ref 13.0–17.0)
Lymphocytes Relative: 90 %
Lymphs Abs: 131.9 10*3/uL — ABNORMAL HIGH (ref 0.7–4.0)
MCH: 27.4 pg (ref 26.0–34.0)
MCHC: 30.6 g/dL (ref 30.0–36.0)
MCV: 89.6 fL (ref 80.0–100.0)
Metamyelocytes Relative: 1 %
Monocytes Absolute: 2.9 10*3/uL — ABNORMAL HIGH (ref 0.1–1.0)
Monocytes Relative: 2 %
Myelocytes: 3 %
Neutro Abs: 2.9 10*3/uL (ref 1.7–7.7)
Neutrophils Relative %: 2 %
Platelets: 169 10*3/uL (ref 150–400)
RBC: 4.12 MIL/uL — ABNORMAL LOW (ref 4.22–5.81)
RDW: 17.7 % — ABNORMAL HIGH (ref 11.5–15.5)
Smear Review: NORMAL
WBC: 146.5 10*3/uL (ref 4.0–10.5)
nRBC: 0 % (ref 0.0–0.2)

## 2022-04-24 LAB — LACTATE DEHYDROGENASE: LDH: 348 U/L — ABNORMAL HIGH (ref 98–192)

## 2022-04-24 LAB — COMPREHENSIVE METABOLIC PANEL
ALT: 25 U/L (ref 0–44)
AST: 32 U/L (ref 15–41)
Albumin: 4.2 g/dL (ref 3.5–5.0)
Alkaline Phosphatase: 125 U/L (ref 38–126)
Anion gap: 9 (ref 5–15)
BUN: 15 mg/dL (ref 6–20)
CO2: 24 mmol/L (ref 22–32)
Calcium: 8.8 mg/dL — ABNORMAL LOW (ref 8.9–10.3)
Chloride: 109 mmol/L (ref 98–111)
Creatinine, Ser: 1.15 mg/dL (ref 0.61–1.24)
GFR, Estimated: >60 mL/min (ref >60)
Glucose, Bld: 98 mg/dL (ref 70–99)
Potassium: 4.1 mmol/L (ref 3.5–5.1)
Sodium: 142 mmol/L (ref 135–145)
Total Bilirubin: 0.9 mg/dL (ref 0.3–1.2)
Total Protein: 7.1 g/dL (ref 6.5–8.1)

## 2022-04-24 LAB — PATHOLOGIST SMEAR REVIEW

## 2022-04-24 NOTE — Progress Notes (Signed)
Critical WBC by Chase in lab  146.5 with read back . Dr Donneta Romberg notified.

## 2022-04-24 NOTE — Progress Notes (Signed)
No concerns for the provider today. 

## 2022-04-24 NOTE — Progress Notes (Signed)
[facility] CONSULT NOTE  Patient Care Team: [name], MD as PCP - General (Family Medicine) Earna Coder, MD as Consulting Physician (Oncology)  CHIEF COMPLAINTS/PURPOSE OF CONSULTATION: CLL  Oncology History Overview Note  Chronic lymphocytic leukemia (CLL), positive for CD20, CD22, CD19 and  CD38,  see comment.   ANNOTATION COMMENT IMP Comment VC   Comment: (NOTE)  Expression of CD38 on >30% of the clonal B-cells is reported to be an  unfavorable prognostic factor in CLL.     # CLL stage I- [lymphocytosis/retroperitoneal lymphadenopathy up to 2.5 cm-incidental CT protocol- 2023]  #GEN 2023-bilateral severe hydronephrosis-bladder outlet obstruction/status post catheter [Dr.Brandon]  #History of prostate cancer under surveillance [[facility]]   CLL (chronic lymphocytic leukemia)  02/11/2021 Initial Diagnosis   CLL (chronic lymphocytic leukemia) (HCC)    HISTORY OF PRESENTING ILLNESS: Alone.  Ambulating independently.  Ralph [name] 57 y.o.  male history of multiple sclerosis-not on active therapy; prostate cancer on surveillance; CLL currently on surveillance is here for a follow-up. And review the results of the CT scan.   Appetite is good. However notes to have weight loss. States eating differently ["cleaner food"].  Patient admits to episode of intermittent sweats.  However they are not drenching.   Denies any lumps or bumps.  Denies any fevers or chills.  No recent infections.  Review of Systems  Constitutional:  Positive for weight loss. Negative for chills, diaphoresis, fever and malaise/fatigue.  HENT:  Negative for nosebleeds and sore throat.   Eyes:  Negative for double vision.  Respiratory:  Negative for cough, hemoptysis, sputum production, shortness of breath and wheezing.   Cardiovascular:  Negative for chest pain, palpitations, orthopnea and leg swelling.  Gastrointestinal:  Negative for abdominal pain, blood in stool,  constipation, diarrhea, heartburn, melena, nausea and vomiting.  Genitourinary:  Negative for dysuria, frequency and urgency.  Musculoskeletal:  Negative for back pain and joint pain.  Skin: Negative.  Negative for itching and rash.  Neurological:  Negative for dizziness, tingling, focal weakness, weakness and headaches.  Endo/Heme/Allergies:  Does not bruise/bleed easily.  Psychiatric/Behavioral:  Negative for depression. The patient is not nervous/anxious and does not have insomnia.      MEDICAL HISTORY:  Past Medical History:  Diagnosis Date   Acute renal failure    Anxiety    Bladder outlet obstruction    CLL (chronic lymphocytic leukemia)    Elevated PSA    Foreign body of bladder    MS (multiple sclerosis)    Pre-diabetes    Prostate cancer 2013   UTI (urinary tract infection)     SURGICAL HISTORY: Past Surgical History:  Procedure Laterality Date   CYSTOSCOPY N/A 12/16/2021   Procedure: CYSTOSCOPY WITH REMOVAL OF FOREIGN BODY;  Surgeon: Vanna Scotland, MD;  Location: [facility];  Service: Urology;  Laterality: N/A;   HOLEP-LASER ENUCLEATION OF THE PROSTATE WITH MORCELLATION N/A 03/11/2021   Procedure: HOLEP-LASER ENUCLEATION OF THE PROSTATE WITH MORCELLATION;  Surgeon: Vanna Scotland, MD;  Location: [facility];  Service: Urology;  Laterality: N/A;   KNEE SURGERY  2002   meniscus   TRANSURETHRAL RESECTION OF PROSTATE N/A 12/16/2021   Procedure: TRANSURETHRAL RESECTION OF THE PROSTATE (TURP);  Surgeon: Vanna Scotland, MD;  Location: [facility];  Service: Urology;  Laterality: N/A;    SOCIAL HISTORY: Social History   Socioeconomic History   Marital status: Single    Spouse name: Not on file   Number of children: 0   Years of education: [employer]  Highest education level: Not on file  Occupational History   Occupation: Research officer, 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  Denies any alcohol; lives in [place].    Social Determinants of Health   Financial Resource Strain: Not on file  Food Insecurity: Not on file  Transportation Needs: Not on file  Physical Activity: Not on file  Stress: Not on file  Social Connections: Not on file  Intimate Partner Violence: Not on file    FAMILY HISTORY: Family History  Problem Relation Age of Onset   Cancer Sister        breast CA     ALLERGIES:  has No Known Allergies.  MEDICATIONS:  Current Outpatient Medications  Medication Sig Dispense Refill   allopurinol (ZYLOPRIM) 100 MG tablet Take 100 mg by mouth daily.     Multiple Vitamin (MULTIVITAMIN) tablet Take 1 tablet by mouth daily.     Na Sulfate-K Sulfate-Mg Sulf 17.5-3.13-1.6 GM/177ML SOLN SMARTSIG:2 By Mouth As Directed     rosuvastatin (CRESTOR) 5 MG tablet Take 1 tablet by mouth at bedtime.     No current facility-administered medications for this visit.      [name]  PHYSICAL EXAMINATION:  Vitals:   04/24/22 1043  BP: 117/76  Pulse: 81  Temp: 98 F (36.7 C)  SpO2: 98%   Filed Weights   04/24/22 1043  Weight: 206 lb (93.4 kg)   Shotty bilateral neck adenopathy.  1 to 2 cm lymph node bilateral axilla left more than right.  Mild lymphadenopathy in the right inguinal region. Mild splenomegaly; hepatomegaly.   Physical Exam Vitals and nursing note reviewed.  HENT:     Head: Normocephalic and atraumatic.     Mouth/Throat:     Pharynx: Oropharynx is clear.  Eyes:     Extraocular Movements: Extraocular movements intact.     Pupils: Pupils are equal, round, and reactive to light.  Cardiovascular:     Rate and Rhythm: Normal rate and  regular rhythm.  Pulmonary:     Comments: Decreased breath sounds bilaterally.  Abdominal:     Palpations: Abdomen is soft.  Musculoskeletal:        General: Normal range of motion.     Cervical back: Normal range of motion.  Skin:    General: Skin is warm.  Neurological:     General: No focal deficit present.     Mental Status: He is alert and oriented to person, place, and time.  Psychiatric:        Behavior: Behavior normal.        Judgment: Judgment normal.     LABORATORY DATA:  I have reviewed the data as listed Lab Results  Component Value Date   WBC 146.5 (HH) 04/24/2022   HGB 11.3 (L) 04/24/2022   HCT 36.9 (L) 04/24/2022   MCV 89.6 04/24/2022   PLT 169 04/24/2022   Recent Labs    09/19/21 1044 01/20/22 1528 04/24/22 1029  NA 140 139 142  K 4.3 4.1 4.1  CL 108 102 109  CO2 GLUCOSE 103* 87 98  BUN 21* 24* 15  CREATININE 1.13 1.29* 1.15  CALCIUM 9.3 8.9 8.8*  GFRNONAA >60 >60 >60  PROT 7.5 7.1 7.1  ALBUMIN 4.4 4.0 4.2  AST 24 19 32  ALT ALKPHOS 90 96 125  BILITOT 0.9 0.5 0.9    RADIOGRAPHIC STUDIES: I have personally reviewed the radiological images as listed and agreed with the findings in the report. CT ABDOMEN PELVIS WO CONTRAST  Result Date: 04/21/2022 CLINICAL DATA:  Restaging hematologic malignancy (CLL). * Tracking Code: BO * EXAM: CT CHEST, ABDOMEN AND PELVIS WITHOUT CONTRAST TECHNIQUE: Multidetector CT imaging of the chest, abdomen and pelvis was performed following the standard protocol without IV contrast. RADIATION DOSE REDUCTION: This exam was performed according to the departmental dose-optimization program which includes automated exposure control, adjustment of the mA and/or kV according to patient size and/or use of iterative reconstruction technique. COMPARISON:  Prior imaging studies from 2003. The most recent is 09/16/2021 FINDINGS: CT CHEST FINDINGS Cardiovascular: The heart is normal in size. No pericardial  effusion. The aorta is normal in caliber. No atherosclerotic calcifications. Mediastinum/Nodes: Difficult to measure mediastinal and hilar lymph nodes without contrast. A subcarinal node measures approximately 15 mm and is unchanged. The esophagus is grossly normal. Lungs/Pleura: No worrisome pulmonary lesions or pulmonary nodules. No subpleural or perifissural nodes. No acute pulmonary process. Musculoskeletal: Scattered bilateral supraclavicular and axillary lymph nodes. Left axillary node on image 10/2 measures 15 mm and previously measured 14 mm. A 12 mm node on image 21/2 previously measured 12 mm. 10 mm right axial lymph node on image 21/2 stable. No significant bony findings. CT ABDOMEN PELVIS FINDINGS Hepatobiliary: No hepatic lesions are identified without contrast. No intrahepatic biliary dilatation. The gallbladder is unremarkable. No common bile duct dilatation. Pancreas: No mass, inflammation or ductal dilatation. Spleen: The spleen measures 20 x 16 x 10 cm and has enlarged since the prior CT scan where it measured 17 x 15 x 7 cm no splenic lesions. Adrenals/Urinary Tract: The adrenal glands are unremarkable and stable. Small bilateral renal calculi but no obstructing ureteral calculi. No renal lesions. Moderate symmetric bladder wall thickening possibly due to partial bladder outlet obstruction. Enlarged prostate gland with median lobe hypertrophy impressing on the base of the bladder. Stomach/Bowel: The stomach, duodenum, small bowel and colon are grossly normal. Age advanced sigmoid colon diverticulosis. Vascular/Lymphatic: No aortic aneurysm significant vascular calcifications. Scattered bilateral iliac artery calcifications. Numerous mesenteric and retroperitoneal lymph nodes again demonstrated. Most of these are small. The largest mesenteric node on image 78/2 measures 12 mm and is unchanged. No change in small scattered retroperitoneal nodes. Left pelvic sidewall lymph node on image 110/2  measures 12 mm and previously measured 14 mm. Right-sided pelvic sidewall lymph node measures 14 mm and previously measured 13 mm. Bilateral inguinal lymph nodes are stable. Left-sided node on image 115/2 measures 12 mm and is unchanged. Right-sided lymph node measures 12 mm on image 116/2 and is unchanged. Reproductive: Mildly enlarged prostate gland with median lobe hypertrophy impressing on the base the bladder. The seminal vesicles are unremarkable. Other: No pelvic mass or adenopathy. No free pelvic fluid collections. No inguinal mass or adenopathy. No subcutaneous lesions. Musculoskeletal: Stable largely sclerotic left acetabular lesion likely benign focus of fibrous dysplasia. The upper SI joints are fused bilaterally. IMPRESSION: 1. Stable lymphadenopathy involving the chest, abdomen and pelvis. No new progressive  findings. 2. Subsequent interval enlargement of the spleen. 3. Small bilateral renal calculi but no obstructing ureteral calculi. 4. Moderate symmetric bladder wall thickening possibly due to partial bladder outlet obstruction. Enlarged prostate gland with median lobe hypertrophy impressing on the base of the bladder. 5. Stable largely sclerotic left acetabular lesion likely benign focus of fibrous dysplasia. Electronically Signed   By: Rudie Meyer M.D.   On: 04/21/2022 13:44   CT CHEST WO CONTRAST  Result Date: 04/21/2022 CLINICAL DATA:  Restaging hematologic malignancy (CLL). * Tracking Code: BO * EXAM: CT CHEST, ABDOMEN AND PELVIS WITHOUT CONTRAST TECHNIQUE: Multidetector CT imaging of the chest, abdomen and pelvis was performed following the standard protocol without IV contrast. RADIATION DOSE REDUCTION: This exam was performed according to the departmental dose-optimization program which includes automated exposure control, adjustment of the mA and/or kV according to patient size and/or use of iterative reconstruction technique. COMPARISON:  Prior imaging studies from 2003. The most  recent is 09/16/2021 FINDINGS: CT CHEST FINDINGS Cardiovascular: The heart is normal in size. No pericardial effusion. The aorta is normal in caliber. No atherosclerotic calcifications. Mediastinum/Nodes: Difficult to measure mediastinal and hilar lymph nodes without contrast. A subcarinal node measures approximately 15 mm and is unchanged. The esophagus is grossly normal. Lungs/Pleura: No worrisome pulmonary lesions or pulmonary nodules. No subpleural or perifissural nodes. No acute pulmonary process. Musculoskeletal: Scattered bilateral supraclavicular and axillary lymph nodes. Left axillary node on image 10/2 measures 15 mm and previously measured 14 mm. A 12 mm node on image 21/2 previously measured 12 mm. 10 mm right axial lymph node on image 21/2 stable. No significant bony findings. CT ABDOMEN PELVIS FINDINGS Hepatobiliary: No hepatic lesions are identified without contrast. No intrahepatic biliary dilatation. The gallbladder is unremarkable. No common bile duct dilatation. Pancreas: No mass, inflammation or ductal dilatation. Spleen: The spleen measures 20 x 16 x 10 cm and has enlarged since the prior CT scan where it measured 17 x 15 x 7 cm no splenic lesions. Adrenals/Urinary Tract: The adrenal glands are unremarkable and stable. Small bilateral renal calculi but no obstructing ureteral calculi. No renal lesions. Moderate symmetric bladder wall thickening possibly due to partial bladder outlet obstruction. Enlarged prostate gland with median lobe hypertrophy impressing on the base of the bladder. Stomach/Bowel: The stomach, duodenum, small bowel and colon are grossly normal. Age advanced sigmoid colon diverticulosis. Vascular/Lymphatic: No aortic aneurysm significant vascular calcifications. Scattered bilateral iliac artery calcifications. Numerous mesenteric and retroperitoneal lymph nodes again demonstrated. Most of these are small. The largest mesenteric node on image 78/2 measures 12 mm and is  unchanged. No change in small scattered retroperitoneal nodes. Left pelvic sidewall lymph node on image 110/2 measures 12 mm and previously measured 14 mm. Right-sided pelvic sidewall lymph node measures 14 mm and previously measured 13 mm. Bilateral inguinal lymph nodes are stable. Left-sided node on image 115/2 measures 12 mm and is unchanged. Right-sided lymph node measures 12 mm on image 116/2 and is unchanged. Reproductive: Mildly enlarged prostate gland with median lobe hypertrophy impressing on the base the bladder. The seminal vesicles are unremarkable. Other: No pelvic mass or adenopathy. No free pelvic fluid collections. No inguinal mass or adenopathy. No subcutaneous lesions. Musculoskeletal: Stable largely sclerotic left acetabular lesion likely benign focus of fibrous dysplasia. The upper SI joints are fused bilaterally. IMPRESSION: 1. Stable lymphadenopathy involving the chest, abdomen and pelvis. No new progressive findings. 2. Subsequent interval enlargement of the spleen. 3. Small bilateral renal calculi but no obstructing ureteral calculi.  4. Moderate symmetric bladder wall thickening possibly due to partial bladder outlet obstruction. Enlarged prostate gland with median lobe hypertrophy impressing on the base of the bladder. 5. Stable largely sclerotic left acetabular lesion likely benign focus of fibrous dysplasia. Electronically Signed   By: Rudie Meyer M.D.   On: 04/21/2022 13:44    CLL (chronic lymphocytic leukemia) (HCC) #CLL-CD38 positive stage III [retroperitoneal lymph nodes]-Asymptomatic. I[facility]- UNMUTATED; ? FISH trisomy 12; re-ordered FISH panel.  APRIL 15th, 2024- Stable lymphadenopathy involving the chest, abdomen and pelvis. No new progressive findings;  Subsequent interval enlargement of the spleen.   # Today white count is 146000 hemoglobin 11.8 platelets-169  Patient continues to be asymptomatic clinically-except for mild night sweats episodes.    # Again, treatment will be  recommended only if patient is symptomatic.  Again at length I reviewed symptoms triggering treatment would be-profuse night sweats; weight loss; drop in his hemoglobin/platelets; frequent infections; significant lymphadenopathy etc. discussed with the patient that since his doubling time is 6 months-he might need treatment sooner.  But does not merit any treatment at this time.    # I again reviewed and at length discussed the treatment options include antibody therapy/fusions [Gazya x 6 months] Vs- BTK inhibitors/pills [x one a day pill; indefinitely]; venatoclax.   # Mild anemia-hemoglobin 11.7-CLL versus CKD.  Awaiting iron studies.  #History of urinary retention /prostatomegaly /prostate cancer  [Dr.Brandon]incidental-  2.1 x 0.9 cm stone or calcified mucosal lesion posterior left bladder wall in the region of the left UVJ.  Dr.Brandon.- see below.   # CKD stage III- stable.   # MS clinically stable.  #Incidental findings on Imaging  CT , 2024: Small bilateral renal calculi but no obstructing ureteral calculi. Moderate symmetric bladder wall thickening possibly due to partial bladder outlet obstruction. Enlarged prostate gland with median lobe hypertrophy impressing on the base of the bladder [followed by urology] Stable largely sclerotic left acetabular lesion likely benign focus of fibrous dysplasia.I reviewed/discussed/counseled the patient.   # DISPOSITION: # follow up in 2 months- MD; labs- cbc/cmp;LDH-  Dr.B    All questions were answered. The patient knows to call the clinic with any problems, questions or concerns.       Earna Coder, MD 04/24/2022 11:58 AM

## 2022-04-24 NOTE — Assessment & Plan Note (Signed)
#  CLL-CD38 positive stage III [retroperitoneal lymph nodes]-Asymptomatic. [facility]- UNMUTATED; ? FISH trisomy 12; re-ordered FISH panel.  APRIL 15th, 2024- Stable lymphadenopathy involving the chest, abdomen and pelvis. No new progressive findings;  Subsequent interval enlargement of the spleen.   # Today white count is 146000 hemoglobin 11.8 platelets-169  Patient continues to be asymptomatic clinically-except for mild night sweats episodes.    # Again, treatment will be recommended only if patient is symptomatic.  Again at length I reviewed symptoms triggering treatment would be-profuse night sweats; weight loss; drop in his hemoglobin/platelets; frequent infections; significant lymphadenopathy etc. discussed with the patient that since his doubling time is 6 months-he might need treatment sooner.  But does not merit any treatment at this time.    # I again reviewed and at length discussed the treatment options include antibody therapy/fusions [Gazya x 6 months] Vs- BTK inhibitors/pills [x one a day pill; indefinitely]; venatoclax.   # Mild anemia-hemoglobin 11.7-CLL versus CKD.  Awaiting iron studies.  #History of urinary retention /prostatomegaly /prostate cancer  [Dr.Brandon]incidental-  2.1 x 0.9 cm stone or calcified mucosal lesion posterior left bladder wall in the region of the left UVJ.  Dr.Brandon.- see below.   # CKD stage III- stable.   # MS clinically stable.  #Incidental findings on Imaging  CT , 2024: Small bilateral renal calculi but no obstructing ureteral calculi. Moderate symmetric bladder wall thickening possibly due to partial bladder outlet obstruction. Enlarged prostate gland with median lobe hypertrophy impressing on the base of the bladder [followed by urology] Stable largely sclerotic left acetabular lesion likely benign focus of fibrous dysplasia.I reviewed/discussed/counseled the patient.   # DISPOSITION: # follow up in 2 months- MD; labs- cbc/cmp;LDH-  Dr.B

## 2022-04-29 LAB — MISC LABCORP TEST (SEND OUT): Labcorp test code: 510340

## 2022-04-30 LAB — IMMUNOGLOBULINS A/E/G/M, SERUM
IgA: 84 mg/dL — ABNORMAL LOW (ref 90–386)
IgE (Immunoglobulin E), Serum: 9 IU/mL (ref 6–495)
IgG (Immunoglobin G), Serum: 976 mg/dL (ref 603–1613)
IgM (Immunoglobulin M), Srm: 214 mg/dL — ABNORMAL HIGH (ref 20–172)

## 2022-06-16 DIAGNOSIS — G35 Multiple sclerosis: Secondary | ICD-10-CM | POA: Diagnosis not present

## 2022-06-25 ENCOUNTER — Inpatient Hospital Stay (HOSPITAL_BASED_OUTPATIENT_CLINIC_OR_DEPARTMENT_OTHER): Payer: Federal, State, Local not specified - PPO | Admitting: Internal Medicine

## 2022-06-25 ENCOUNTER — Inpatient Hospital Stay: Payer: Federal, State, Local not specified - PPO | Attending: Internal Medicine

## 2022-06-25 ENCOUNTER — Encounter: Payer: Self-pay | Admitting: Internal Medicine

## 2022-06-25 VITALS — BP 109/67 | HR 71 | Temp 97.8°F | Ht 72.0 in | Wt 202.2 lb

## 2022-06-25 DIAGNOSIS — Z8546 Personal history of malignant neoplasm of prostate: Secondary | ICD-10-CM | POA: Diagnosis not present

## 2022-06-25 DIAGNOSIS — C9110 Chronic lymphocytic leukemia of B-cell type not having achieved remission: Secondary | ICD-10-CM

## 2022-06-25 DIAGNOSIS — Z9079 Acquired absence of other genital organ(s): Secondary | ICD-10-CM | POA: Diagnosis not present

## 2022-06-25 DIAGNOSIS — G35 Multiple sclerosis: Secondary | ICD-10-CM | POA: Diagnosis not present

## 2022-06-25 DIAGNOSIS — N183 Chronic kidney disease, stage 3 unspecified: Secondary | ICD-10-CM | POA: Insufficient documentation

## 2022-06-25 DIAGNOSIS — Z79899 Other long term (current) drug therapy: Secondary | ICD-10-CM | POA: Insufficient documentation

## 2022-06-25 LAB — LACTATE DEHYDROGENASE: LDH: 363 U/L — ABNORMAL HIGH (ref 98–192)

## 2022-06-25 LAB — CBC WITH DIFFERENTIAL (CANCER CENTER ONLY)
Abs Immature Granulocytes: 0.53 10*3/uL — ABNORMAL HIGH (ref 0.00–0.07)
Basophils Absolute: 0.1 10*3/uL (ref 0.0–0.1)
Basophils Relative: 0 %
Eosinophils Absolute: 0.2 10*3/uL (ref 0.0–0.5)
Eosinophils Relative: 0 %
HCT: 33.3 % — ABNORMAL LOW (ref 39.0–52.0)
Hemoglobin: 10.3 g/dL — ABNORMAL LOW (ref 13.0–17.0)
Immature Granulocytes: 0 %
Lymphocytes Relative: 82 %
Lymphs Abs: 137.6 10*3/uL — ABNORMAL HIGH (ref 0.7–4.0)
MCH: 28.3 pg (ref 26.0–34.0)
MCHC: 30.9 g/dL (ref 30.0–36.0)
MCV: 91.5 fL (ref 80.0–100.0)
Monocytes Absolute: 26.5 10*3/uL — ABNORMAL HIGH (ref 0.1–1.0)
Monocytes Relative: 16 %
Neutro Abs: 3.4 10*3/uL (ref 1.7–7.7)
Neutrophils Relative %: 2 %
Platelet Count: 140 10*3/uL — ABNORMAL LOW (ref 150–400)
RBC: 3.64 MIL/uL — ABNORMAL LOW (ref 4.22–5.81)
RDW: 18.3 % — ABNORMAL HIGH (ref 11.5–15.5)
Smear Review: NORMAL
WBC Count: 168.4 10*3/uL (ref 4.0–10.5)
nRBC: 0.1 % (ref 0.0–0.2)

## 2022-06-25 LAB — CMP (CANCER CENTER ONLY)
ALT: 19 U/L (ref 0–44)
AST: 26 U/L (ref 15–41)
Albumin: 4.5 g/dL (ref 3.5–5.0)
Alkaline Phosphatase: 112 U/L (ref 38–126)
Anion gap: 9 (ref 5–15)
BUN: 24 mg/dL — ABNORMAL HIGH (ref 6–20)
CO2: 23 mmol/L (ref 22–32)
Calcium: 9 mg/dL (ref 8.9–10.3)
Chloride: 106 mmol/L (ref 98–111)
Creatinine: 1.04 mg/dL (ref 0.61–1.24)
GFR, Estimated: >60 mL/min (ref >60)
Glucose, Bld: 125 mg/dL — ABNORMAL HIGH (ref 70–99)
Potassium: 3.9 mmol/L (ref 3.5–5.1)
Sodium: 138 mmol/L (ref 135–145)
Total Bilirubin: 1.3 mg/dL — ABNORMAL HIGH (ref 0.3–1.2)
Total Protein: 7.1 g/dL (ref 6.5–8.1)

## 2022-06-25 NOTE — Assessment & Plan Note (Addendum)
#  CLL-CD38 positive stage III [retroperitoneal lymph nodes]-Asymptomatic. [facility]- UNMUTATED; ? FISH trisomy 12; re-ordered FISH panel.  APRIL 15th, 2024- Stable lymphadenopathy involving the chest, abdomen and pelvis. No new progressive findings;  Subsequent interval enlargement of the spleen.   # Today white count is 168 000 hemoglobin 10.4  platelets-140-  Patient continues to be asymptomatic clinically-except for mild night sweats episodes.  Again, treatment will be recommended only if patient is significantly symptomatic.  Again at length I reviewed symptoms triggering treatment would be-profuse night sweats; weight loss; drop in his hemoglobin/platelets; frequent infections; significant lymphadenopathy etc. discussed with the patient that since his doubling time is 6 months-he might need treatment sooner.  But does not merit any treatment at this time.    # I again reviewed and at length discussed the treatment options include antibody therapy/fusions [Gazya x 6 months] + BTK inhibitors/pills [x one a day pill; indefinitely] vs Gazyva+ venatoclax.   #History of urinary retention /prostatomegaly /prostate cancer  [Dr.Brandon]incidental-  2.1 x 0.9 cm stone or calcified mucosal lesion posterior left bladder wall in the region of the left UVJ.  Dr.Brandon. stable.   # CKD stage II-III- stable.   # MS clinically stable.  Beach trip in 2 months-  # DISPOSITION: # follow up in 2-3  months- MD; labs- cbc/cmp;LDH-  Dr.B

## 2022-06-25 NOTE — Progress Notes (Signed)
[facility] CONSULT NOTE  Patient Care Team: [name], MD as PCP - General (Family Medicine) Earna Coder, MD as Consulting Physician (Oncology)  CHIEF COMPLAINTS/PURPOSE OF CONSULTATION: CLL  Oncology History Overview Note  Chronic lymphocytic leukemia (CLL), positive for CD20, CD22, CD19 and  CD38,  see comment.   ANNOTATION COMMENT IMP Comment VC   Comment: (NOTE)  Expression of CD38 on >30% of the clonal B-cells is reported to be an  unfavorable prognostic factor in CLL.     # CLL stage I- [lymphocytosis/retroperitoneal lymphadenopathy up to 2.5 cm-incidental CT protocol- 2023];   APRIL 2024- consistent with trisomy 12. Results for  CCND1/IGH, ATM, 13q and TP53 were normal.   #GEN 2023-bilateral severe hydronephrosis-bladder outlet obstruction/status post catheter [Dr.Brandon]  #History of prostate cancer under surveillance [[facility]]   CLL (chronic lymphocytic leukemia) (HCC)  02/11/2021 Initial Diagnosis   CLL (chronic lymphocytic leukemia) (HCC)    HISTORY OF PRESENTING ILLNESS: Alone.  Ambulating independently.  Ralph [name] 57 y.o.  male history of multiple sclerosis-not on active therapy; prostate cancer on surveillance; CLL currently on surveillance is here for a follow-up. And review the results of the CT scan.   Appetite is good. However notes to have weight loss. States eating differently ["cleaner food"].  Patient admits to episode of intermittent sweats.  However they are not drenching.   Denies any lumps or bumps.  Denies any fevers or chills.  No recent infections.  Review of Systems  Constitutional:  Positive for weight loss. Negative for chills, diaphoresis, fever and malaise/fatigue.  HENT:  Negative for nosebleeds and sore throat.   Eyes:  Negative for double vision.  Respiratory:  Negative for cough, hemoptysis, sputum production, shortness of breath and wheezing.   Cardiovascular:  Negative for chest pain,  palpitations, orthopnea and leg swelling.  Gastrointestinal:  Negative for abdominal pain, blood in stool, constipation, diarrhea, heartburn, melena, nausea and vomiting.  Genitourinary:  Negative for dysuria, frequency and urgency.  Musculoskeletal:  Negative for back pain and joint pain.  Skin: Negative.  Negative for itching and rash.  Neurological:  Negative for dizziness, tingling, focal weakness, weakness and headaches.  Endo/Heme/Allergies:  Does not bruise/bleed easily.  Psychiatric/Behavioral:  Negative for depression. The patient is not nervous/anxious and does not have insomnia.      MEDICAL HISTORY:  Past Medical History:  Diagnosis Date   Acute renal failure (HCC)    Anxiety    Bladder outlet obstruction    CLL (chronic lymphocytic leukemia) (HCC)    Elevated PSA    Foreign body of bladder    MS (multiple sclerosis) (HCC)    Pre-diabetes    Prostate cancer (HCC) 2013   UTI (urinary tract infection)     SURGICAL HISTORY: Past Surgical History:  Procedure Laterality Date   CYSTOSCOPY N/A 12/16/2021   Procedure: CYSTOSCOPY WITH REMOVAL OF FOREIGN BODY;  Surgeon: Vanna Scotland, MD;  Location: [facility] ORS;  Service: Urology;  Laterality: N/A;   HOLEP-LASER ENUCLEATION OF THE PROSTATE WITH MORCELLATION N/A 03/11/2021   Procedure: HOLEP-LASER ENUCLEATION OF THE PROSTATE WITH MORCELLATION;  Surgeon: Vanna Scotland, MD;  Location: [facility] ORS;  Service: Urology;  Laterality: N/A;   KNEE SURGERY  2002   meniscus   TRANSURETHRAL RESECTION OF PROSTATE N/A 12/16/2021   Procedure: TRANSURETHRAL RESECTION OF THE PROSTATE (TURP);  Surgeon: Vanna Scotland, MD;  Location: [facility] ORS;  Service: Urology;  Laterality: N/A;    SOCIAL HISTORY: Social History   Socioeconomic History  Marital status: Single    Spouse name: Not on file   Number of children: 0   Years of education: College   Highest education level: Not on file  Occupational History   Occupation: Research officer, political party  [employer])  Tobacco Use   Smoking status: Never    Passive exposure: Never   Smokeless tobacco: Never  Vaping Use   Vaping Use: Never used  Substance and Sexual Activity   Alcohol use: No    Comment: Former alcohol consumption   Drug use: No   Sexual activity: Not Currently  Other Topics Concern   Not on file  Social History Narrative   Lives alone   Caffeine use: Diet coke/pepsi/Mt Dew   Right handed       Denies history of smoking.  Denies any alcohol; lives in [place].    Social Determinants of Health   Financial Resource Strain: Not on file  Food Insecurity: Not on file  Transportation Needs: Not on file  Physical Activity: Not on file  Stress: Not on file  Social Connections: Not on file  Intimate Partner Violence: Not on file    FAMILY HISTORY: Family History  Problem Relation Age of Onset   Cancer Sister        breast CA     ALLERGIES:  has No Known Allergies.  MEDICATIONS:  Current Outpatient Medications  Medication Sig Dispense Refill   allopurinol (ZYLOPRIM) 100 MG tablet Take 100 mg by mouth daily.     Multiple Vitamin (MULTIVITAMIN) tablet Take 1 tablet by mouth daily.     Na Sulfate-K Sulfate-Mg Sulf 17.5-3.13-1.6 GM/177ML SOLN SMARTSIG:2 By Mouth As Directed     rosuvastatin (CRESTOR) 5 MG tablet Take 1 tablet by mouth at bedtime.     No current facility-administered medications for this visit.      [name]  PHYSICAL EXAMINATION:  Vitals:   06/25/22 1021  BP: 109/67  Pulse: 71  Temp: 97.8 F (36.6 C)  SpO2: 100%   Filed Weights   06/25/22 1021  Weight: 202 lb 3.2 oz (91.7 kg)   Shotty bilateral neck adenopathy.  1 to 2 cm lymph node bilateral axilla left more than right.  Mild lymphadenopathy in the right inguinal region. Mild splenomegaly; hepatomegaly.   Physical Exam Vitals and nursing note reviewed.  HENT:     Head: Normocephalic and atraumatic.     Mouth/Throat:     Pharynx: Oropharynx is clear.  Eyes:     Extraocular  Movements: Extraocular movements intact.     Pupils: Pupils are equal, round, and reactive to light.  Cardiovascular:     Rate and Rhythm: Normal rate and regular rhythm.  Pulmonary:     Comments: Decreased breath sounds bilaterally.  Abdominal:     Palpations: Abdomen is soft.  Musculoskeletal:        General: Normal range of motion.     Cervical back: Normal range of motion.  Skin:    General: Skin is warm.  Neurological:     General: No focal deficit present.     Mental Status: He is alert and oriented to person, place, and time.  Psychiatric:        Behavior: Behavior normal.        Judgment: Judgment normal.     LABORATORY DATA:  I have reviewed the data as listed Lab Results  Component Value Date   WBC 168.4 (HH) 06/25/2022   HGB 10.3 (L) 06/25/2022   HCT 33.3 (L) 06/25/2022  MCV 91.5 06/25/2022   PLT 140 (L) 06/25/2022   Recent Labs    01/20/22 1528 04/24/22 1029 06/25/22 1026  NA 139 142 138  K 4.1 4.1 3.9  CL 102 109 106  CO2 26 24 23   GLUCOSE 87 98 125*  BUN 24* 15 24*  CREATININE 1.29* 1.15 1.04  CALCIUM 8.9 8.8* 9.0  GFRNONAA >60 >60 >60  PROT 7.1 7.1 7.1  ALBUMIN 4.0 4.2 4.5  AST 19 32 26  ALT 16 25 19   ALKPHOS 96 125 112  BILITOT 0.5 0.9 1.3*    RADIOGRAPHIC STUDIES: I have personally reviewed the radiological images as listed and agreed with the findings in the report. No results found.  CLL (chronic lymphocytic leukemia) (HCC) #CLL-CD38 positive stage III [retroperitoneal lymph nodes]-Asymptomatic. [facility]- UNMUTATED; ? FISH trisomy 12; re-ordered FISH panel.  APRIL 15th, 2024- Stable lymphadenopathy involving the chest, abdomen and pelvis. No new progressive findings;  Subsequent interval enlargement of the spleen.   # Today white count is 168 000 hemoglobin 10.4  platelets-140-  Patient continues to be asymptomatic clinically-except for mild night sweats episodes.  Again, treatment will be recommended only if patient is significantly  symptomatic.  Again at length I reviewed symptoms triggering treatment would be-profuse night sweats; weight loss; drop in his hemoglobin/platelets; frequent infections; significant lymphadenopathy etc. discussed with the patient that since his doubling time is 6 months-he might need treatment sooner.  But does not merit any treatment at this time.    # I again reviewed and at length discussed the treatment options include antibody therapy/fusions [Gazya x 6 months] + BTK inhibitors/pills [x one a day pill; indefinitely] vs Gazyva+ venatoclax.   #History of urinary retention /prostatomegaly /prostate cancer  [Dr.Brandon]incidental-  2.1 x 0.9 cm stone or calcified mucosal lesion posterior left bladder wall in the region of the left UVJ.  Dr.Brandon. stable.   # CKD stage II-III- stable.   # MS clinically stable.  Beach trip in 2 months-  # DISPOSITION: # follow up in 2-3  months- MD; labs- cbc/cmp;LDH-  Dr.B    All questions were answered. The patient knows to call the clinic with any problems, questions or concerns.       Earna Coder, MD 06/25/2022 11:31 AM

## 2022-06-25 NOTE — Progress Notes (Signed)
Received call from [facility] in Lab. Critical WBC 168.4. Read back. MD notified, Dr Donneta Romberg.

## 2022-06-25 NOTE — Progress Notes (Signed)
No concerns today 

## 2022-06-26 DIAGNOSIS — H43811 Vitreous degeneration, right eye: Secondary | ICD-10-CM | POA: Diagnosis not present

## 2022-07-25 DIAGNOSIS — H43811 Vitreous degeneration, right eye: Secondary | ICD-10-CM | POA: Diagnosis not present

## 2022-07-29 ENCOUNTER — Other Ambulatory Visit: Payer: Federal, State, Local not specified - PPO

## 2022-07-29 DIAGNOSIS — N401 Enlarged prostate with lower urinary tract symptoms: Secondary | ICD-10-CM | POA: Diagnosis not present

## 2022-07-29 DIAGNOSIS — N138 Other obstructive and reflux uropathy: Secondary | ICD-10-CM | POA: Diagnosis not present

## 2022-07-30 LAB — PSA: Prostate Specific Ag, Serum: 0.7 ng/mL (ref 0.0–4.0)

## 2022-08-05 ENCOUNTER — Encounter: Payer: Self-pay | Admitting: Urology

## 2022-08-05 ENCOUNTER — Ambulatory Visit: Payer: Federal, State, Local not specified - PPO | Admitting: Urology

## 2022-08-05 VITALS — BP 108/67 | HR 77 | Wt 212.0 lb

## 2022-08-05 DIAGNOSIS — C9110 Chronic lymphocytic leukemia of B-cell type not having achieved remission: Secondary | ICD-10-CM | POA: Diagnosis not present

## 2022-08-05 DIAGNOSIS — C61 Malignant neoplasm of prostate: Secondary | ICD-10-CM

## 2022-08-05 DIAGNOSIS — N401 Enlarged prostate with lower urinary tract symptoms: Secondary | ICD-10-CM

## 2022-08-05 DIAGNOSIS — N138 Other obstructive and reflux uropathy: Secondary | ICD-10-CM

## 2022-08-05 LAB — URINALYSIS, COMPLETE
Bilirubin, UA: NEGATIVE
Glucose, UA: NEGATIVE
Ketones, UA: NEGATIVE
Leukocytes,UA: NEGATIVE
Nitrite, UA: NEGATIVE
RBC, UA: NEGATIVE
Specific Gravity, UA: 1.02 (ref 1.005–1.030)
Urobilinogen, Ur: 0.2 mg/dL (ref 0.2–1.0)
pH, UA: 5.5 (ref 5.0–7.5)

## 2022-08-05 LAB — MICROSCOPIC EXAMINATION

## 2022-08-05 LAB — BLADDER SCAN AMB NON-IMAGING

## 2022-08-05 NOTE — Progress Notes (Signed)
uri

## 2022-08-05 NOTE — Progress Notes (Signed)
Marcelle Overlie Plume,acting as a scribe for Vanna Scotland, MD.,have documented all relevant documentation on the behalf of Vanna Scotland, MD,as directed by  Vanna Scotland, MD while in the presence of Vanna Scotland, MD.  08/05/2022 9:06 AM   Ralph [name] 1966/01/06 [id number]  Referring provider: [facility] [address] [place],  [place] [address]  Chief Complaint Patient presents with  Benign Prostatic Hypertrophy   HPI: 57 year-old male with a personal history of BPH with urinary retention, status post HoLEP, low risk prostate cancer, and active surveillance.   He underwent HoLEP in 03/2021 after presenting with massive urinary retention with bilateral hydronephrosis and renal failure. The surgical pathology showed CLL.  He also has a remote history of low risk prostate cancer diagnosed at [facility].  His most recent PSA on 07/29/2022 was 0.7. This is down from his pre-procedure level of 9.86 on 01/22/2021.  Today, he is pleased with his urinary symptoms. He does not appear to be on any BPH medications.   He is also under the care of Dr. Donneta Romberg for his CLL, with regular monitoring every two months due to a slightly enlarging spleen. He reports feeling well overall but acknowledges internal issues related to his CLL.   Results for orders placed or performed in visit on 08/05/22 Microscopic Examination Urine Result Value Ref Range WBC, UA 0-5 0 - 5 /hpf RBC, Urine 0-2 0 - 2 /hpf Epithelial Cells (non renal) 0-10 0 - 10 /hpf Mucus, UA Present (A) Not Estab. Bacteria, UA Few None seen/Few Urinalysis, Complete Result Value Ref Range Specific Gravity, UA 1.020 1.005 - 1.030 pH, UA 5.5 5.0 - 7.5 Color, UA Yellow Yellow Appearance Ur Clear Clear Leukocytes,UA Negative Negative Protein,UA Trace (A) Negative/Trace Glucose, UA Negative Negative Ketones, UA Negative Negative RBC, UA Negative Negative Bilirubin,  UA Negative Negative Urobilinogen, Ur 0.2 0.2 - 1.0 mg/dL Nitrite, UA Negative Negative Microscopic Examination See below: Bladder Scan (Post Void Residual) in office Result Value Ref Range Scan Result 66ml  IPSS  Row Name 08/05/22 0800 International Prostate Symptom Score How often have you had the sensation of not emptying your bladder? Less than 1 in 5 How often have you had to urinate less than every two hours? Less than 1 in 5 times How often have you found you stopped and started again several times when you urinated? Less than half the time How often have you found it difficult to postpone urination? Less than 1 in 5 times How often have you had a weak urinary stream? Less than 1 in 5 times How often have you had to strain to start urination? Not at All How many times did you typically get up at night to urinate? 2 Times Total IPSS Score 8 Quality of Life due to urinary symptoms If you were to spend the rest of your life with your urinary condition just the way it is now how would you feel about that? Pleased    Score:  1-7 Mild 8-19 Moderate 20-35 Severe   PMH: Past Medical History: Diagnosis Date  Acute renal failure (HCC)  Anxiety  Bladder outlet obstruction  CLL (chronic lymphocytic leukemia) (HCC)  Elevated PSA  Foreign body of bladder  MS (multiple sclerosis) (HCC)  Pre-diabetes  Prostate cancer (HCC) 2013  UTI (urinary tract infection)   Surgical History: Past Surgical History: Procedure Laterality Date  CYSTOSCOPY N/A 12/16/2021 Procedure: CYSTOSCOPY WITH REMOVAL OF FOREIGN BODY;  Surgeon: Vanna Scotland, MD;  Location: [facility] ORS;  Service: Urology;  Laterality: N/A;  HOLEP-LASER ENUCLEATION OF THE PROSTATE WITH MORCELLATION N/A 03/11/2021 Procedure: HOLEP-LASER ENUCLEATION OF THE PROSTATE WITH MORCELLATION;  Surgeon: Vanna Scotland, MD;  Location: [facility] ORS;  Service: Urology;  Laterality: N/A;  KNEE  SURGERY 2002 meniscus  TRANSURETHRAL RESECTION OF PROSTATE N/A 12/16/2021 Procedure: TRANSURETHRAL RESECTION OF THE PROSTATE (TURP);  Surgeon: Vanna Scotland, MD;  Location: [facility] ORS;  Service: Urology;  Laterality: N/A;   Home Medications:  Allergies as of 08/05/2022  No Known Allergies  Medication List   Accurate as of August 05, 2022  9:06 AM. If you have any questions, ask your nurse or doctor.   STOP taking these medications   Na Sulfate-K Sulfate-Mg Sulf 17.5-3.13-1.6 GM/177ML Soln Stopped by: Vanna Scotland   TAKE these medications   allopurinol 100 MG tablet Commonly known as: ZYLOPRIM Take 100 mg by mouth daily. multivitamin tablet Take 1 tablet by mouth daily. rosuvastatin 5 MG tablet Commonly known as: CRESTOR Take 1 tablet by mouth at bedtime.    Family History: Family History Problem Relation Age of Onset  Cancer Sister     breast CA    Social History:  reports that he has never smoked. He has never been exposed to tobacco smoke. He has never used smokeless tobacco. He reports that he does not drink alcohol and does not use drugs.   Physical Exam: BP 108/67   Pulse 77   Wt 212 lb (96.2 kg)   BMI 28.75 kg/m   Constitutional:  Alert and oriented, No acute distress. HEENT: [place] AT, moist mucus membranes.  Trachea midline, no masses. Neurologic: Grossly intact, no focal deficits, moving all 4 extremities. Psychiatric: Normal mood and affect.   Assessment & Plan:    1. BPH with urinary obstruction - Continue monitoring urinary symptoms, continues to do well from a urinary control standpoint clinically - No new medications at this time. - Follow-up in 12 months with IPSS, PVR, PSA, and DRE - Return sooner if urinary symptoms worsen (e.g., weaker stream, increased frequency, incomplete bladder emptying).  2. Prostate cancer - Low-risk - Continue active surveillance. - Annual PSA monitoring.  3. CLL - Continue to follow up with Dr.  Donneta Romberg for CLL management. -CLL on prostate specimen, most recent CT scan does show persistent intravesical protrusion, possibly related to his progressive disease  Return in about 6 months (around 02/05/2023) for IPSS, PVR, PSA, and DRE.  I have reviewed the above documentation for accuracy and completeness, and I agree with the above.   Vanna Scotland, MD    [facility] [address], [address] [place], [place] [address] [phone]

## 2022-08-27 DIAGNOSIS — Z0000 Encounter for general adult medical examination without abnormal findings: Secondary | ICD-10-CM | POA: Diagnosis not present

## 2022-09-02 ENCOUNTER — Inpatient Hospital Stay: Payer: Federal, State, Local not specified - PPO | Admitting: Internal Medicine

## 2022-09-02 ENCOUNTER — Encounter: Payer: Self-pay | Admitting: Internal Medicine

## 2022-09-02 ENCOUNTER — Inpatient Hospital Stay: Payer: Federal, State, Local not specified - PPO | Attending: Internal Medicine

## 2022-09-02 VITALS — BP 116/77 | HR 67 | Temp 98.4°F | Ht 72.0 in | Wt 195.4 lb

## 2022-09-02 DIAGNOSIS — Z9079 Acquired absence of other genital organ(s): Secondary | ICD-10-CM | POA: Insufficient documentation

## 2022-09-02 DIAGNOSIS — Z79899 Other long term (current) drug therapy: Secondary | ICD-10-CM | POA: Insufficient documentation

## 2022-09-02 DIAGNOSIS — G35 Multiple sclerosis: Secondary | ICD-10-CM | POA: Diagnosis not present

## 2022-09-02 DIAGNOSIS — C9110 Chronic lymphocytic leukemia of B-cell type not having achieved remission: Secondary | ICD-10-CM | POA: Insufficient documentation

## 2022-09-02 DIAGNOSIS — Z8546 Personal history of malignant neoplasm of prostate: Secondary | ICD-10-CM | POA: Diagnosis not present

## 2022-09-02 DIAGNOSIS — N183 Chronic kidney disease, stage 3 unspecified: Secondary | ICD-10-CM | POA: Insufficient documentation

## 2022-09-02 LAB — CMP (CANCER CENTER ONLY)
ALT: 20 U/L (ref 0–44)
AST: 29 U/L (ref 15–41)
Albumin: 4.3 g/dL (ref 3.5–5.0)
Alkaline Phosphatase: 100 U/L (ref 38–126)
Anion gap: 6 (ref 5–15)
BUN: 23 mg/dL — ABNORMAL HIGH (ref 6–20)
CO2: 25 mmol/L (ref 22–32)
Calcium: 9.2 mg/dL (ref 8.9–10.3)
Chloride: 109 mmol/L (ref 98–111)
Creatinine: 1.13 mg/dL (ref 0.61–1.24)
GFR, Estimated: >60 mL/min (ref >60)
Glucose, Bld: 104 mg/dL — ABNORMAL HIGH (ref 70–99)
Potassium: 4.3 mmol/L (ref 3.5–5.1)
Sodium: 140 mmol/L (ref 135–145)
Total Bilirubin: 0.9 mg/dL (ref 0.3–1.2)
Total Protein: 6.9 g/dL (ref 6.5–8.1)

## 2022-09-02 LAB — CBC WITH DIFFERENTIAL (CANCER CENTER ONLY)
Abs Immature Granulocytes: 0.41 10*3/uL — ABNORMAL HIGH (ref 0.00–0.07)
Basophils Absolute: 0.2 10*3/uL — ABNORMAL HIGH (ref 0.0–0.1)
Basophils Relative: 0 %
Eosinophils Absolute: 0.2 10*3/uL (ref 0.0–0.5)
Eosinophils Relative: 0 %
HCT: 31.4 % — ABNORMAL LOW (ref 39.0–52.0)
Hemoglobin: 9.5 g/dL — ABNORMAL LOW (ref 13.0–17.0)
Immature Granulocytes: 0 %
Lymphocytes Relative: 79 %
Lymphs Abs: 145.9 10*3/uL — ABNORMAL HIGH (ref 0.7–4.0)
MCH: 29.1 pg (ref 26.0–34.0)
MCHC: 30.3 g/dL (ref 30.0–36.0)
MCV: 96.3 fL (ref 80.0–100.0)
Monocytes Absolute: 34.3 10*3/uL — ABNORMAL HIGH (ref 0.1–1.0)
Monocytes Relative: 19 %
Neutro Abs: 3.8 10*3/uL (ref 1.7–7.7)
Neutrophils Relative %: 2 %
Platelet Count: 123 10*3/uL — ABNORMAL LOW (ref 150–400)
RBC: 3.26 MIL/uL — ABNORMAL LOW (ref 4.22–5.81)
RDW: 18.1 % — ABNORMAL HIGH (ref 11.5–15.5)
Smear Review: NORMAL
WBC Count: 184.7 10*3/uL (ref 4.0–10.5)
nRBC: 0.1 % (ref 0.0–0.2)

## 2022-09-02 LAB — LACTATE DEHYDROGENASE: LDH: 408 U/L — ABNORMAL HIGH (ref 98–192)

## 2022-09-02 NOTE — Progress Notes (Signed)
Concerned with the weight loss. Is it him eating better/exercises or is it the cancer? Has lost 17 pounds.

## 2022-09-02 NOTE — Progress Notes (Signed)
Received critical lab result from [facility] in the Lab ~ WBC 184.7 Read back. Dr Donneta Romberg notified

## 2022-09-02 NOTE — Assessment & Plan Note (Signed)
#  CLL-CD38 positive stage III [retroperitoneal lymph nodes]-Asymptomatic. I[facility]- UNMUTATED; ? FISH trisomy 49; JULY  FISH panel- 12 trisomy-   APRIL 15th, 2024- Stable lymphadenopathy involving the chest, abdomen and pelvis. No new progressive findings;  however, Subsequent interval enlargement of the spleen.   # Today white count is 184,000 hemoglobin 9.5  platelets-120  Patient continues to be asymptomatic clinically-except for mild night sweats episodes,  weight loss  [see below]  Again, treatment will be recommended only if patient is significantly symptomatic.  Again at length I reviewed symptoms triggering treatment would be-profuse night sweats; weight loss; drop in his hemoglobin/platelets; frequent infections; significant lymphadenopathy etc. discussed with the patient that since his doubling time is 6 months-he might need treatment sooner.  But does not merit any treatment at this time.  # I again reviewed and at length discussed the treatment options include antibody therapy/fusions [Gazya x 6 months] + BTK inhibitors/pills [x one a day pill; indefinitely] vs Gazyva+ venatoclax. Overall stable-  # Weight loss- ? Combination of healthy life style, and progressive disease- monitor closely- as patient feels "healthy otherwise"  #History of urinary retention /prostatomegaly /prostate cancer  [Dr.Brandon]incidental-  2.1 x 0.9 cm stone or calcified mucosal lesion posterior left bladder wall in the region of the left UVJ.  Dr.Brandon. stable.   # CKD stage II-III- stable.  # MS clinically stable.  # DISPOSITION: # follow up in 6 weeks- MD; labs- cbc/cmp;LDH; uric acid; reticulocyte count; haptoglobin--  Dr.B

## 2022-09-02 NOTE — Progress Notes (Signed)
[facility] CONSULT NOTE  Patient Care Team: [name], MD as PCP - General (Family Medicine) Earna Coder, MD as Consulting Physician (Oncology)  CHIEF COMPLAINTS/PURPOSE OF CONSULTATION: CLL  Oncology History Overview Note  Chronic lymphocytic leukemia (CLL), positive for CD20, CD22, CD19 and  CD38,  see comment.   ANNOTATION COMMENT IMP Comment VC   Comment: (NOTE)  Expression of CD38 on >30% of the clonal B-cells is reported to be an  unfavorable prognostic factor in CLL.     # CLL stage I- [lymphocytosis/retroperitoneal lymphadenopathy up to 2.5 cm-incidental CT protocol- 2023];   APRIL 2024- consistent with trisomy 12. Results for  CCND1/IGH, ATM, 13q and TP53 were normal.   #GEN 2023-bilateral severe hydronephrosis-bladder outlet obstruction/status post catheter [Dr.Brandon]  #History of prostate cancer under surveillance [[facility]]   CLL (chronic lymphocytic leukemia) (HCC)  02/11/2021 Initial Diagnosis   CLL (chronic lymphocytic leukemia) (HCC)    HISTORY OF PRESENTING ILLNESS: Alone.  Ambulating independently.  Ralph [name] 57 y.o.  male history of multiple sclerosis-not on active therapy; prostate cancer on surveillance; CLL currently on surveillance is here for a follow-up..  Concerned with the weight loss. Is it him eating better/exercises or is it the cancer? Has lost 17 pound.   Appetite is good. However notes to have weight loss. States eating differently ["cleaner food"].  Patient admits to episode of intermittent sweats.  However they are not drenching.   Denies any lumps or bumps.  Denies any fevers or chills.  No recent infections.  Review of Systems  Constitutional:  Positive for weight loss. Negative for chills, diaphoresis, fever and malaise/fatigue.  HENT:  Negative for nosebleeds and sore throat.   Eyes:  Negative for double vision.  Respiratory:  Negative for cough, hemoptysis, sputum production, shortness of breath  and wheezing.   Cardiovascular:  Negative for chest pain, palpitations, orthopnea and leg swelling.  Gastrointestinal:  Negative for abdominal pain, blood in stool, constipation, diarrhea, heartburn, melena, nausea and vomiting.  Genitourinary:  Negative for dysuria, frequency and urgency.  Musculoskeletal:  Negative for back pain and joint pain.  Skin: Negative.  Negative for itching and rash.  Neurological:  Negative for dizziness, tingling, focal weakness, weakness and headaches.  Endo/Heme/Allergies:  Does not bruise/bleed easily.  Psychiatric/Behavioral:  Negative for depression. The patient is not nervous/anxious and does not have insomnia.      MEDICAL HISTORY:  Past Medical History:  Diagnosis Date   Acute renal failure (HCC)    Anxiety    Bladder outlet obstruction    CLL (chronic lymphocytic leukemia) (HCC)    Elevated PSA    Foreign body of bladder    MS (multiple sclerosis) (HCC)    Pre-diabetes    Prostate cancer (HCC) 2013   UTI (urinary tract infection)     SURGICAL HISTORY: Past Surgical History:  Procedure Laterality Date   CYSTOSCOPY N/A 12/16/2021   Procedure: CYSTOSCOPY WITH REMOVAL OF FOREIGN BODY;  Surgeon: Vanna Scotland, MD;  Location: [facility] OR[facility];  Service: Urology;  Laterality: N/A;   HOLEP-LASER ENUCLEATION OF THE PROSTATE WITH MORCELLATION N/A 03/11/2021   Procedure: HOLEP-LASER ENUCLEATION OF THE PROSTATE WITH MORCELLATION;  Surgeon: Vanna Scotland, MD;  Location: [facility] OR[facility];  Service: Urology;  Laterality: N/A;   KNEE SURGERY  2002   meniscus   TRANSURETHRAL RESECTION OF PROSTATE N/A 12/16/2021   Procedure: TRANSURETHRAL RESECTION OF THE PROSTATE (TURP);  Surgeon: Vanna Scotland, MD;  Location: [facility] OR[facility];  Service: Urology;  Laterality: N/A;  SOCIAL HISTORY: Social History   Socioeconomic History   Marital status: Single    Spouse name: Not on file   Number of children: 0   Years of education: College   Highest education level: Not on file   Occupational History   Occupation: Research officer, 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  Denies any alcohol; lives in [place].    Social Determinants of Health   Financial Resource Strain: Low Risk  (08/27/2022)   Received from [facility]   Overall Financial Resource Strain (CARDIA)    Difficulty of Paying Living Expenses: Not hard at all  Food Insecurity: No Food Insecurity (08/27/2022)   Received from [facility]   Hunger Vital Sign    Worried About Running Out of Food in the Last Year: Never true    Ran Out of Food in the Last Year: Never true  Transportation Needs: No Transportation Needs (08/27/2022)   Received from [facility] - Transportation    In the past 12 months, has lack of transportation kept you from medical appointments or from getting medications?: No    Lack of Transportation (Non-Medical): No  Physical Activity: Not on file  Stress: Not on file  Social Connections: Not on file  Intimate Partner Violence: Not on file    FAMILY HISTORY: Family History  Problem Relation Age of Onset   Cancer Sister        breast CA     ALLERGIES:  has No Known Allergies.  MEDICATIONS:  Current Outpatient Medications  Medication Sig Dispense Refill   allopurinol (ZYLOPRIM) 100 MG tablet Take 100 mg by mouth daily.     Multiple Vitamin (MULTIVITAMIN) tablet Take 1 tablet by mouth daily.     rosuvastatin (CRESTOR) 5 MG tablet Take 1 tablet by mouth at bedtime.     No current facility-administered  medications for this visit.      [name]  PHYSICAL EXAMINATION:  Vitals:   09/02/22 1013  BP: 116/77  Pulse: 67  Temp: 98.4 F (36.9 C)  SpO2: 100%   Filed Weights   09/02/22 1013  Weight: 195 lb 6.4 oz (88.6 kg)   Shotty bilateral neck adenopathy.  1 to 2 cm lymph node bilateral axilla left more than right.  Mild lymphadenopathy in the right inguinal region. Mild splenomegaly- 3 cm below the costal margin. ; hepatomegaly.   Physical Exam Vitals and nursing note reviewed.  HENT:     Head: Normocephalic and atraumatic.     Mouth/Throat:     Pharynx: Oropharynx is clear.  Eyes:     Extraocular Movements: Extraocular movements intact.     Pupils: Pupils are equal, round, and reactive to light.  Cardiovascular:     Rate and Rhythm: Normal rate and regular rhythm.  Pulmonary:     Comments: Decreased breath sounds bilaterally.  Abdominal:     Palpations: Abdomen is soft.  Musculoskeletal:  General: Normal range of motion.     Cervical back: Normal range of motion.  Skin:    General: Skin is warm.  Neurological:     General: No focal deficit present.     Mental Status: He is alert and oriented to person, place, and time.  Psychiatric:        Behavior: Behavior normal.        Judgment: Judgment normal.     LABORATORY DATA:  I have reviewed the data as listed Lab Results  Component Value Date   WBC 184.7 (HH) 09/02/2022   HGB 9.5 (L) 09/02/2022   HCT 31.4 (L) 09/02/2022   MCV 96.3 09/02/2022   PLT 123 (L) 09/02/2022   Recent Labs    04/24/22 1029 06/25/22 1026 09/02/22 1014  NA 142 138 140  K 4.1 3.9 4.3  CL 109 106 109  CO2 24 23 25   GLUCOSE 98 125* 104*  BUN 15 24* 23*  CREATININE 1.15 1.04 1.13  CALCIUM 8.8* 9.0 9.2  GFRNONAA >60 >60 >60  PROT 7.1 7.1 6.9  ALBUMIN 4.2 4.5 4.3  AST 32 26 29  ALT 25 19 20   ALKPHOS 125 112 100  BILITOT 0.9 1.3* 0.9    RADIOGRAPHIC STUDIES: I have personally reviewed the radiological images as listed and  agreed with the findings in the report. No results found.  CLL (chronic lymphocytic leukemia) (HCC) #CLL-CD38 positive stage III [retroperitoneal lymph nodes]-Asymptomatic. IGVH- UNMUTATED; ? FISH trisomy 46; JULY  FISH panel- 12 trisomy-   APRIL 15th, 2024- Stable lymphadenopathy involving the chest, abdomen and pelvis. No new progressive findings;  however, Subsequent interval enlargement of the spleen.   # Today white count is 184,000 hemoglobin 9.5  platelets-120  Patient continues to be asymptomatic clinically-except for mild night sweats episodes,  weight loss  [see below]  Again, treatment will be recommended only if patient is significantly symptomatic.  Again at length I reviewed symptoms triggering treatment would be-profuse night sweats; weight loss; drop in his hemoglobin/platelets; frequent infections; significant lymphadenopathy etc. discussed with the patient that since his doubling time is 6 months-he might need treatment sooner.  But does not merit any treatment at this time.  # I again reviewed and at length discussed the treatment options include antibody therapy/fusions [Gazya x 6 months] + BTK inhibitors/pills [x one a day pill; indefinitely] vs Gazyva+ venatoclax. Overall stable-  # Weight loss- ? Combination of healthy life style, and progressive disease- monitor closely- as patient feels "healthy otherwise"  #History of urinary retention /prostatomegaly /prostate cancer  [Dr.Brandon]incidental-  2.1 x 0.9 cm stone or calcified mucosal lesion posterior left bladder wall in the region of the left UVJ.  Dr.Brandon. stable.   # CKD stage II-III- stable.  # MS clinically stable.  # DISPOSITION: # follow up in 6 weeks- MD; labs- cbc/cmp;LDH; uric acid; reticulocyte count; haptoglobin--  Dr.B     All questions were answered. The patient knows to call the clinic with any problems, questions or concerns.       Earna Coder, MD 09/02/2022 11:26 AM

## 2022-09-11 DIAGNOSIS — I10 Essential (primary) hypertension: Secondary | ICD-10-CM | POA: Diagnosis not present

## 2022-09-11 DIAGNOSIS — E785 Hyperlipidemia, unspecified: Secondary | ICD-10-CM | POA: Diagnosis not present

## 2022-10-01 DIAGNOSIS — M545 Low back pain, unspecified: Secondary | ICD-10-CM | POA: Diagnosis not present

## 2022-10-20 ENCOUNTER — Inpatient Hospital Stay: Payer: Federal, State, Local not specified - PPO | Attending: Internal Medicine

## 2022-10-20 ENCOUNTER — Encounter: Payer: Self-pay | Admitting: *Deleted

## 2022-10-20 ENCOUNTER — Encounter: Payer: Self-pay | Admitting: Internal Medicine

## 2022-10-20 ENCOUNTER — Inpatient Hospital Stay: Payer: Federal, State, Local not specified - PPO | Admitting: Internal Medicine

## 2022-10-20 DIAGNOSIS — Z79899 Other long term (current) drug therapy: Secondary | ICD-10-CM | POA: Insufficient documentation

## 2022-10-20 DIAGNOSIS — Z8546 Personal history of malignant neoplasm of prostate: Secondary | ICD-10-CM | POA: Insufficient documentation

## 2022-10-20 DIAGNOSIS — G35 Multiple sclerosis: Secondary | ICD-10-CM | POA: Insufficient documentation

## 2022-10-20 DIAGNOSIS — C9110 Chronic lymphocytic leukemia of B-cell type not having achieved remission: Secondary | ICD-10-CM | POA: Insufficient documentation

## 2022-10-20 LAB — CMP (CANCER CENTER ONLY)
ALT: 15 U/L (ref 0–44)
AST: 23 U/L (ref 15–41)
Albumin: 4.3 g/dL (ref 3.5–5.0)
Alkaline Phosphatase: 122 U/L (ref 38–126)
Anion gap: 5 (ref 5–15)
BUN: 26 mg/dL — ABNORMAL HIGH (ref 6–20)
CO2: 26 mmol/L (ref 22–32)
Calcium: 9 mg/dL (ref 8.9–10.3)
Chloride: 109 mmol/L (ref 98–111)
Creatinine: 1.09 mg/dL (ref 0.61–1.24)
GFR, Estimated: >60 mL/min (ref >60)
Glucose, Bld: 107 mg/dL — ABNORMAL HIGH (ref 70–99)
Potassium: 4.4 mmol/L (ref 3.5–5.1)
Sodium: 140 mmol/L (ref 135–145)
Total Bilirubin: 1 mg/dL (ref 0.3–1.2)
Total Protein: 7 g/dL (ref 6.5–8.1)

## 2022-10-20 LAB — CBC WITH DIFFERENTIAL (CANCER CENTER ONLY)
Abs Immature Granulocytes: 0 10*3/uL (ref 0.00–0.07)
Basophils Absolute: 0 10*3/uL (ref 0.0–0.1)
Basophils Relative: 0 %
Eosinophils Absolute: 0 10*3/uL (ref 0.0–0.5)
Eosinophils Relative: 0 %
HCT: 31 % — ABNORMAL LOW (ref 39.0–52.0)
Hemoglobin: 9.4 g/dL — ABNORMAL LOW (ref 13.0–17.0)
Lymphocytes Relative: 94 %
Lymphs Abs: 181.8 10*3/uL — ABNORMAL HIGH (ref 0.7–4.0)
MCH: 29.1 pg (ref 26.0–34.0)
MCHC: 30.3 g/dL (ref 30.0–36.0)
MCV: 96 fL (ref 80.0–100.0)
Monocytes Absolute: 5.8 10*3/uL — ABNORMAL HIGH (ref 0.1–1.0)
Monocytes Relative: 3 %
Neutro Abs: 5.8 10*3/uL (ref 1.7–7.7)
Neutrophils Relative %: 3 %
Platelet Count: 147 10*3/uL — ABNORMAL LOW (ref 150–400)
RBC: 3.23 MIL/uL — ABNORMAL LOW (ref 4.22–5.81)
RDW: 17.6 % — ABNORMAL HIGH (ref 11.5–15.5)
Smear Review: NORMAL
WBC Count: 193.4 10*3/uL (ref 4.0–10.5)
WBC Morphology: ABNORMAL
nRBC: 0.1 % (ref 0.0–0.2)

## 2022-10-20 LAB — LACTATE DEHYDROGENASE: LDH: 363 U/L — ABNORMAL HIGH (ref 98–192)

## 2022-10-20 LAB — URIC ACID: Uric Acid, Serum: 8.8 mg/dL — ABNORMAL HIGH (ref 3.7–8.6)

## 2022-10-20 LAB — RETIC PANEL
Immature Retic Fract: 39.1 % — ABNORMAL HIGH (ref 2.3–15.9)
RBC.: 3.24 MIL/uL — ABNORMAL LOW (ref 4.22–5.81)
Retic Count, Absolute: 55.1 10*3/uL (ref 19.0–186.0)
Retic Ct Pct: 1.7 % (ref 0.4–3.1)
Reticulocyte Hemoglobin: 30 pg (ref >27.9)

## 2022-10-20 NOTE — Progress Notes (Signed)
[facility] CONSULT NOTE  Patient Care Team: [name], MD as PCP - General (Family Medicine) Earna Coder, MD as Consulting Physician (Oncology)  CHIEF COMPLAINTS/PURPOSE OF CONSULTATION: CLL  Oncology History Overview Note  Chronic lymphocytic leukemia (CLL), positive for CD20, CD22, CD19 and  CD38,  see comment.   ANNOTATION COMMENT IMP Comment VC   Comment: (NOTE)  Expression of CD38 on >30% of the clonal B-cells is reported to be an  unfavorable prognostic factor in CLL.     # CLL stage I- [lymphocytosis/retroperitoneal lymphadenopathy up to 2.5 cm-incidental CT protocol- 2023];   APRIL 2024- consistent with trisomy 12. Results for  CCND1/IGH, ATM, 13q and TP53 were normal.   #GEN 2023-bilateral severe hydronephrosis-bladder outlet obstruction/status post catheter [Dr.Brandon]  #History of prostate cancer under surveillance [[facility]]   CLL (chronic lymphocytic leukemia) (HCC)  02/11/2021 Initial Diagnosis   CLL (chronic lymphocytic leukemia) (HCC)    HISTORY OF PRESENTING ILLNESS: Alone.  Ambulating independently.  Ralph [name] 57 y.o.  male history of multiple sclerosis-not on active therapy; prostate cancer on surveillance; CLL currently on surveillance is here for a follow-up.[name]  Appetite is good. However notes to have weight loss. States eating differently ["cleaner food"].  Patient admits to episode of intermittent sweats.  However they are not drenching.   Denies any lumps or bumps.  Denies any fevers or chills.  No recent infections.  Review of Systems  Constitutional:  Positive for weight loss. Negative for chills, diaphoresis, fever and malaise/fatigue.  HENT:  Negative for nosebleeds and sore throat.   Eyes:  Negative for double vision.  Respiratory:  Negative for cough, hemoptysis, sputum production, shortness of breath and wheezing.   Cardiovascular:  Negative for chest pain, palpitations, orthopnea and leg swelling.   Gastrointestinal:  Negative for abdominal pain, blood in stool, constipation, diarrhea, heartburn, melena, nausea and vomiting.  Genitourinary:  Negative for dysuria, frequency and urgency.  Musculoskeletal:  Negative for back pain and joint pain.  Skin: Negative.  Negative for itching and rash.  Neurological:  Negative for dizziness, tingling, focal weakness, weakness and headaches.  Endo/Heme/Allergies:  Does not bruise/bleed easily.  Psychiatric/Behavioral:  Negative for depression. The patient is not nervous/anxious and does not have insomnia.      MEDICAL HISTORY:  Past Medical History:  Diagnosis Date   Acute renal failure (HCC)    Anxiety    Bladder outlet obstruction    CLL (chronic lymphocytic leukemia) (HCC)    Elevated PSA    Foreign body of bladder    MS (multiple sclerosis) (HCC)    Pre-diabetes    Prostate cancer (HCC) 2013   UTI (urinary tract infection)     SURGICAL HISTORY: Past Surgical History:  Procedure Laterality Date   CYSTOSCOPY N/A 12/16/2021   Procedure: CYSTOSCOPY WITH REMOVAL OF FOREIGN BODY;  Surgeon: Vanna Scotland, MD;  Location: [facility];  Service: Urology;  Laterality: N/A;   HOLEP-LASER ENUCLEATION OF THE PROSTATE WITH MORCELLATION N/A 03/11/2021   Procedure: HOLEP-LASER ENUCLEATION OF THE PROSTATE WITH MORCELLATION;  Surgeon: Vanna Scotland, MD;  Location: [facility];  Service: Urology;  Laterality: N/A;   KNEE SURGERY  2002   meniscus   TRANSURETHRAL RESECTION OF PROSTATE N/A 12/16/2021   Procedure: TRANSURETHRAL RESECTION OF THE PROSTATE (TURP);  Surgeon: Vanna Scotland, MD;  Location: [facility];  Service: Urology;  Laterality: N/A;    SOCIAL HISTORY: Social History   Socioeconomic History   Marital status: Single    Spouse name:  Not on file   Number of children: 0   Years of education: College   Highest education level: Not on file  Occupational History   Occupation: Research officer, political party [employer]  Tobacco Use   Smoking status: Never     Passive exposure: Never   Smokeless tobacco: Never  Vaping Use   Vaping status: Never Used  Substance and Sexual Activity   Alcohol use: No    Comment: Former alcohol consumption   Drug use: No   Sexual activity: Not Currently  Other Topics Concern   Not on file  Social History Narrative   Lives alone   Caffeine use: Diet coke/pepsi/Mt Dew   Right handed       Denies history of smoking.  Denies any alcohol; lives in [place].    Social Determinants of Health   Financial Resource Strain: Low Risk  (08/27/2022)   Received from [facility]   Overall Financial Resource Strain (CARDIA)    Difficulty of Paying Living Expenses: Not hard at all  Food Insecurity: No Food Insecurity (08/27/2022)   Received from [facility]   Hunger Vital Sign    Worried About Running Out of Food in the Last Year: Never true    Ran Out of Food in the Last Year: Never true  Transportation Needs: No Transportation Needs (08/27/2022)   Received from [facility]    In the past 12 months, has lack of transportation kept you from medical appointments or from getting medications?: No    Lack of Transportation (Non-Medical): No  Physical Activity: Not on file  Stress: Not on file  Social Connections: Not on file  Intimate Partner Violence: Not on file    FAMILY HISTORY: Family History  Problem Relation Age of Onset   Cancer Sister        breast CA     ALLERGIES:  has No Known Allergies.  MEDICATIONS:  Current Outpatient Medications  Medication Sig Dispense Refill   allopurinol (ZYLOPRIM) 100 MG tablet Take 100 mg by mouth daily.     Multiple Vitamin (MULTIVITAMIN) tablet Take 1 tablet by mouth daily.     rosuvastatin (CRESTOR) 5 MG tablet Take 1 tablet by mouth at bedtime.     No current facility-administered medications for this visit.      [name]  PHYSICAL EXAMINATION:  Vitals:   10/20/22 0909  BP: 110/70   Pulse: 72  Temp: 98.2 F (36.8 C)  SpO2: 100%   Filed Weights   10/20/22 0909  Weight: 197 lb 3.2 oz (89.4 kg)   Shotty bilateral neck adenopathy.  1 to 2 cm lymph node bilateral axilla left more than right.  Mild lymphadenopathy in the right inguinal region. Mild splenomegaly- 4 fingers breadth below the costal margin. ; hepatomegaly.   Physical Exam Vitals and nursing note reviewed.  HENT:     Head: Normocephalic and atraumatic.     Mouth/Throat:     Pharynx: Oropharynx is clear.  Eyes:     Extraocular Movements: Extraocular movements intact.     Pupils: Pupils are equal, round, and reactive to light.  Cardiovascular:     Rate and Rhythm: Normal rate and regular rhythm.  Pulmonary:     Comments: Decreased breath sounds bilaterally.  Abdominal:     Palpations: Abdomen is soft.  Musculoskeletal:        General: Normal range of motion.     Cervical back: Normal range of motion.  Skin:    General: Skin is warm.  Neurological:     General: No focal deficit present.     Mental Status: He is alert and oriented to person, place, and time.  Psychiatric:        Behavior: Behavior normal.        Judgment: Judgment normal.     LABORATORY DATA:  I have reviewed the data as listed Lab Results  Component Value Date   WBC 193.4 (HH) 10/20/2022   HGB 9.4 (L) 10/20/2022   HCT 31.0 (L) 10/20/2022   MCV 96.0 10/20/2022   PLT 147 (L) 10/20/2022   Recent Labs    06/25/22 1026 09/02/22 1014 10/20/22 0901  NA 138 140 140  K 3.9 4.3 4.4  CL 106 109 109  CO2 23 25 26   GLUCOSE 125* 104* 107*  BUN 24* 23* 26*  CREATININE 1.04 1.13 1.09  CALCIUM 9.0 9.2 9.0  GFRNONAA >60 >60 >60  PROT 7.1 6.9 7.0  ALBUMIN 4.5 4.3 4.3  AST 26 29 23   ALT 19 20 15   ALKPHOS 112 100 122  BILITOT 1.3* 0.9 1.0    RADIOGRAPHIC STUDIES: I have personally reviewed the radiological images as listed and agreed with the findings in the report. No results found.  CLL (chronic lymphocytic  leukemia) (HCC) #CLL-CD38 positive stage III [retroperitoneal lymph nodes]-Asymptomatic. [facility]- UNMUTATED; ? FISH trisomy 83; JULY  FISH panel- 12 trisomy-   APRIL 15th, 2024- Stable lymphadenopathy involving the chest, abdomen and pelvis. No new progressive findings;  however, Subsequent interval enlargement of the spleen.   # Today white count is 193 000 hemoglobin 9.5  platelets-140s.  Patient continues to be asymptomatic clinically-except for mild night sweats episodes,  weight loss  [see below]  Again, treatment will be recommended only if patient is significantly symptomatic.   Overa ll stable.   # I again at length I reviewed symptoms triggering treatment would be-profuse night sweats; weight loss; drop in his hemoglobin/platelets; frequent infections; significant lymphadenopathy etc. discussed with the patient that since his doubling time is 6 months-he might need treatment sooner.  But does not merit any treatment at this time.  # I again reviewed and at length discussed the treatment options include antibody therapy/fusions [Gazya x 6 months] + BTK inhibitors/pills [x one a day pill; indefinitely] vs Gazyva+ venatoclax.   # Weight loss- ? Combination of healthy life style, and progressive disease- monitor closely- as patient feels "healthy otherwise"  #History of urinary retention /prostatomegaly /prostate cancer  [Dr.Brandon]incidental-  2.1 x 0.9 cm stone or calcified mucosal lesion posterior left bladder wall in the region of the left UVJ.  Dr.Brandon. stable.   # CKD stage II-III-  stable.   # MS clinically stable.  # Secondary cancers: colo-[march 2024; [place]]; recommend surveillaince with dermatology. Will refer  # Vaccination: discussed re: Flu shots; pneumonia vaccination [s/p Shigles vacciation- in past]  # DISPOSITION: #  dermatology referral re: Risk of skin cancer/hx of CLL # note to work # follow up in 2 month- MD; labs- cbc/cmp;LDH; uric acid; reticulocyte count-    Dr.B   All questions were answered. The patient knows to call the clinic with any problems, questions or concerns.       Earna Coder, MD 10/20/2022 10:33 AM

## 2022-10-20 NOTE — Assessment & Plan Note (Addendum)
#  CLL-CD38 positive stage III [retroperitoneal lymph nodes]-Asymptomatic. I[facility]- UNMUTATED; ? FISH trisomy 50; JULY  FISH panel- 12 trisomy-   APRIL 15th, 2024- Stable lymphadenopathy involving the chest, abdomen and pelvis. No new progressive findings;  however, Subsequent interval enlargement of the spleen.   # Today white count is 193 000 hemoglobin 9.5  platelets-140s.  Patient continues to be asymptomatic clinically-except for mild night sweats episodes,  weight loss  [see below]  Again, treatment will be recommended only if patient is significantly symptomatic.   Overa ll stable.   # I again at length I reviewed symptoms triggering treatment would be-profuse night sweats; weight loss; drop in his hemoglobin/platelets; frequent infections; significant lymphadenopathy etc. discussed with the patient that since his doubling time is 6 months-he might need treatment sooner.  But does not merit any treatment at this time.  # I again reviewed and at length discussed the treatment options include antibody therapy/fusions [Gazya x 6 months] + BTK inhibitors/pills [x one a day pill; indefinitely] vs Gazyva+ venatoclax.   # Weight loss- ? Combination of healthy life style, and progressive disease- monitor closely- as patient feels "healthy otherwise"  #History of urinary retention /prostatomegaly /prostate cancer  [Dr.Brandon]incidental-  2.1 x 0.9 cm stone or calcified mucosal lesion posterior left bladder wall in the region of the left UVJ.  Dr.Brandon. stable.   # CKD stage II-III-  stable.   # MS clinically stable.  # Secondary cancers: colo-[march 2024; [place]]; recommend surveillaince with dermatology. Will refer  # Vaccination: discussed re: Flu shots; pneumonia vaccination [s/p Shigles vacciation- in past]  # DISPOSITION: #  dermatology referral re: Risk of skin cancer/hx of CLL # note to work # follow up in 2 month- MD; labs- cbc/cmp;LDH; uric acid; reticulocyte count-   Dr.B

## 2022-10-20 NOTE — Progress Notes (Signed)
Critical lab received from [facility] in Lab; WBC 193. Read back. Dr B notified.

## 2022-10-20 NOTE — Progress Notes (Signed)
No questions/concerns today.

## 2022-10-21 LAB — HAPTOGLOBIN: Haptoglobin: 119 mg/dL (ref 29–370)

## 2022-10-22 DIAGNOSIS — M5416 Radiculopathy, lumbar region: Secondary | ICD-10-CM | POA: Diagnosis not present

## 2022-10-30 DIAGNOSIS — M5416 Radiculopathy, lumbar region: Secondary | ICD-10-CM | POA: Diagnosis not present

## 2022-11-07 DIAGNOSIS — M5416 Radiculopathy, lumbar region: Secondary | ICD-10-CM | POA: Diagnosis not present

## 2022-11-13 DIAGNOSIS — M5416 Radiculopathy, lumbar region: Secondary | ICD-10-CM | POA: Diagnosis not present

## 2022-11-17 ENCOUNTER — Encounter: Payer: Self-pay | Admitting: Dermatology

## 2022-11-17 ENCOUNTER — Ambulatory Visit: Payer: Federal, State, Local not specified - PPO | Admitting: Dermatology

## 2022-11-17 DIAGNOSIS — D1801 Hemangioma of skin and subcutaneous tissue: Secondary | ICD-10-CM

## 2022-11-17 DIAGNOSIS — D229 Melanocytic nevi, unspecified: Secondary | ICD-10-CM

## 2022-11-17 DIAGNOSIS — L578 Other skin changes due to chronic exposure to nonionizing radiation: Secondary | ICD-10-CM

## 2022-11-17 DIAGNOSIS — L219 Seborrheic dermatitis, unspecified: Secondary | ICD-10-CM | POA: Diagnosis not present

## 2022-11-17 DIAGNOSIS — L80 Vitiligo: Secondary | ICD-10-CM | POA: Insufficient documentation

## 2022-11-17 DIAGNOSIS — Z1283 Encounter for screening for malignant neoplasm of skin: Secondary | ICD-10-CM

## 2022-11-17 NOTE — Patient Instructions (Signed)

## 2022-11-17 NOTE — Progress Notes (Signed)
New Patient Visit   Subjective  Ralph [name] is a 57 y.o. male who presents for the following: Skin Cancer Screening and Full Body Skin Exam  The patient presents for Total-Body Skin Exam (TBSE) for skin cancer screening and mole check. The patient has spots, moles and lesions to be evaluated, some may be new or changing and the patient may have concern these could be cancer.  Patient's oncologist recommended patient have skin cancer screening due to history of CLL. Patient also with hx of MS, prostate cancer. No personal or fhx skin cancer.   The following portions of the chart were reviewed this encounter and updated as appropriate: medications, allergies, medical history  Review of Systems:  No other skin or systemic complaints except as noted in HPI or Assessment and Plan.  Objective  Well appearing patient in no apparent distress; mood and affect are within normal limits.  A full examination was performed including scalp, head, eyes, ears, nose, lips, neck, chest, axillae, abdomen, back, buttocks, bilateral upper extremities, bilateral lower extremities, hands, feet, fingers, toes, fingernails, and toenails. All findings within normal limits unless otherwise noted below.   Relevant physical exam findings are noted in the Assessment and Plan.    Assessment & Plan   SKIN CANCER SCREENING PERFORMED TODAY.  ACTINIC DAMAGE - Chronic condition, secondary to cumulative UV/sun exposure - diffuse scaly erythematous macules with underlying dyspigmentation - Recommend daily broad spectrum sunscreen SPF 30+ to sun-exposed areas, reapply every 2 hours as needed.  - Staying in the shade or wearing long sleeves, sun glasses (UVA+UVB protection) and wide brim hats (4-inch brim around the entire circumference of the hat) are also recommended for sun protection.  - Call for new or changing lesions.  HEMANGIOMAS - Benign normal skin lesions - Benign-appearing - Call for any  changes  MELANOCYTIC NEVI - Tan-brown and/or pink-flesh-colored symmetric macules and papules - Benign appearing on exam today - Observation - Call clinic for new or changing moles - Recommend daily use of broad spectrum spf 30+ sunscreen to sun-exposed areas.   SEBORRHEIC DERMATITIS Exam: erythema and scaling of alar creases  Chronic and persistent condition with duration or expected duration over one year. Condition is symptomatic/ bothersome to patient. Not currently at goal.   Seborrheic Dermatitis is a chronic persistent rash characterized by pinkness and scaling most commonly of the mid face but also can occur on the scalp (dandruff), ears; mid chest, mid back and groin.  It tends to be exacerbated by stress and cooler weather.  People who have neurologic disease may experience new onset or exacerbation of existing seborrheic dermatitis.  The condition is not curable but treatable and can be controlled.  Treatment Plan: Not bothersome for patient, treatment deferred  VITILIGO Exam: depigmented patches and macules on bilateral distal lower legs  Vitiligo is a chronic autoimmune condition which causes loss of skin pigment and is commonly seen on the face and may also involve areas of trauma like hands, elbows, knees, and ankles. There is no cure and it is difficult to treat.  Treatments include topical steroids and other topical anti-inflammatory ointments/creams and topical and oral Jak inhibitors.  Sometimes narrow band UV light therapy or Xtrac laser is helpful, both of which require twice weekly treatments for at least 3-6 months.  Antioxidant vitamins, such as Vitamins A,C,E,D, Folic Acid and B12 may be added to enhance treatment. Heliocare may also enhance treatment results.  Treatment Plan: Not bothersome for patient, treatment deferred  Return in about 2 years (around 11/16/2024) for TBSE, with Dr. Katrinka Blazing.  Anise Salvo, RMA, am acting as scribe for  Elie Goody, MD .   Documentation: I have reviewed the above documentation for accuracy and completeness, and I agree with the above.  Elie Goody, MD

## 2022-11-17 NOTE — Addendum Note (Signed)
Addended by: Elie Goody on: 11/17/2022 03:13 PM   Modules accepted: Level of Service

## 2022-11-21 DIAGNOSIS — M5416 Radiculopathy, lumbar region: Secondary | ICD-10-CM | POA: Diagnosis not present

## 2022-12-01 DIAGNOSIS — F439 Reaction to severe stress, unspecified: Secondary | ICD-10-CM | POA: Diagnosis not present

## 2022-12-01 DIAGNOSIS — M545 Low back pain, unspecified: Secondary | ICD-10-CM | POA: Diagnosis not present

## 2022-12-17 ENCOUNTER — Telehealth: Payer: Self-pay

## 2022-12-17 ENCOUNTER — Inpatient Hospital Stay: Payer: Federal, State, Local not specified - PPO | Admitting: Internal Medicine

## 2022-12-17 ENCOUNTER — Encounter: Payer: Self-pay | Admitting: Internal Medicine

## 2022-12-17 ENCOUNTER — Other Ambulatory Visit (HOSPITAL_COMMUNITY): Payer: Self-pay

## 2022-12-17 ENCOUNTER — Telehealth: Payer: Self-pay | Admitting: Pharmacist

## 2022-12-17 ENCOUNTER — Inpatient Hospital Stay: Payer: Federal, State, Local not specified - PPO | Attending: Internal Medicine

## 2022-12-17 VITALS — BP 128/72 | HR 85 | Temp 98.6°F | Ht 72.0 in | Wt 198.2 lb

## 2022-12-17 DIAGNOSIS — Z79899 Other long term (current) drug therapy: Secondary | ICD-10-CM | POA: Insufficient documentation

## 2022-12-17 DIAGNOSIS — C9110 Chronic lymphocytic leukemia of B-cell type not having achieved remission: Secondary | ICD-10-CM

## 2022-12-17 LAB — CMP (CANCER CENTER ONLY)
ALT: 20 U/L (ref 0–44)
AST: 24 U/L (ref 15–41)
Albumin: 4.2 g/dL (ref 3.5–5.0)
Alkaline Phosphatase: 107 U/L (ref 38–126)
Anion gap: 8 (ref 5–15)
BUN: 24 mg/dL — ABNORMAL HIGH (ref 6–20)
CO2: 25 mmol/L (ref 22–32)
Calcium: 9.4 mg/dL (ref 8.9–10.3)
Chloride: 109 mmol/L (ref 98–111)
Creatinine: 1.23 mg/dL (ref 0.61–1.24)
GFR, Estimated: >60 mL/min (ref >60)
Glucose, Bld: 113 mg/dL — ABNORMAL HIGH (ref 70–99)
Potassium: 4 mmol/L (ref 3.5–5.1)
Sodium: 142 mmol/L (ref 135–145)
Total Bilirubin: 1.2 mg/dL — ABNORMAL HIGH (ref <1.2)
Total Protein: 6.7 g/dL (ref 6.5–8.1)

## 2022-12-17 LAB — CBC (CANCER CENTER ONLY)
HCT: 29 % — ABNORMAL LOW (ref 39.0–52.0)
Hemoglobin: 8.5 g/dL — ABNORMAL LOW (ref 13.0–17.0)
MCH: 29 pg (ref 26.0–34.0)
MCHC: 29.3 g/dL — ABNORMAL LOW (ref 30.0–36.0)
MCV: 99 fL (ref 80.0–100.0)
Platelet Count: 128 10*3/uL — ABNORMAL LOW (ref 150–400)
RBC: 2.93 MIL/uL — ABNORMAL LOW (ref 4.22–5.81)
RDW: 19 % — ABNORMAL HIGH (ref 11.5–15.5)
WBC Count: 223.8 10*3/uL (ref 4.0–10.5)
nRBC: 0.1 % (ref 0.0–0.2)

## 2022-12-17 LAB — RETIC PANEL
Immature Retic Fract: 36.3 % — ABNORMAL HIGH (ref 2.3–15.9)
RBC.: 2.89 MIL/uL — ABNORMAL LOW (ref 4.22–5.81)
Retic Count, Absolute: 63.9 10*3/uL (ref 19.0–186.0)
Retic Ct Pct: 2.2 % (ref 0.4–3.1)
Reticulocyte Hemoglobin: 29.5 pg (ref >27.9)

## 2022-12-17 LAB — LACTATE DEHYDROGENASE: LDH: 403 U/L — ABNORMAL HIGH (ref 98–192)

## 2022-12-17 LAB — URIC ACID: Uric Acid, Serum: 9.5 mg/dL — ABNORMAL HIGH (ref 3.7–8.6)

## 2022-12-17 NOTE — Progress Notes (Unsigned)
 No questions/concerns today.

## 2022-12-17 NOTE — Progress Notes (Unsigned)
[facility] CONSULT NOTE  Patient Care Team: [name], MD as PCP - General (Family Medicine) Earna Coder, MD as Consulting Physician (Oncology)  CHIEF COMPLAINTS/PURPOSE OF CONSULTATION: CLL  Oncology History Overview Note  Chronic lymphocytic leukemia (CLL), positive for CD20, CD22, CD19 and  CD38,  see comment.   ANNOTATION COMMENT IMP Comment VC   Comment: (NOTE)  Expression of CD38 on >30% of the clonal B-cells is reported to be an  unfavorable prognostic factor in CLL.     # CLL stage I- [lymphocytosis/retroperitoneal lymphadenopathy up to 2.5 cm-incidental CT protocol- 2023];   APRIL 2024- consistent with trisomy 12. Results for  CCND1/IGH, ATM, 13q and TP53 were normal.   #GEN 2023-bilateral severe hydronephrosis-bladder outlet obstruction/status post catheter [Dr.Brandon]  #History of prostate cancer under surveillance [[facility]]   CLL (chronic lymphocytic leukemia) (HCC)  02/11/2021 Initial Diagnosis   CLL (chronic lymphocytic leukemia) (HCC)    HISTORY OF PRESENTING ILLNESS: Alone.  Ambulating independently.  Ralph [name] 57 y.o.  male history of multiple sclerosis-not on active therapy; prostate cancer on surveillance; CLL currently on surveillance is here for a follow-up.[name]  Appetite is good. However notes to have weight loss. States eating differently ["cleaner food"].  Patient admits to episode of intermittent sweats.  However they are not drenching.   Denies any lumps or bumps.  Denies any fevers or chills.  No recent infections.  Review of Systems  Constitutional:  Positive for weight loss. Negative for chills, diaphoresis, fever and malaise/fatigue.  HENT:  Negative for nosebleeds and sore throat.   Eyes:  Negative for double vision.  Respiratory:  Negative for cough, hemoptysis, sputum production, shortness of breath and wheezing.   Cardiovascular:  Negative for chest pain, palpitations, orthopnea and leg swelling.   Gastrointestinal:  Negative for abdominal pain, blood in stool, constipation, diarrhea, heartburn, melena, nausea and vomiting.  Genitourinary:  Negative for dysuria, frequency and urgency.  Musculoskeletal:  Negative for back pain and joint pain.  Skin: Negative.  Negative for itching and rash.  Neurological:  Negative for dizziness, tingling, focal weakness, weakness and headaches.  Endo/Heme/Allergies:  Does not bruise/bleed easily.  Psychiatric/Behavioral:  Negative for depression. The patient is not nervous/anxious and does not have insomnia.      MEDICAL HISTORY:  Past Medical History:  Diagnosis Date   Acute renal failure (HCC)    Anxiety    Bladder outlet obstruction    CLL (chronic lymphocytic leukemia) (HCC)    Elevated PSA    Foreign body of bladder    MS (multiple sclerosis) (HCC)    Pre-diabetes    Prostate cancer (HCC) 2013   UTI (urinary tract infection)     SURGICAL HISTORY: Past Surgical History:  Procedure Laterality Date   CYSTOSCOPY N/A 12/16/2021   Procedure: CYSTOSCOPY WITH REMOVAL OF FOREIGN BODY;  Surgeon: Vanna Scotland, MD;  Location: [facility] ORS;  Service: Urology;  Laterality: N/A;   HOLEP-LASER ENUCLEATION OF THE PROSTATE WITH MORCELLATION N/A 03/11/2021   Procedure: HOLEP-LASER ENUCLEATION OF THE PROSTATE WITH MORCELLATION;  Surgeon: Vanna Scotland, MD;  Location: [facility] ORS;  Service: Urology;  Laterality: N/A;   KNEE SURGERY  2002   meniscus   TRANSURETHRAL RESECTION OF PROSTATE N/A 12/16/2021   Procedure: TRANSURETHRAL RESECTION OF THE PROSTATE (TURP);  Surgeon: Vanna Scotland, MD;  Location: [facility] ORS;  Service: Urology;  Laterality: N/A;    SOCIAL HISTORY: Social History   Socioeconomic History   Marital status: Single    Spouse name:  Not on file   Number of children: 0   Years of education: College   Highest education level: Not on file  Occupational History   Occupation: Research officer, 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  Denies any alcohol; lives in [place].    Social Determinants of Health   Financial Resource Strain: Low Risk  (08/27/2022)   Received from [facility]   Overall Financial Resource Strain (CARDIA)    Difficulty of Paying Living Expenses: Not hard at all  Food Insecurity: No Food Insecurity (08/27/2022)   Received from [facility]   Hunger Vital Sign    Worried About Running Out of Food in the Last Year: Never true    Ran Out of Food in the Last Year: Never true  Transportation Needs: No Transportation Needs (08/27/2022)   [facility] - Transportation    In the past 12 months, has lack of transportation kept you from medical appointments or from getting medications?: No    Lack of Transportation (Non-Medical): No  Physical Activity: Not on file  Stress: Not on file  Social Connections: Not on file  Intimate Partner Violence: Not on file    FAMILY HISTORY: Family History  Problem Relation Age of Onset   Cancer Sister        breast CA     ALLERGIES:  has No Known Allergies.  MEDICATIONS:  Current Outpatient Medications  Medication Sig Dispense Refill   allopurinol (ZYLOPRIM) 100 MG tablet Take 100 mg by mouth daily.     Multiple Vitamin (MULTIVITAMIN) tablet Take 1 tablet by mouth daily.     rosuvastatin (CRESTOR) 5 MG tablet Take 1 tablet by mouth at bedtime.     No current facility-administered medications for this visit.      [name]  PHYSICAL EXAMINATION:  Vitals:   12/17/22 1449  BP: 128/72   Pulse: 85  Temp: 98.6 F (37 C)  SpO2: 100%    Filed Weights   12/17/22 1449  Weight: 198 lb 3.2 oz (89.9 kg)    Shotty bilateral neck adenopathy.  1 to 2 cm lymph node bilateral axilla left more than right.  Mild lymphadenopathy in the right inguinal region. Mild splenomegaly- 4 fingers breadth below the costal margin. ; hepatomegaly.   Physical Exam Vitals and nursing note reviewed.  HENT:     Head: Normocephalic and atraumatic.     Mouth/Throat:     Pharynx: Oropharynx is clear.  Eyes:     Extraocular Movements: Extraocular movements intact.     Pupils: Pupils are equal, round, and reactive to light.  Cardiovascular:     Rate and Rhythm: Normal rate and regular rhythm.  Pulmonary:     Comments: Decreased breath sounds bilaterally.  Abdominal:     Palpations: Abdomen is soft.  Musculoskeletal:        General: Normal range of motion.     Cervical back: Normal range  of motion.  Skin:    General: Skin is warm.  Neurological:     General: No focal deficit present.     Mental Status: He is alert and oriented to person, place, and time.  Psychiatric:        Behavior: Behavior normal.        Judgment: Judgment normal.     LABORATORY DATA:  I have reviewed the data as listed Lab Results  Component Value Date   WBC 193.4 (HH) 10/20/2022   HGB 9.4 (L) 10/20/2022   HCT 31.0 (L) 10/20/2022   MCV 96.0 10/20/2022   PLT 147 (L) 10/20/2022   Recent Labs    09/02/22 1014 10/20/22 0901 12/17/22 1438  NA 140 140 142  K 4.3 4.4 4.0  CL 109 109 109  CO2 25 26 25   GLUCOSE 104* 107* 113*  BUN 23* 26* 24*  CREATININE 1.13 1.09 1.23  CALCIUM 9.2 9.0 9.4  GFRNONAA >60 >60 >60  PROT 6.9 7.0 6.7  ALBUMIN 4.3 4.3 4.2  AST 29 23 24   ALT 20 15 20   ALKPHOS 100 122 107  BILITOT 0.9 1.0 1.2*    RADIOGRAPHIC STUDIES: I have personally reviewed the radiological images as listed and agreed with the findings in the report. No results found.  CLL (chronic lymphocytic  leukemia) (HCC) #CLL-CD38 positive stage III [retroperitoneal lymph nodes]-Asymptomatic. IGVH- UNMUTATED; ? FISH trisomy 31; JULY  FISH panel- 12 trisomy-   APRIL 15th, 2024- Stable lymphadenopathy involving the chest, abdomen and pelvis. No new progressive findings;  however, Subsequent interval enlargement of the spleen.   # Today white count is 193 000 hemoglobin 9.5  platelets-140s.  Patient continues to be asymptomatic clinically-except for mild night sweats episodes,  weight loss  [see below]  Again, treatment will be recommended only if patient is significantly symptomatic.   Overa ll stable.   # I again at length I reviewed symptoms triggering treatment would be-profuse night sweats; weight loss; drop in his hemoglobin/platelets; frequent infections; significant lymphadenopathy etc. discussed with the patient that since his doubling time is 6 months-he might need treatment sooner.  But does not merit any treatment at this time.  # I again reviewed and at length discussed the treatment options include antibody therapy/fusions [Gazya x 6 months] + BTK inhibitors/pills [x one a day pill; indefinitely] vs Gazyva+ venatoclax.   # Weight loss- ? Combination of healthy life style, and progressive disease- monitor closely- as patient feels "healthy otherwise"  #History of urinary retention /prostatomegaly /prostate cancer  [Dr.Brandon]incidental-  2.1 x 0.9 cm stone or calcified mucosal lesion posterior left bladder wall in the region of the left UVJ.  Dr.Brandon. stable.   # CKD stage II-III-  stable.   # MS clinically stable.  # Secondary cancers: colo-[march 2024; [place]]; recommend surveillaince with dermatology. Will refer  # Vaccination: discussed re: Flu shots; pneumonia vaccination [s/p Shigles vacciation- in past]  # DISPOSITION: #  dermatology referral re: Risk of skin cancer/hx of CLL # note to work # follow up in 2 month- MD; labs- cbc/cmp;LDH; uric acid; reticulocyte count-    Dr.B   All questions were answered. The patient knows to call the clinic with any problems, questions or concerns.       Earna Coder, MD 12/17/2022 3:08 PM

## 2022-12-17 NOTE — Telephone Encounter (Signed)
Clinical Pharmacist Practitioner Encounter   Received new prescription for Venclexta (venetoclax) for the treatment of CLL, previously in observation, in conjunction with obinutuzumab, planned duration of 1 year. Panned start with cycle 2 of treatment, currently scheduled for 02/15/22.  CMP from 12/17/22 assessed, no relevant lab abnormalities. Prescription dose and frequency assessed.   Current medication list in Epic reviewed, no DDIs with venetoclax identified.  Evaluated chart and no patient barriers to medication adherence identified.   Prescription has been e-scribed to the [facility] for benefits analysis and approval.  Oral Oncology Clinic will continue to follow for insurance authorization, copayment issues, initial counseling and start date.   Remi Haggard, PharmD, BCPS, BCOP, CPP Hematology/Oncology Clinical Pharmacist Practitioner [place] [place] [phone]  12/17/2022 3:36 PM

## 2022-12-17 NOTE — Telephone Encounter (Signed)
Oral Oncology Patient Advocate Encounter  New authorization   Received notification that prior authorization for Venclexta is required.   PA submitted on 12/17/22  Key [id number]  Status is pending     Ardeen Fillers, CPhT Oncology Pharmacy Patient Advocate  [facility]  [phone] (phone) [phone] (fax) 12/17/2022 4:12 PM

## 2022-12-17 NOTE — Assessment & Plan Note (Addendum)
#  CLL-CD38 positive stage III [retroperitoneal lymph nodes]-Asymptomatic. IGVH- UNMUTATED; ? FISH trisomy 54; JULY  FISH panel- 12 trisomy-   APRIL 15th, 2024- Stable lymphadenopathy involving the chest, abdomen and pelvis. No new progressive findings;  however, Subsequent interval enlargement of the spleen.   # Today white count is 193 000 hemoglobin 9.5  platelets-140s.  Patient continues to be asymptomatic clinically-except for mild night sweats episodes,  weight loss  [see below]  Again, treatment will be recommended only if patient is significantly symptomatic.   Overa ll stable.   # I again at length I reviewed symptoms triggering treatment would be-profuse night sweats; weight loss; drop in his hemoglobin/platelets; frequent infections; significant lymphadenopathy etc. discussed with the patient that since his doubling time is 6 months-he might need treatment sooner.  But does not merit any treatment at this time.  # I again reviewed and at length discussed the treatment options include antibody therapy/fusions [Gazya x 6 months] + BTK inhibitors/pills [x one a day pill; indefinitely] vs Gazyva+ venatoclax.   # Weight loss- ? Combination of healthy life style, and progressive disease- monitor closely- as patient feels "healthy otherwise"  #History of urinary retention /prostatomegaly /prostate cancer  [Dr.Brandon]incidental-  2.1 x 0.9 cm stone or calcified mucosal lesion posterior left bladder wall in the region of the left UVJ.  Dr.Brandon. stable.   # CKD stage II-III-  stable.   # MS clinically stable.  # Secondary cancers: colo-[march 2024; [place]]; recommend surveillaince with dermatology. Will refer  # Vaccination: discussed re: Flu shots; pneumonia vaccination [s/p Shigles vacciation- in past]  # DISPOSITION: # chemo education- re: gazyva+ venatoclax # note to work # as per IS  follow up on JAN 13th- MD; CT CAP- labs- cbc/cmp;LDH; HOLD tube; uric acid; reticulocyte count;  immunoglobulin; D-1 Shelly Bombard; D-2 Shelly Bombard;  # day 8- MD; labs- cbc/cmp; Shelly Bombard # Day 15-MD; labs- cbc/cmp; Shelly Bombard-  Dr.B

## 2022-12-17 NOTE — Telephone Encounter (Signed)
Oral Oncology Patient Advocate Encounter  Prior Authorization for Ralph [name] has been approved.    PA# [id number]  Effective dates: 12/17/22 through 12/17/23  Patients co-pay is $85.00.   Co-pay card obtained to make co-pay ~$0.00.   Ardeen Fillers, CPhT Oncology Pharmacy Patient Advocate  [facility]  [phone] (phone) [phone] (fax) 12/17/2022 4:13 PM

## 2022-12-17 NOTE — Progress Notes (Unsigned)
Critical WBC from [facility] in lab; 223.8. Read Back. Dr Donneta Romberg notified.

## 2022-12-17 NOTE — Progress Notes (Signed)
START ON PATHWAY REGIMEN - Lymphoma and CLL     Cycle 1: A cycle is 28 days:     Venetoclax      Obinutuzumab      Obinutuzumab      Obinutuzumab    Cycle 2: A cycle is 28 days:     Venetoclax      Venetoclax      Venetoclax      Venetoclax      Obinutuzumab    Cycles 3 through 6: A cycle is 28 days:     Venetoclax      Obinutuzumab    Cycles 7 through 12: A cycle is 28 days:     Venetoclax   **Always confirm dose/schedule in your pharmacy ordering system**  Patient Characteristics: Chronic Lymphocytic Leukemia (CLL), Treatment Indicated, First Line Disease Type: Chronic Lymphocytic Leukemia (CLL) Disease Type: Not Applicable Disease Type: Not Applicable Treatment Indicated<= Treatment Indicated Line of Therapy: First Line Intent of Therapy: Non-Curative / Palliative Intent, Discussed with Patient

## 2022-12-18 ENCOUNTER — Other Ambulatory Visit: Payer: Self-pay

## 2022-12-18 ENCOUNTER — Telehealth: Payer: Self-pay

## 2022-12-18 ENCOUNTER — Encounter: Payer: Self-pay | Admitting: Internal Medicine

## 2022-12-18 ENCOUNTER — Other Ambulatory Visit (HOSPITAL_COMMUNITY): Payer: Self-pay

## 2022-12-18 NOTE — Telephone Encounter (Signed)
Oral Oncology Patient Advocate Encounter   Was successful in obtaining a copay card for Venclexta.  This copay card will make the patients copay ~0.00.  The billing information is as follows and has been shared with [facility].   RxBin: [id number] PCN: [id number] Member ID: [id number] Group ID: [id number]   Ardeen Fillers, CPhT Oncology Pharmacy Patient Advocate  [facility]  [phone] (phone) [phone] (fax) 12/18/2022 11:40 AM

## 2022-12-23 NOTE — Telephone Encounter (Signed)
Plan is start about first week of February. Reminder has been set to call patient to [employer] and set delivery on 02/02/23. Closing out encounter.    Ardeen Fillers, CPhT Oncology Pharmacy Patient Advocate  [facility]  [phone] (phone) [phone] (fax) 12/23/2022 9:55 AM

## 2022-12-24 ENCOUNTER — Ambulatory Visit
Admission: RE | Admit: 2022-12-24 | Discharge: 2022-12-24 | Disposition: A | Discharge status: Home / Self Care | Payer: Federal, State, Local not specified - PPO | Source: Ambulatory Visit | Attending: Internal Medicine | Admitting: Internal Medicine

## 2022-12-24 DIAGNOSIS — C9110 Chronic lymphocytic leukemia of B-cell type not having achieved remission: Secondary | ICD-10-CM | POA: Insufficient documentation

## 2022-12-24 DIAGNOSIS — K5730 Diverticulosis of large intestine without perforation or abscess without bleeding: Secondary | ICD-10-CM | POA: Diagnosis not present

## 2022-12-24 DIAGNOSIS — N400 Enlarged prostate without lower urinary tract symptoms: Secondary | ICD-10-CM | POA: Diagnosis not present

## 2022-12-24 DIAGNOSIS — R591 Generalized enlarged lymph nodes: Secondary | ICD-10-CM | POA: Diagnosis not present

## 2022-12-24 MED ORDER — IOHEXOL 300 MG/ML  SOLN
100.0000 mL | Freq: Once | INTRAMUSCULAR | Status: AC | PRN
  Administered 2022-12-24: 100 mL via INTRAVENOUS

## 2023-01-02 ENCOUNTER — Inpatient Hospital Stay: Payer: Federal, State, Local not specified - PPO

## 2023-01-12 ENCOUNTER — Encounter: Payer: Self-pay | Admitting: Internal Medicine

## 2023-01-13 ENCOUNTER — Encounter: Payer: Self-pay | Admitting: Internal Medicine

## 2023-01-14 ENCOUNTER — Encounter: Payer: Self-pay | Admitting: Internal Medicine

## 2023-01-14 ENCOUNTER — Telehealth: Payer: Self-pay | Admitting: *Deleted

## 2023-01-14 NOTE — Telephone Encounter (Signed)
 I called the pt and let him know that thr form was faxed to the number given and he has a copy at front desk that is his copy and he can get it

## 2023-01-14 NOTE — Progress Notes (Signed)
 Opened in error. Ebony Hail, Pharm.D., CPP 01/21/2023@2 :31 PM

## 2023-01-19 ENCOUNTER — Encounter: Payer: Self-pay | Admitting: Internal Medicine

## 2023-01-19 ENCOUNTER — Inpatient Hospital Stay: Payer: BC Managed Care – PPO | Attending: Internal Medicine

## 2023-01-19 ENCOUNTER — Inpatient Hospital Stay (HOSPITAL_BASED_OUTPATIENT_CLINIC_OR_DEPARTMENT_OTHER): Payer: BC Managed Care – PPO | Admitting: Internal Medicine

## 2023-01-19 DIAGNOSIS — C9110 Chronic lymphocytic leukemia of B-cell type not having achieved remission: Secondary | ICD-10-CM | POA: Diagnosis not present

## 2023-01-19 DIAGNOSIS — Z5112 Encounter for antineoplastic immunotherapy: Secondary | ICD-10-CM | POA: Diagnosis not present

## 2023-01-19 LAB — CBC WITH DIFFERENTIAL (CANCER CENTER ONLY)
Abs Immature Granulocytes: 0.63 10*3/uL — ABNORMAL HIGH (ref 0.00–0.07)
Basophils Absolute: 0.1 10*3/uL (ref 0.0–0.1)
Basophils Relative: 0 %
Eosinophils Absolute: 0.3 10*3/uL (ref 0.0–0.5)
Eosinophils Relative: 0 %
HCT: 27.8 % — ABNORMAL LOW (ref 39.0–52.0)
Hemoglobin: 8.3 g/dL — ABNORMAL LOW (ref 13.0–17.0)
Immature Granulocytes: 0 %
Lymphocytes Relative: 76 %
Lymphs Abs: 121.8 10*3/uL — ABNORMAL HIGH (ref 0.7–4.0)
MCH: 29.3 pg (ref 26.0–34.0)
MCHC: 29.9 g/dL — ABNORMAL LOW (ref 30.0–36.0)
MCV: 98.2 fL (ref 80.0–100.0)
Monocytes Absolute: 35.9 10*3/uL — ABNORMAL HIGH (ref 0.1–1.0)
Monocytes Relative: 22 %
Neutro Abs: 3.4 10*3/uL (ref 1.7–7.7)
Neutrophils Relative %: 2 %
Platelet Count: 138 10*3/uL — ABNORMAL LOW (ref 150–400)
RBC: 2.83 MIL/uL — ABNORMAL LOW (ref 4.22–5.81)
RDW: 19.1 % — ABNORMAL HIGH (ref 11.5–15.5)
Smear Review: NORMAL
WBC Count: 162.2 10*3/uL (ref 4.0–10.5)
nRBC: 0.2 % (ref 0.0–0.2)

## 2023-01-19 LAB — CMP (CANCER CENTER ONLY)
ALT: 17 U/L (ref 0–44)
AST: 27 U/L (ref 15–41)
Albumin: 4.1 g/dL (ref 3.5–5.0)
Alkaline Phosphatase: 100 U/L (ref 38–126)
Anion gap: 10 (ref 5–15)
BUN: 23 mg/dL — ABNORMAL HIGH (ref 6–20)
CO2: 26 mmol/L (ref 22–32)
Calcium: 9.1 mg/dL (ref 8.9–10.3)
Chloride: 104 mmol/L (ref 98–111)
Creatinine: 1.19 mg/dL (ref 0.61–1.24)
GFR, Estimated: >60 mL/min (ref >60)
Glucose, Bld: 151 mg/dL — ABNORMAL HIGH (ref 70–99)
Potassium: 4 mmol/L (ref 3.5–5.1)
Sodium: 140 mmol/L (ref 135–145)
Total Bilirubin: 1.1 mg/dL (ref 0.0–1.2)
Total Protein: 6.7 g/dL (ref 6.5–8.1)

## 2023-01-19 LAB — RETICULOCYTES
Immature Retic Fract: 34.4 % — ABNORMAL HIGH (ref 2.3–15.9)
RBC.: 2.82 MIL/uL — ABNORMAL LOW (ref 4.22–5.81)
Retic Count, Absolute: 76.1 10*3/uL (ref 19.0–186.0)
Retic Ct Pct: 2.7 % (ref 0.4–3.1)

## 2023-01-19 LAB — LACTATE DEHYDROGENASE: LDH: 354 U/L — ABNORMAL HIGH (ref 98–192)

## 2023-01-19 LAB — URIC ACID: Uric Acid, Serum: 8.8 mg/dL — ABNORMAL HIGH (ref 3.7–8.6)

## 2023-01-19 LAB — SAMPLE TO BLOOD BANK

## 2023-01-19 MED ORDER — ONDANSETRON HCL 8 MG PO TABS: ORAL_TABLET | ORAL | 1 refills | Status: AC

## 2023-01-19 MED ORDER — SULFAMETHOXAZOLE-TRIMETHOPRIM 800-160 MG PO TABS
ORAL_TABLET | ORAL | 1 refills | Status: DC
Start: 2023-01-19 — End: 2023-06-03

## 2023-01-19 MED ORDER — ALLOPURINOL 300 MG PO TABS: 300.0000 mg | ORAL_TABLET | Freq: Two times a day (BID) | ORAL | 4 refills | Status: DC

## 2023-01-19 MED ORDER — PROCHLORPERAZINE MALEATE 10 MG PO TABS: 10.0000 mg | ORAL_TABLET | Freq: Four times a day (QID) | ORAL | 1 refills | Status: AC | PRN

## 2023-01-19 MED ORDER — ACYCLOVIR 400 MG PO TABS: 400.0000 mg | ORAL_TABLET | Freq: Two times a day (BID) | ORAL | 4 refills | Status: DC

## 2023-01-19 MED FILL — Dexamethasone Sodium Phosphate Inj 100 MG/10ML: INTRAMUSCULAR | Qty: 2 | Status: AC

## 2023-01-19 NOTE — Progress Notes (Signed)
 [facility] CONSULT NOTE  Patient Care Team: [name], MD as PCP - General (Family Medicine) [name], MD as Consulting Physician (Oncology)  CHIEF COMPLAINTS/PURPOSE OF CONSULTATION: CLL  Oncology History Overview Note  Chronic lymphocytic leukemia (CLL), positive for CD20, CD22, CD19 and  CD38,  see comment.   ANNOTATION COMMENT IMP Comment VC   Comment: (NOTE)  Expression of CD38 on >30% of the clonal B-cells is reported to be an  unfavorable prognostic factor in CLL.     # CLL stage I- [lymphocytosis/retroperitoneal lymphadenopathy up to 2.5 cm-incidental CT protocol- 2023];   APRIL 2024- consistent with trisomy 12. Results for  CCND1/IGH, ATM, 13q and TP53 were normal.   # JAN 14th, 2024- Gazyva + plan to add Venatoclax with cycle 2 or 3.   #GEN 2023-bilateral severe hydronephrosis-bladder outlet obstruction/status post catheter [Dr.Brandon]  #History of prostate cancer under surveillance [[facility]]   CLL (chronic lymphocytic leukemia) (HCC)  02/11/2021 Initial Diagnosis   CLL (chronic lymphocytic leukemia) (HCC)   01/19/2023 -  Chemotherapy   Patient is on Treatment Plan : LYMPHOMA CLL/SLL Venetoclax  + Obinutuzumab  q28d      HISTORY OF PRESENTING ILLNESS: Alone.  Ambulating independently.  Ralph [name] 58 y.o.  male history of multiple sclerosis-not on active therapy; prostate cancer on surveillance; CLL currently on surveillance is here for a follow-up.  Appetite is good. However notes to have weight loss. States eating differently [cleaner food].  Patient admits to episode of intermittent sweats.  However they are not drenching.   Denies any lumps or bumps.  Denies any fevers or chills.  No recent infections.  Review of Systems  Constitutional:  Positive for weight loss. Negative for chills, diaphoresis, fever and malaise/fatigue.  HENT:  Negative for nosebleeds and sore throat.   Eyes:  Negative for double vision.   Respiratory:  Negative for cough, hemoptysis, sputum production, shortness of breath and wheezing.   Cardiovascular:  Negative for chest pain, palpitations, orthopnea and leg swelling.  Gastrointestinal:  Negative for abdominal pain, blood in stool, constipation, diarrhea, heartburn, melena, nausea and vomiting.  Genitourinary:  Negative for dysuria, frequency and urgency.  Musculoskeletal:  Negative for back pain and joint pain.  Skin: Negative.  Negative for itching and rash.  Neurological:  Negative for dizziness, tingling, focal weakness, weakness and headaches.  Endo/Heme/Allergies:  Does not bruise/bleed easily.  Psychiatric/Behavioral:  Negative for depression. The patient is not nervous/anxious and does not have insomnia.      MEDICAL HISTORY:  Past Medical History:  Diagnosis Date   Acute renal failure (HCC)    Anxiety    Bladder outlet obstruction    CLL (chronic lymphocytic leukemia) (HCC)    Elevated PSA    Foreign body of bladder    MS (multiple sclerosis) (HCC)    Pre-diabetes    Prostate cancer (HCC) 2013   UTI (urinary tract infection)     SURGICAL HISTORY: Past Surgical History:  Procedure Laterality Date   CYSTOSCOPY N/A 12/16/2021   Procedure: CYSTOSCOPY WITH REMOVAL OF FOREIGN BODY;  Surgeon: Penne Knee, MD;  Location: [facility] ORS;  Service: Urology;  Laterality: N/A;   HOLEP-LASER ENUCLEATION OF THE PROSTATE WITH MORCELLATION N/A 03/11/2021   Procedure: HOLEP-LASER ENUCLEATION OF THE PROSTATE WITH MORCELLATION;  Surgeon: Penne Knee, MD;  Location: [facility] ORS;  Service: Urology;  Laterality: N/A;   KNEE SURGERY  2002   meniscus   TRANSURETHRAL RESECTION OF PROSTATE N/A 12/16/2021   Procedure: TRANSURETHRAL RESECTION OF THE  PROSTATE (TURP);  Surgeon: Penne Knee, MD;  Location: [facility] ORS;  Service: Urology;  Laterality: N/A;    SOCIAL HISTORY: Social History   Socioeconomic History   Marital status: Single    Spouse name: Not on file   Number of  children: 0   Years of education: College   Highest education level: Not on file  Occupational History   Occupation: Research Officer, [employer])  Tobacco Use   Smoking status: Never    Passive exposure: Never   Smokeless tobacco: Never  Vaping Use   Vaping status: Never Used  Substance and Sexual Activity   Alcohol use: No    Comment: Former alcohol consumption   Drug use: No   Sexual activity: Not Currently  Other Topics Concern   Not on file  Social History Narrative   Lives alone   Caffeine use: Diet coke/pepsi/Mt Dew   Right handed       Denies history of smoking.  Denies any alcohol; lives in [place].    Social Drivers of Health   Financial Resource Strain: Low Risk  (08/27/2022)   Received from [facility]   Overall Financial Resource Strain (CARDIA)    Difficulty of Paying Living Expenses: Not hard at all  Food Insecurity: No Food Insecurity (08/27/2022)   Received from [facility]   Hunger Vital Sign    Worried About Running Out of Food in the Last Year: Never true    Ran Out of Food in the Last Year: Never true  Transportation Needs: No Transportation Needs (08/27/2022)   Received from [facility] - Transportation    In the past 12 months, has lack of transportation kept you from medical appointments or from getting medications?: No    Lack of Transportation (Non-Medical): No  Physical Activity: Not on file  Stress: Not on file  Social Connections: Not on file  Intimate Partner Violence: Not on file    FAMILY HISTORY: Family History  Problem Relation Age of Onset   Cancer Sister        breast CA     ALLERGIES:  has no known allergies.  MEDICATIONS:  Current Outpatient Medications  Medication Sig Dispense Refill   acyclovir  (ZOVIRAX ) 400 MG tablet Take 1 tablet (400 mg total) by mouth 2 (two) times daily. 60 tablet 4   allopurinol  (ZYLOPRIM ) 300 MG tablet Take 1 tablet (300 mg total) by  mouth 2 (two) times daily. 60 tablet 4   Multiple Vitamin (MULTIVITAMIN) tablet Take 1 tablet by mouth daily.     ondansetron  (ZOFRAN ) 8 MG tablet One pill every 8 hours as needed for nausea/vomitting. 40 tablet 1   prochlorperazine  (COMPAZINE ) 10 MG tablet Take 1 tablet (10 mg total) by mouth every 6 (six) hours as needed for nausea or vomiting. 40 tablet 1   rosuvastatin  (CRESTOR ) 5 MG tablet Take 1 tablet by mouth at bedtime.     sulfamethoxazole -trimethoprim  (BACTRIM  DS) 800-160 MG tablet One pill on Monday;wed;Friday 30 tablet 1   No current facility-administered medications for this visit.      SABRA  PHYSICAL EXAMINATION:  Vitals:   01/19/23 1506  BP: 124/77  Pulse: 90  Temp: 98.1 F (36.7 C)  SpO2: 100%    Filed Weights   01/19/23 1506  Weight: 194 lb 3.2 oz (88.1 kg)    Shotty bilateral neck adenopathy.  1 to 2 cm lymph node bilateral axilla left more than right.  Mild lymphadenopathy  in the right inguinal region. Mild splenomegaly- 4 fingers breadth below the costal margin. ; hepatomegaly.   Physical Exam Vitals and nursing note reviewed.  HENT:     Head: Normocephalic and atraumatic.     Mouth/Throat:     Pharynx: Oropharynx is clear.  Eyes:     Extraocular Movements: Extraocular movements intact.     Pupils: Pupils are equal, round, and reactive to light.  Cardiovascular:     Rate and Rhythm: Normal rate and regular rhythm.  Pulmonary:     Comments: Decreased breath sounds bilaterally.  Abdominal:     Palpations: Abdomen is soft.  Musculoskeletal:        General: Normal range of motion.     Cervical back: Normal range of motion.  Skin:    General: Skin is warm.  Neurological:     General: No focal deficit present.     Mental Status: He is alert and oriented to person, place, and time.  Psychiatric:        Behavior: Behavior normal.        Judgment: Judgment normal.     LABORATORY DATA:  I have reviewed the data as listed Lab Results  Component  Value Date   WBC 162.2 (HH) 01/19/2023   HGB 8.3 (L) 01/19/2023   HCT 27.8 (L) 01/19/2023   MCV 98.2 01/19/2023   PLT 138 (L) 01/19/2023   Recent Labs    10/20/22 0901 12/17/22 1438 01/19/23 1446  NA 140 142 140  K 4.4 4.0 4.0  CL 109 109 104  CO2 26 25 26   GLUCOSE 107* 113* 151*  BUN 26* 24* 23*  CREATININE 1.09 1.23 1.19  CALCIUM  9.0 9.4 9.1  GFRNONAA >60 >60 >60  PROT 7.0 6.7 6.7  ALBUMIN 4.3 4.2 4.1  AST 23 24 27   ALT 15 20 17   ALKPHOS 122 107 100  BILITOT 1.0 1.2* 1.1    RADIOGRAPHIC STUDIES: I have personally reviewed the radiological images as listed and agreed with the findings in the report. CT CHEST ABDOMEN PELVIS W CONTRAST Result Date: 12/25/2022 CLINICAL DATA:  History of CLL, restaging/follow-up. * Tracking Code: BO * EXAM: CT CHEST, ABDOMEN, AND PELVIS WITH CONTRAST TECHNIQUE: Multidetector CT imaging of the chest, abdomen and pelvis was performed following the standard protocol during bolus administration of intravenous contrast. RADIATION DOSE REDUCTION: This exam was performed according to the departmental dose-optimization program which includes automated exposure control, adjustment of the mA and/or kV according to patient size and/or use of iterative reconstruction technique. CONTRAST:  OMNIPAQUE  IOHEXOL  300 MG/ML  SOLN COMPARISON:  Multiple priors including most recent CT April 18, 2022 FINDINGS: CT CHEST FINDINGS Cardiovascular: No significant vascular findings. Normal heart size. No pericardial effusion. Mediastinum/Nodes: Enlarged supraclavicular, bilateral axillary, subpectoral and mediastinal lymph nodes are similar prior examination. For reference: -a left axillary lymph node measures 11 mm in short axis on image 17/2, unchanged. -retroesophageal lymph node measures 17 mm in short axis on image 27/2, unchanged. No suspicious thyroid nodule. Lungs/Pleura: No suspicious pulmonary nodules or masses. No pleural effusion. No pneumothorax. Musculoskeletal:  No aggressive lytic or blastic lesion of bone. CT ABDOMEN PELVIS FINDINGS Hepatobiliary: No suspicious hepatic lesion. Similar mild hepatomegaly. Gallbladder is unremarkable. No biliary ductal dilation. Pancreas: No pancreatic ductal dilation or evidence of acute inflammation. Spleen: Splenomegaly measuring 20 cm in maximum axial dimension, unchanged. Adrenals/Urinary Tract: No suspicious adrenal mass. Kidneys demonstrate symmetric enhancement. Urinary bladder is minimally distended limiting evaluation. Enlarged prostate gland with median  lobe hypertrophy effaces the urinary bladder Stomach/Bowel: No radiopaque enteric contrast material was administered. Normal appendix. Colonic diverticulosis without findings of acute diverticulitis. Vascular/Lymphatic: Aortic atherosclerosis.  Smooth IVC contours. Enlarged retroperitoneal, mesenteric, pelvic sidewall and inguinal lymph nodes are similar prior examination. No definite progressive abdominopelvic adenopathy. For reference: Central mesenteric lymph node measures 11 mm in short axis on image 75/2, unchanged. -left pelvic sidewall lymph node measures 13 mm in short axis on image 109/2 previously 12 mm -right pelvic sidewall lymph node measures 17 mm in short axis on image 108/2 previously 16 mm when remeasured for consistency. Reproductive: Prostatomegaly with median lobe hypertrophy. Other: No significant abdominopelvic free fluid. Musculoskeletal: No aggressive lytic or blastic lesion of bone. Stable likely fibrous dysplasia in the left acetabulum. IMPRESSION: 1. Stable splenomegaly and adenopathy in the chest, abdomen and pelvis. 2. Stable mild hepatomegaly. 3. Prostatomegaly with median lobe hypertrophy effaces the urinary bladder. 4. Colonic diverticulosis without findings of acute diverticulitis. 5.  Aortic Atherosclerosis (ICD10-I70.0). Electronically Signed   By: Reyes Holder M.D.   On: 12/25/2022 16:26    CLL (chronic lymphocytic leukemia) (HCC) #CLL-CD38  positive stage III [retroperitoneal lymph nodes]-Asymptomatic. IGVH- UNMUTATED; JULY  FISH panel- 12 trisomy-   DEC 18th, 2024-  up to 17 mm- Stable lymphadenopathy involving the chest, abdomen and pelvis. enlargement of the spleen.   # Clinical progressive with white count about 160s-200,000; hemoglobin 8.3 platelets in the 120-140ss.  Patient  is continues to be worsening fatigue and also ongoing weight loss.  # Plan to proceed with fixed duration therapy with Gazyva  + Venatoclax.  Will plan to start venetoclax  after 1 or 2 cycles of Gazyva  given risk of TLS/to avoid admission to the hospital.  Labs reviewed today.  Proceed with the GAzyva   tomorrow.  #History of urinary retention /prostatomegaly /prostate cancer  [Dr.Brandon]incidental-  2.1 x 0.9 cm stone or calcified mucosal lesion posterior left bladder wall in the region of the left UVJ.  Dr.Brandon. stable.   # CKD stage II-III-  stable.   # MS clinically stable.  # Secondary cancers: colo-[march 2024; [place]]; recommend surveillaince with dermatology. Will refer  # Vaccination: discussed re: Flu shots; pneumonia vaccination [s/p Shigles vacciation- in past]  # Hx of Gout Elevated uric acid 9.5-second of CLL-on allopurinol  recommend increasing dose to 300 BID  # ID prophylaxis: recommend Bactrim  DS; and Acyclovir .   # DISPOSITION: # as planned - tomorrow D-1 Gazyva ; and D-2 Gazyva ;  # in  1 week MD; labs- cbc/cmp;LDH; mag; phos;uric acid Gazyva  # in 2 weeks-MD; labs- cbc/cmp; LDH;  mag; phos;uric acid   Gazyva -  Dr.B    All questions were answered. The patient knows to call the clinic with any problems, questions or concerns.       Cindy JONELLE Joe, MD 01/19/2023 3:59 PM

## 2023-01-19 NOTE — Assessment & Plan Note (Addendum)
#  CLL-CD38 positive stage III [retroperitoneal lymph nodes]-Asymptomatic. IGVH- UNMUTATED; JULY  FISH panel- 12 trisomy-   DEC 18th, 2024-  up to 17 mm- Stable lymphadenopathy involving the chest, abdomen and pelvis. enlargement of the spleen.   # Clinical progressive with white count about 160s-200,000; hemoglobin 8.3 platelets in the 120-140ss.  Patient  is continues to be worsening fatigue and also ongoing weight loss.  # Plan to proceed with fixed duration therapy with Gazyva  + Venatoclax.  Will plan to start venetoclax  after 1 or 2 cycles of Gazyva  given risk of TLS/to avoid admission to the hospital.  Labs reviewed today.  Proceed with the GAzyva   tomorrow.  #History of urinary retention /prostatomegaly /prostate cancer  [Dr.Brandon]incidental-  2.1 x 0.9 cm stone or calcified mucosal lesion posterior left bladder wall in the region of the left UVJ.  Dr.Brandon. stable.   # CKD stage II-III-  stable.   # MS clinically stable.  # Secondary cancers: colo-[march 2024; [place]]; recommend surveillaince with dermatology. Will refer  # Vaccination: discussed re: Flu shots; pneumonia vaccination [s/p Shigles vacciation- in past]  # Hx of Gout Elevated uric acid 9.5-second of CLL-on allopurinol  recommend increasing dose to 300 BID  # ID prophylaxis: recommend Bactrim  DS; and Acyclovir .   # DISPOSITION: # as planned - tomorrow D-1 Gazyva ; and D-2 Gazyva ;  # in  1 week MD; labs- cbc/cmp;LDH; mag; phos;uric acid Gazyva  # in 2 weeks-MD; labs- cbc/cmp; LDH;  mag; phos;uric acid   Gazyva -  Dr.B

## 2023-01-19 NOTE — Progress Notes (Signed)
 [id number] ~Critical lab called by [facility] in lab. Read back. Dr Donneta Romberg notified of results.

## 2023-01-19 NOTE — Progress Notes (Signed)
 CT CAP 12/24/22.  Had his covid, flu and rsv vaccines 01/15/23.

## 2023-01-20 ENCOUNTER — Telehealth: Payer: Self-pay

## 2023-01-20 ENCOUNTER — Other Ambulatory Visit: Payer: Self-pay | Admitting: Internal Medicine

## 2023-01-20 ENCOUNTER — Inpatient Hospital Stay: Payer: BC Managed Care – PPO

## 2023-01-20 ENCOUNTER — Encounter: Payer: Self-pay | Admitting: Internal Medicine

## 2023-01-20 VITALS — BP 103/62 | HR 90 | Temp 100.5°F | Resp 16

## 2023-01-20 DIAGNOSIS — C9110 Chronic lymphocytic leukemia of B-cell type not having achieved remission: Secondary | ICD-10-CM | POA: Diagnosis not present

## 2023-01-20 DIAGNOSIS — Z5112 Encounter for antineoplastic immunotherapy: Secondary | ICD-10-CM | POA: Diagnosis not present

## 2023-01-20 MED ORDER — DEXAMETHASONE SODIUM PHOSPHATE 100 MG/10ML IJ SOLN
20.0000 mg | Freq: Once | INTRAMUSCULAR | Status: AC
  Administered 2023-01-20: 20 mg via INTRAVENOUS
  Filled 2023-01-20: qty 20

## 2023-01-20 MED ORDER — SODIUM CHLORIDE 0.9 % IV SOLN
Freq: Once | INTRAVENOUS | Status: DC | PRN
  Filled 2023-01-20: qty 250

## 2023-01-20 MED ORDER — SODIUM CHLORIDE 0.9 % IV SOLN
100.0000 mg | Freq: Once | INTRAVENOUS | Status: AC
  Administered 2023-01-20: 100 mg via INTRAVENOUS
  Filled 2023-01-20: qty 4

## 2023-01-20 MED ORDER — METHYLPREDNISOLONE SODIUM SUCC 125 MG IJ SOLR
50.0000 mg | Freq: Once | INTRAMUSCULAR | Status: AC
Start: 2023-01-20 — End: 2023-01-20
  Administered 2023-01-20: 50 mg via INTRAVENOUS

## 2023-01-20 MED ORDER — SODIUM CHLORIDE 0.9 % IV SOLN
INTRAVENOUS | Status: DC
Start: 2023-01-20 — End: 2023-01-21
  Filled 2023-01-20 (×2): qty 250

## 2023-01-20 MED ORDER — MONTELUKAST SODIUM 10 MG PO TABS
10.0000 mg | ORAL_TABLET | Freq: Once | ORAL | Status: AC
  Administered 2023-01-20: 10 mg via ORAL

## 2023-01-20 MED ORDER — ACETAMINOPHEN 325 MG PO TABS
650.0000 mg | ORAL_TABLET | Freq: Once | ORAL | Status: AC
  Administered 2023-01-20: 650 mg via ORAL

## 2023-01-20 MED ORDER — MONTELUKAST SODIUM 10 MG PO TABS
10.0000 mg | ORAL_TABLET | Freq: Every day | ORAL | Status: DC
Start: 2023-01-20 — End: 2023-01-20

## 2023-01-20 MED ORDER — FAMOTIDINE IN NACL 20-0.9 MG/50ML-% IV SOLN
20.0000 mg | Freq: Once | INTRAVENOUS | Status: AC | PRN
  Administered 2023-01-20: 20 mg via INTRAVENOUS

## 2023-01-20 MED ORDER — ONDANSETRON HCL 4 MG/2ML IJ SOLN
8.0000 mg | Freq: Once | INTRAMUSCULAR | Status: AC
  Administered 2023-01-20: 8 mg via INTRAVENOUS
  Filled 2023-01-20: qty 4

## 2023-01-20 MED ORDER — ACETAMINOPHEN 325 MG PO TABS
650.0000 mg | ORAL_TABLET | Freq: Once | ORAL | Status: AC
  Administered 2023-01-20: 650 mg via ORAL
  Filled 2023-01-20: qty 2

## 2023-01-20 MED ORDER — DIPHENHYDRAMINE HCL 50 MG/ML IJ SOLN
50.0000 mg | Freq: Once | INTRAMUSCULAR | Status: AC
  Administered 2023-01-20: 50 mg via INTRAVENOUS
  Filled 2023-01-20: qty 1

## 2023-01-20 MED FILL — Dexamethasone Sodium Phosphate Inj 100 MG/10ML: INTRAMUSCULAR | Qty: 2 | Status: AC

## 2023-01-20 NOTE — Patient Instructions (Signed)
 [facility] - A DEPT OF [facility]  Discharge Instructions: Thank you for choosing [facility] to provide your oncology and hematology care.  If you have a lab appointment with the Cancer Center, please go directly to the Cancer Center and check in at the registration area.  Wear comfortable clothing and clothing appropriate for easy access to any Portacath or PICC line.   We strive to give you quality time with your provider. You may need to reschedule your appointment if you arrive late (15 or more minutes).  Arriving late affects you and other patients whose appointments are after yours.  Also, if you miss three or more appointments without notifying the office, you may be dismissed from the clinic at the provider's discretion.      For prescription refill requests, have your pharmacy contact our office and allow 72 hours for refills to be completed.    Today you received the following chemotherapy and/or immunotherapy agents Gazyva       To help prevent nausea and vomiting after your treatment, we encourage you to take your nausea medication as directed.  BELOW ARE SYMPTOMS THAT SHOULD BE REPORTED IMMEDIATELY: *FEVER GREATER THAN 100.4 F (38 C) OR HIGHER *CHILLS OR SWEATING *NAUSEA AND VOMITING THAT IS NOT CONTROLLED WITH YOUR NAUSEA MEDICATION *UNUSUAL SHORTNESS OF BREATH *UNUSUAL BRUISING OR BLEEDING *URINARY PROBLEMS (pain or burning when urinating, or frequent urination) *BOWEL PROBLEMS (unusual diarrhea, constipation, pain near the anus) TENDERNESS IN MOUTH AND THROAT WITH OR WITHOUT PRESENCE OF ULCERS (sore throat, sores in mouth, or a toothache) UNUSUAL RASH, SWELLING OR PAIN  UNUSUAL VAGINAL DISCHARGE OR ITCHING   Items with * indicate a potential emergency and should be followed up as soon as possible or go to the Emergency Department if any problems should occur.  Please show the CHEMOTHERAPY ALERT CARD or IMMUNOTHERAPY  ALERT CARD at check-in to the Emergency Department and triage nurse.  Should you have questions after your visit or need to cancel or reschedule your appointment, please contact [facility] - A DEPT OF [facility]  [phone] and follow the prompts.  Office hours are 8:00 a.m. to 4:30 p.m. Monday - Friday. Please note that voicemails left after 4:00 p.m. may not be returned until the following business day.  We are closed weekends and major holidays. You have access to a nurse at all times for urgent questions. Please call the main number to the clinic [phone] and follow the prompts.  For any non-urgent questions, you may also contact your provider using MyChart. We now offer e-Visits for anyone 81 and older to request care online for non-urgent symptoms. For details visit [email or web address].   Also download the MyChart app! Go to the app store, search "MyChart", open the app, select [facility], and log in with your MyChart username and password.  Obinutuzumab Injection What is this medication? OBINUTUZUMAB (OH bi nue TOOZ ue mab) treats leukemia and lymphoma. It works by blocking a protein that causes cancer cells to grow and multiply. This helps to slow or stop the spread of cancer cells. It is a monoclonal antibody. This medicine may be used for other purposes; ask your health care provider or pharmacist if you have questions. COMMON BRAND NAME(S): GAZYVA What should I tell my care team before I take this medication? They need to know if you have any of these conditions: Heart disease Infection, especially  a viral infection, such as hepatitis B Lung or breathing disease Take medications that treat or prevent blood clots An unusual or allergic reaction to obinutuzumab, other medications, foods, dyes, or preservatives Pregnant or trying to get pregnant Breastfeeding How should I use this medication? This medication is for infusion into a vein. It  is given by a care team in a hospital or clinic setting. Talk to your care team about the use of this medication in children. Special care may be needed. Overdosage: If you think you have taken too much of this medicine contact a poison control center or emergency room at once. NOTE: This medicine is only for you. Do not share this medicine with others. What if I miss a dose? Keep appointments for follow-up doses as directed. It is important not to miss your dose. Call your care team if you are unable to keep an appointment. What may interact with this medication? Live virus vaccines This list may not describe all possible interactions. Give your health care provider a list of all the medicines, herbs, non-prescription drugs, or dietary supplements you use. Also tell them if you smoke, drink alcohol, or use illegal drugs. Some items may interact with your medicine. What should I watch for while using this medication? Report any side effects that you notice during your treatment right away, such as changes in your breathing, fever, chills, dizziness or lightheadedness. These effects are more common with the first dose. Visit your care team for checks on your progress. You will need to have regular blood work. Report any other side effects. The side effects of this medication can continue after you finish your treatment. Continue your course of treatment even though you feel ill unless your care team tells you to stop. Call your care team for advice if you get a fever, chills or sore throat, or other symptoms of a cold or flu. Do not treat yourself. This medication decreases your body's ability to fight infections. Try to avoid being around people who are sick. This medication may increase your risk to bruise or bleed. Call your care team if you notice any unusual bleeding. Do not become pregnant while taking this medication or for 6 months after stopping it. Inform your care team if you wish to become  pregnant or think you might be pregnant. There is a potential for serious side effects to an unborn child. Talk to your care team or pharmacist for more information. Do not breast-feed an infant while taking this medication or for 6 months after stopping it. What side effects may I notice from receiving this medication? Side effects that you should report to your care team as soon as possible: Allergic reactions--skin rash, itching, hives, swelling of the face, lips, tongue, or throat Bleeding--bloody or black, tar-like stools, vomiting blood or brown material that looks like coffee grounds, red or dark brown urine, small red or purple spots on skin, unusual bruising or bleeding Blood clot--pain, swelling, or warmth in the leg, shortness of breath, chest pain Dizziness, loss of balance or coordination, confusion or trouble speaking Infection--fever, chills, cough, sore throat, wounds that don't heal, pain or trouble when passing urine, general feeling of discomfort or being unwell Infusion reactions--chest pain, shortness of breath or trouble breathing, feeling faint or lightheaded Liver injury--right upper belly pain, loss of appetite, nausea, light-colored stool, dark yellow or brown urine, yellowing skin or eyes, unusual weakness or fatigue Tumor lysis syndrome (TLS)--nausea, vomiting, diarrhea, decrease in the amount of urine,  dark urine, unusual weakness or fatigue, confusion, muscle pain or cramps, fast or irregular heartbeat, joint pain Side effects that usually do not require medical attention (report to your care team if they continue or are bothersome): Bone, joint, or muscle pain Constipation Diarrhea Fatigue Runny or stuffy nose Sore throat This list may not describe all possible side effects. Call your doctor for medical advice about side effects. You may report side effects to FDA at 1-800-FDA-1088. Where should I keep my medication? This medication is only given in a hospital or  clinic and will not be stored at home. NOTE: This sheet is a summary. It may not cover all possible information. If you have questions about this medicine, talk to your doctor, pharmacist, or health care provider.  2024 Elsevier/Gold Standard (2021-05-15 00:00:00)

## 2023-01-20 NOTE — Telephone Encounter (Signed)
 Error

## 2023-01-20 NOTE — Progress Notes (Signed)
 1159: pt reports feeling flushed in the face, pt noticeably sweating Gazyva  paused.  Tinnie Dawn NP notified and normal saline started.  1205: pt reports the hot feeling is improving but feels nauseous at this time.  Zofran  8 mg IV given per Tinnie Dawn NP order.  1215: Sweating has resolved and pt reports no longer feeling hot and Nausea is improving.  Continue to monitor at this time per Tinnie Dawn NP.  1224: Pt reports nausea significantly better.  1230: per pt symptoms have resolved and back to baseline.  Per Tinnie Dawn NP okay to restart.  1430: Pt reports that shortly after restarting he has had a slight chill that is lingering, no s/s of distress noted, no visible s/s of reaction noted. Tinnie Dawn NP aware.  Per Tinnie Dawn NP okay to continue with Gazyva  infusion . 1528: pt starting to feel hot, legs are aching and temp 101.9. Gazyva  paused and NS started. Per Dr. Rennie hold Gazyva , give Tylenol  650 mg PO and Singulair  10 mg PO and recheck in 20 minutes.  Shades pulled to block direct sunlight ( pt sitting in sun by window.)  1537: pt no longer sweating and reports he no longer feels hot.  1534: Pt reports symptoms resolved, and back to baseline. Oral temp 102.3. Dr. Rennie made aware.  1606: Per Dr. Rennie monitor 15 minutes and recheck.  1622: temp 101.6. Dr. Rennie aware.  1630: Dr. Rennie at chairside, pt reports felling slightly hot, but not like before. Per Dr. Rennie give 50 mg iV Solumedrol and restart Gazyva .    1645:Report given to [facility] RN and Regulatory Affairs Officer. Pt stable at this time.

## 2023-01-21 ENCOUNTER — Inpatient Hospital Stay: Payer: BC Managed Care – PPO

## 2023-01-21 ENCOUNTER — Telehealth: Payer: Self-pay

## 2023-01-21 ENCOUNTER — Encounter: Payer: Self-pay | Admitting: Internal Medicine

## 2023-01-21 VITALS — BP 112/70 | HR 76 | Temp 99.0°F | Resp 17

## 2023-01-21 DIAGNOSIS — Z5112 Encounter for antineoplastic immunotherapy: Secondary | ICD-10-CM | POA: Diagnosis not present

## 2023-01-21 DIAGNOSIS — C9110 Chronic lymphocytic leukemia of B-cell type not having achieved remission: Secondary | ICD-10-CM | POA: Diagnosis not present

## 2023-01-21 MED ORDER — MONTELUKAST SODIUM 10 MG PO TABS
10.0000 mg | ORAL_TABLET | Freq: Once | ORAL | Status: AC
  Administered 2023-01-21: 10 mg via ORAL
  Filled 2023-01-21: qty 1

## 2023-01-21 MED ORDER — DIPHENHYDRAMINE HCL 50 MG/ML IJ SOLN
50.0000 mg | Freq: Once | INTRAMUSCULAR | Status: AC
  Administered 2023-01-21: 50 mg via INTRAVENOUS
  Filled 2023-01-21: qty 1

## 2023-01-21 MED ORDER — FAMOTIDINE IN NACL 20-0.9 MG/50ML-% IV SOLN
20.0000 mg | Freq: Once | INTRAVENOUS | Status: AC
  Administered 2023-01-21: 20 mg via INTRAVENOUS
  Filled 2023-01-21: qty 50

## 2023-01-21 MED ORDER — SODIUM CHLORIDE 0.9 % IV SOLN
20.0000 mg | Freq: Once | INTRAVENOUS | Status: AC
  Administered 2023-01-21: 20 mg via INTRAVENOUS
  Filled 2023-01-21: qty 20

## 2023-01-21 MED ORDER — SODIUM CHLORIDE 0.9 % IV SOLN
900.0000 mg | Freq: Once | INTRAVENOUS | Status: AC
  Administered 2023-01-21: 900 mg via INTRAVENOUS
  Filled 2023-01-21: qty 36

## 2023-01-21 MED ORDER — ACETAMINOPHEN 325 MG PO TABS
650.0000 mg | ORAL_TABLET | Freq: Once | ORAL | Status: AC
  Administered 2023-01-21: 650 mg via ORAL
  Filled 2023-01-21: qty 2

## 2023-01-21 MED ORDER — SODIUM CHLORIDE 0.9 % IV SOLN
INTRAVENOUS | Status: DC
  Filled 2023-01-21: qty 250

## 2023-01-21 NOTE — Telephone Encounter (Signed)
 Telephone call to patient for follow up after receiving first infusion.   Patient states infusion went great.  States eating good and drinking plenty of fluids.  States did have some nausea but no vomiting. States had second infusion today and it went great.  Denies any nausea.   Encouraged patient to call for any concerns or questions.

## 2023-01-22 LAB — IMMUNOGLOBULINS A/E/G/M, SERUM
IgA: 52 mg/dL — ABNORMAL LOW (ref 90–386)
IgE (Immunoglobulin E), Serum: 4 [IU]/mL — ABNORMAL LOW (ref 6–495)
IgG (Immunoglobin G), Serum: 649 mg/dL (ref 603–1613)
IgM (Immunoglobulin M), Srm: 515 mg/dL — ABNORMAL HIGH (ref 20–172)

## 2023-01-23 MED FILL — Dexamethasone Sodium Phosphate Inj 100 MG/10ML: INTRAMUSCULAR | Qty: 2 | Status: AC

## 2023-01-26 ENCOUNTER — Inpatient Hospital Stay: Payer: BC Managed Care – PPO

## 2023-01-26 ENCOUNTER — Inpatient Hospital Stay (HOSPITAL_BASED_OUTPATIENT_CLINIC_OR_DEPARTMENT_OTHER): Payer: BC Managed Care – PPO | Admitting: Internal Medicine

## 2023-01-26 ENCOUNTER — Other Ambulatory Visit: Payer: Self-pay | Admitting: Pharmacist

## 2023-01-26 ENCOUNTER — Encounter: Payer: Self-pay | Admitting: Internal Medicine

## 2023-01-26 VITALS — BP 110/70 | HR 75 | Temp 97.7°F | Resp 16 | Wt 189.0 lb

## 2023-01-26 VITALS — BP 107/67 | HR 75 | Temp 97.1°F | Resp 16

## 2023-01-26 DIAGNOSIS — C9110 Chronic lymphocytic leukemia of B-cell type not having achieved remission: Secondary | ICD-10-CM

## 2023-01-26 DIAGNOSIS — Z5112 Encounter for antineoplastic immunotherapy: Secondary | ICD-10-CM | POA: Diagnosis not present

## 2023-01-26 LAB — CBC WITH DIFFERENTIAL (CANCER CENTER ONLY)
Abs Immature Granulocytes: 0.31 10*3/uL — ABNORMAL HIGH (ref 0.00–0.07)
Basophils Absolute: 0.1 10*3/uL (ref 0.0–0.1)
Basophils Relative: 0 %
Eosinophils Absolute: 0.2 10*3/uL (ref 0.0–0.5)
Eosinophils Relative: 1 %
HCT: 29.6 % — ABNORMAL LOW (ref 39.0–52.0)
Hemoglobin: 9.2 g/dL — ABNORMAL LOW (ref 13.0–17.0)
Immature Granulocytes: 1 %
Lymphocytes Relative: 82 %
Lymphs Abs: 25.2 10*3/uL — ABNORMAL HIGH (ref 0.7–4.0)
MCH: 29.7 pg (ref 26.0–34.0)
MCHC: 31.1 g/dL (ref 30.0–36.0)
MCV: 95.5 fL (ref 80.0–100.0)
Monocytes Absolute: 2 10*3/uL — ABNORMAL HIGH (ref 0.1–1.0)
Monocytes Relative: 7 %
Neutro Abs: 2.7 10*3/uL (ref 1.7–7.7)
Neutrophils Relative %: 9 %
Platelet Count: 129 10*3/uL — ABNORMAL LOW (ref 150–400)
RBC: 3.1 MIL/uL — ABNORMAL LOW (ref 4.22–5.81)
RDW: 19.4 % — ABNORMAL HIGH (ref 11.5–15.5)
Smear Review: NORMAL
WBC Count: 30.5 10*3/uL — ABNORMAL HIGH (ref 4.0–10.5)
nRBC: 1.8 % — ABNORMAL HIGH (ref 0.0–0.2)

## 2023-01-26 LAB — CMP (CANCER CENTER ONLY)
ALT: 37 U/L (ref 0–44)
AST: 23 U/L (ref 15–41)
Albumin: 3.9 g/dL (ref 3.5–5.0)
Alkaline Phosphatase: 59 U/L (ref 38–126)
Anion gap: 9 (ref 5–15)
BUN: 29 mg/dL — ABNORMAL HIGH (ref 6–20)
CO2: 25 mmol/L (ref 22–32)
Calcium: 9 mg/dL (ref 8.9–10.3)
Chloride: 104 mmol/L (ref 98–111)
Creatinine: 1.03 mg/dL (ref 0.61–1.24)
GFR, Estimated: >60 mL/min (ref >60)
Glucose, Bld: 184 mg/dL — ABNORMAL HIGH (ref 70–99)
Potassium: 4.2 mmol/L (ref 3.5–5.1)
Sodium: 138 mmol/L (ref 135–145)
Total Bilirubin: 0.9 mg/dL (ref 0.0–1.2)
Total Protein: 6.7 g/dL (ref 6.5–8.1)

## 2023-01-26 LAB — PHOSPHORUS: Phosphorus: 3.3 mg/dL (ref 2.5–4.6)

## 2023-01-26 LAB — MAGNESIUM: Magnesium: 2.1 mg/dL (ref 1.7–2.4)

## 2023-01-26 LAB — URIC ACID: Uric Acid, Serum: 3.8 mg/dL (ref 3.7–8.6)

## 2023-01-26 LAB — LACTATE DEHYDROGENASE: LDH: 236 U/L — ABNORMAL HIGH (ref 98–192)

## 2023-01-26 MED ORDER — VENETOCLAX 10 & 50 & 100 MG PO TBPK
ORAL_TABLET | ORAL | 0 refills | Status: DC
  Filled 2023-02-05: qty 42, 28d supply, fill #0

## 2023-01-26 MED ORDER — FAMOTIDINE IN NACL 20-0.9 MG/50ML-% IV SOLN
20.0000 mg | Freq: Once | INTRAVENOUS | Status: AC
  Administered 2023-01-26: 20 mg via INTRAVENOUS
  Filled 2023-01-26: qty 50

## 2023-01-26 MED ORDER — DIPHENHYDRAMINE HCL 50 MG/ML IJ SOLN
50.0000 mg | Freq: Once | INTRAMUSCULAR | Status: AC
  Administered 2023-01-26: 50 mg via INTRAVENOUS
  Filled 2023-01-26: qty 1

## 2023-01-26 MED ORDER — SODIUM CHLORIDE 0.9 % IV SOLN
INTRAVENOUS | Status: DC
  Filled 2023-01-26: qty 250

## 2023-01-26 MED ORDER — ACETAMINOPHEN 325 MG PO TABS
650.0000 mg | ORAL_TABLET | Freq: Once | ORAL | Status: AC
  Administered 2023-01-26: 650 mg via ORAL
  Filled 2023-01-26: qty 2

## 2023-01-26 MED ORDER — SODIUM CHLORIDE 0.9 % IV SOLN
20.0000 mg | Freq: Once | INTRAVENOUS | Status: AC
  Administered 2023-01-26: 20 mg via INTRAVENOUS
  Filled 2023-01-26: qty 20

## 2023-01-26 MED ORDER — MONTELUKAST SODIUM 10 MG PO TABS
10.0000 mg | ORAL_TABLET | Freq: Once | ORAL | Status: AC
  Administered 2023-01-26: 10 mg via ORAL
  Filled 2023-01-26: qty 1

## 2023-01-26 MED ORDER — SODIUM CHLORIDE 0.9 % IV SOLN
1000.0000 mg | Freq: Once | INTRAVENOUS | Status: AC
  Administered 2023-01-26: 1000 mg via INTRAVENOUS
  Filled 2023-01-26: qty 40

## 2023-01-26 NOTE — Progress Notes (Signed)
Doing well. Has taste alteration; lost 5 lbs. Denies nausea. Denies pain. Fatigue somewhat. States he developed a fever during initial treatment while in clinic, none at home.

## 2023-01-26 NOTE — Patient Instructions (Signed)
[facility] - A DEPT OF [facility]  Discharge Instructions: Thank you for choosing [facility] to provide your oncology and hematology care.  If you have a lab appointment with the Cancer Center, please go directly to the Cancer Center and check in at the registration area.  Wear comfortable clothing and clothing appropriate for easy access to any Portacath or PICC line.   We strive to give you quality time with your provider. You may need to reschedule your appointment if you arrive late (15 or more minutes).  Arriving late affects you and other patients whose appointments are after yours.  Also, if you miss three or more appointments without notifying the office, you may be dismissed from the clinic at the provider's discretion.      For prescription refill requests, have your pharmacy contact our office and allow 72 hours for refills to be completed.    Today you received the following chemotherapy and/or immunotherapy agents Gazyva       To help prevent nausea and vomiting after your treatment, we encourage you to take your nausea medication as directed.  BELOW ARE SYMPTOMS THAT SHOULD BE REPORTED IMMEDIATELY: *FEVER GREATER THAN 100.4 F (38 C) OR HIGHER *CHILLS OR SWEATING *NAUSEA AND VOMITING THAT IS NOT CONTROLLED WITH YOUR NAUSEA MEDICATION *UNUSUAL SHORTNESS OF BREATH *UNUSUAL BRUISING OR BLEEDING *URINARY PROBLEMS (pain or burning when urinating, or frequent urination) *BOWEL PROBLEMS (unusual diarrhea, constipation, pain near the anus) TENDERNESS IN MOUTH AND THROAT WITH OR WITHOUT PRESENCE OF ULCERS (sore throat, sores in mouth, or a toothache) UNUSUAL RASH, SWELLING OR PAIN  UNUSUAL VAGINAL DISCHARGE OR ITCHING   Items with * indicate a potential emergency and should be followed up as soon as possible or go to the Emergency Department if any problems should occur.  Please show the CHEMOTHERAPY ALERT CARD or IMMUNOTHERAPY  ALERT CARD at check-in to the Emergency Department and triage nurse.  Should you have questions after your visit or need to cancel or reschedule your appointment, please contact [facility] - A DEPT OF [facility]  [phone] and follow the prompts.  Office hours are 8:00 a.m. to 4:30 p.m. Monday - Friday. Please note that voicemails left after 4:00 p.m. may not be returned until the following business day.  We are closed weekends and major holidays. You have access to a nurse at all times for urgent questions. Please call the main number to the clinic [phone] and follow the prompts.  For any non-urgent questions, you may also contact your provider using MyChart. We now offer e-Visits for anyone 81 and older to request care online for non-urgent symptoms. For details visit [email or web address].   Also download the MyChart app! Go to the app store, search "MyChart", open the app, select [facility], and log in with your MyChart username and password.  Obinutuzumab Injection What is this medication? OBINUTUZUMAB (OH bi nue TOOZ ue mab) treats leukemia and lymphoma. It works by blocking a protein that causes cancer cells to grow and multiply. This helps to slow or stop the spread of cancer cells. It is a monoclonal antibody. This medicine may be used for other purposes; ask your health care provider or pharmacist if you have questions. COMMON BRAND NAME(S): GAZYVA What should I tell my care team before I take this medication? They need to know if you have any of these conditions: Heart disease Infection, especially  a viral infection, such as hepatitis B Lung or breathing disease Take medications that treat or prevent blood clots An unusual or allergic reaction to obinutuzumab, other medications, foods, dyes, or preservatives Pregnant or trying to get pregnant Breastfeeding How should I use this medication? This medication is for infusion into a vein. It  is given by a care team in a hospital or clinic setting. Talk to your care team about the use of this medication in children. Special care may be needed. Overdosage: If you think you have taken too much of this medicine contact a poison control center or emergency room at once. NOTE: This medicine is only for you. Do not share this medicine with others. What if I miss a dose? Keep appointments for follow-up doses as directed. It is important not to miss your dose. Call your care team if you are unable to keep an appointment. What may interact with this medication? Live virus vaccines This list may not describe all possible interactions. Give your health care provider a list of all the medicines, herbs, non-prescription drugs, or dietary supplements you use. Also tell them if you smoke, drink alcohol, or use illegal drugs. Some items may interact with your medicine. What should I watch for while using this medication? Report any side effects that you notice during your treatment right away, such as changes in your breathing, fever, chills, dizziness or lightheadedness. These effects are more common with the first dose. Visit your care team for checks on your progress. You will need to have regular blood work. Report any other side effects. The side effects of this medication can continue after you finish your treatment. Continue your course of treatment even though you feel ill unless your care team tells you to stop. Call your care team for advice if you get a fever, chills or sore throat, or other symptoms of a cold or flu. Do not treat yourself. This medication decreases your body's ability to fight infections. Try to avoid being around people who are sick. This medication may increase your risk to bruise or bleed. Call your care team if you notice any unusual bleeding. Do not become pregnant while taking this medication or for 6 months after stopping it. Inform your care team if you wish to become  pregnant or think you might be pregnant. There is a potential for serious side effects to an unborn child. Talk to your care team or pharmacist for more information. Do not breast-feed an infant while taking this medication or for 6 months after stopping it. What side effects may I notice from receiving this medication? Side effects that you should report to your care team as soon as possible: Allergic reactions--skin rash, itching, hives, swelling of the face, lips, tongue, or throat Bleeding--bloody or black, tar-like stools, vomiting blood or brown material that looks like coffee grounds, red or dark brown urine, small red or purple spots on skin, unusual bruising or bleeding Blood clot--pain, swelling, or warmth in the leg, shortness of breath, chest pain Dizziness, loss of balance or coordination, confusion or trouble speaking Infection--fever, chills, cough, sore throat, wounds that don't heal, pain or trouble when passing urine, general feeling of discomfort or being unwell Infusion reactions--chest pain, shortness of breath or trouble breathing, feeling faint or lightheaded Liver injury--right upper belly pain, loss of appetite, nausea, light-colored stool, dark yellow or brown urine, yellowing skin or eyes, unusual weakness or fatigue Tumor lysis syndrome (TLS)--nausea, vomiting, diarrhea, decrease in the amount of urine,  dark urine, unusual weakness or fatigue, confusion, muscle pain or cramps, fast or irregular heartbeat, joint pain Side effects that usually do not require medical attention (report to your care team if they continue or are bothersome): Bone, joint, or muscle pain Constipation Diarrhea Fatigue Runny or stuffy nose Sore throat This list may not describe all possible side effects. Call your doctor for medical advice about side effects. You may report side effects to FDA at 1-800-FDA-1088. Where should I keep my medication? This medication is only given in a hospital or  clinic and will not be stored at home. NOTE: This sheet is a summary. It may not cover all possible information. If you have questions about this medicine, talk to your doctor, pharmacist, or health care provider.  2024 Elsevier/Gold Standard (2021-05-15 00:00:00)

## 2023-01-26 NOTE — Assessment & Plan Note (Addendum)
#  CLL-CD38 positive stage III [retroperitoneal lymph nodes]-Asymptomatic. IGVH- UNMUTATED; JULY  FISH panel- 12 trisomy-   DEC 18th, 2024-  up to 17 mm- Stable lymphadenopathy involving the chest, abdomen and pelvis. enlargement of the spleen. Currently fixed duration therapy- Gazyva-[awaiting to start Venatoclax with plan to start cycle #2]  # proceed with Gazyva cycle #1 day-8. Labs-CBC/chemistries were reviewed with the patient.  # Gazyva infusion- G-1-2- improved post benadryl/ solumedrol will monitor closely with subsequent cycles.   #History of urinary retention /prostatomegaly /prostate cancer  [Dr.Brandon]incidental-  2.1 x 0.9 cm stone or calcified mucosal lesion posterior left bladder wall in the region of the left UVJ.  Dr.Brandon. stable.   # CKD stage II-III-  stable.   # MS clinically stable.  # Secondary cancers: colo-[march 2024; [place]]; recommend surveillaince with dermatology.   # Vaccination: discussed re: Flu shots; pneumonia vaccination [s/p Shigles vacciation- in past]  # Hx of Gout Elevated uric acid 9.5-second of CLL-on allopurinol recommend increasing dose to 300 BID  # ID prophylaxis: recommend Bactrim DS; and Acyclovir.   # DISPOSITION: # gazyva today- wait on CMP # as planned- in  1 week MD; labs- cbc/cmp;LDH; mag; phos;uric acid Gazyva # follow up on 2/12- MD; labs- cbc/cmp; LDH;  mag; phos;uric acid   Gazyva-  Dr.B

## 2023-01-26 NOTE — Progress Notes (Signed)
[facility] CONSULT NOTE  Patient Care Team: [name], MD as PCP - General (Family Medicine) Earna Coder, MD as Consulting Physician (Oncology)  CHIEF COMPLAINTS/PURPOSE OF CONSULTATION: CLL  Oncology History Overview Note  Chronic lymphocytic leukemia (CLL), positive for CD20, CD22, CD19 and  CD38,  see comment.   ANNOTATION COMMENT IMP Comment VC   Comment: (NOTE)  Expression of CD38 on >30% of the clonal B-cells is reported to be an  unfavorable prognostic factor in CLL.     # CLL stage I- [lymphocytosis/retroperitoneal lymphadenopathy up to 2.5 cm-incidental CT protocol- 2023];   APRIL 2024- consistent with trisomy 12. Results for  CCND1/IGH, ATM, 13q and TP53 were normal.   # JAN 14th, 2024- Gazyva+ plan to add Venatoclax with cycle 2 or 3.   #GEN 2023-bilateral severe hydronephrosis-bladder outlet obstruction/status post catheter [Dr.Brandon]  #History of prostate cancer under surveillance [[facility]]   CLL (chronic lymphocytic leukemia) (HCC)  02/11/2021 Initial Diagnosis   CLL (chronic lymphocytic leukemia) (HCC)   01/20/2023 -  Chemotherapy   Patient is on Treatment Plan : LYMPHOMA CLL/SLL Venetoclax + Obinutuzumab q28d      HISTORY OF PRESENTING ILLNESS: Alone.  Ambulating independently.  Ralph [name] 58 y.o.  male history of multiple sclerosis-not on active therapy; prostate cancer on surveillance; CLL currently on [place] to start Venatoclax] is here for a follow-up...  Patient tolerated Gazyva with mild- moderate infusion reaction.   Appetite is good. However notes to have weight loss. Denies any lumps or bumps.  Denies any fevers or chills.  No recent infections.  Review of Systems  Constitutional:  Positive for weight loss. Negative for chills, diaphoresis, fever and malaise/fatigue.  HENT:  Negative for nosebleeds and sore throat.   Eyes:  Negative for double vision.  Respiratory:  Negative for cough, hemoptysis,  sputum production, shortness of breath and wheezing.   Cardiovascular:  Negative for chest pain, palpitations, orthopnea and leg swelling.  Gastrointestinal:  Negative for abdominal pain, blood in stool, constipation, diarrhea, heartburn, melena, nausea and vomiting.  Genitourinary:  Negative for dysuria, frequency and urgency.  Musculoskeletal:  Negative for back pain and joint pain.  Skin: Negative.  Negative for itching and rash.  Neurological:  Negative for dizziness, tingling, focal weakness, weakness and headaches.  Endo/Heme/Allergies:  Does not bruise/bleed easily.  Psychiatric/Behavioral:  Negative for depression. The patient is not nervous/anxious and does not have insomnia.      MEDICAL HISTORY:  Past Medical History:  Diagnosis Date   Acute renal failure (HCC)    Anxiety    Bladder outlet obstruction    CLL (chronic lymphocytic leukemia) (HCC)    Elevated PSA    Foreign body of bladder    MS (multiple sclerosis) (HCC)    Pre-diabetes    Prostate cancer (HCC) 2013   UTI (urinary tract infection)     SURGICAL HISTORY: Past Surgical History:  Procedure Laterality Date   CYSTOSCOPY N/A 12/16/2021   Procedure: CYSTOSCOPY WITH REMOVAL OF FOREIGN BODY;  Surgeon: Vanna Scotland, MD;  Location: [facility] ORS;  Service: Urology;  Laterality: N/A;   HOLEP-LASER ENUCLEATION OF THE PROSTATE WITH MORCELLATION N/A 03/11/2021   Procedure: HOLEP-LASER ENUCLEATION OF THE PROSTATE WITH MORCELLATION;  Surgeon: Vanna Scotland, MD;  Location: [facility] ORS;  Service: Urology;  Laterality: N/A;   KNEE SURGERY  2002   meniscus   TRANSURETHRAL RESECTION OF PROSTATE N/A 12/16/2021   Procedure: TRANSURETHRAL RESECTION OF THE PROSTATE (TURP);  Surgeon: Vanna Scotland, MD;  Location: [facility] ORS;  Service: Urology;  Laterality: N/A;    SOCIAL HISTORY: Social History   Socioeconomic History   Marital status: Single    Spouse name: Not on file   Number of children: 0   Years of education: College    Highest education level: Not on file  Occupational History   Occupation: Research officer, 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  Denies any alcohol; lives in [place].    Social Drivers of Health   Financial Resource Strain: Low Risk  (08/27/2022)   Received [facility]   Overall Financial Resource Strain (CARDIA)    Difficulty of Paying Living Expenses: Not hard at all  Food Insecurity: No Food Insecurity (08/27/2022)   Received from [facility]   Hunger Vital Sign    Worried About Running Out of Food in the Last Year: Never true    Ran Out of Food in the Last Year: Never true  Transportation Needs: No Transportation Needs (08/27/2022)   Received from [facility] - Transportation    In the past 12 months, has lack of transportation kept you from medical appointments or from getting medications?: No    Lack of Transportation (Non-Medical): No  Physical Activity: Not on file  Stress: Not on file  Social Connections: Not on file  Intimate Partner Violence: Not on file    FAMILY HISTORY: Family History  Problem Relation Age of Onset   Cancer Sister        breast CA     ALLERGIES:  is allergic to gazyva [obinutuzumab].  MEDICATIONS:  Current Outpatient Medications  Medication Sig Dispense Refill   acyclovir (ZOVIRAX) 400 MG tablet Take 1 tablet (400 mg total) by mouth 2 (two) times daily. 60 tablet 4   allopurinol (ZYLOPRIM) 300 MG tablet Take 1 tablet (300 mg total) by mouth 2 (two) times daily. 60  tablet 4   Multiple Vitamin (MULTIVITAMIN) tablet Take 1 tablet by mouth daily.     rosuvastatin (CRESTOR) 5 MG tablet Take 1 tablet by mouth at bedtime.     sulfamethoxazole-trimethoprim (BACTRIM DS) 800-160 MG tablet One pill on Monday;wed;Friday 30 tablet 1   ondansetron (ZOFRAN) 8 MG tablet One pill every 8 hours as needed for nausea/vomitting. (Patient not taking: Reported on 01/26/2023) 40 tablet 1   prochlorperazine (COMPAZINE) 10 MG tablet Take 1 tablet (10 mg total) by mouth every 6 (six) hours as needed for nausea or vomiting. (Patient not taking: Reported on 01/26/2023) 40 tablet 1   No current facility-administered medications for this visit.      [name]  PHYSICAL EXAMINATION:  Vitals:   01/26/23 0853  BP: 110/70  Pulse: 75  Resp: 16  Temp: 97.7 F (36.5 C)  SpO2: 100%     Filed Weights   01/26/23 0853  Weight: 189 lb (85.7 kg)   Shotty bilateral neck adenopathy.  1 to 2 cm lymph node bilateral axilla left  more than right.  Mild lymphadenopathy in the right inguinal region. Mild splenomegaly- 4 fingers breadth below the costal margin. ; hepatomegaly.   Physical Exam Vitals and nursing note reviewed.  HENT:     Head: Normocephalic and atraumatic.     Mouth/Throat:     Pharynx: Oropharynx is clear.  Eyes:     Extraocular Movements: Extraocular movements intact.     Pupils: Pupils are equal, round, and reactive to light.  Cardiovascular:     Rate and Rhythm: Normal rate and regular rhythm.  Pulmonary:     Comments: Decreased breath sounds bilaterally.  Abdominal:     Palpations: Abdomen is soft.  Musculoskeletal:        General: Normal range of motion.     Cervical back: Normal range of motion.  Skin:    General: Skin is warm.  Neurological:     General: No focal deficit present.     Mental Status: He is alert and oriented to person, place, and time.  Psychiatric:        Behavior: Behavior normal.        Judgment: Judgment normal.     LABORATORY DATA:   I have reviewed the data as listed Lab Results  Component Value Date   WBC 30.5 (H) 01/26/2023   HGB 9.2 (L) 01/26/2023   HCT 29.6 (L) 01/26/2023   MCV 95.5 01/26/2023   PLT 129 (L) 01/26/2023   Recent Labs    12/17/22 1438 01/19/23 1446 01/26/23 0842  NA 142 140 138  K 4.0 4.0 4.2  CL 109 104 104  CO2 25 26 25   GLUCOSE 113* 151* 184*  BUN 24* 23* 29*  CREATININE 1.23 1.19 1.03  CALCIUM 9.4 9.1 9.0  GFRNONAA >60 >60 >60  PROT 6.7 6.7 6.7  ALBUMIN 4.2 4.1 3.9  AST 24 27 23   ALT 20 17 37  ALKPHOS 107 100 59  BILITOT 1.2* 1.1 0.9    RADIOGRAPHIC STUDIES: I have personally reviewed the radiological images as listed and agreed with the findings in the report. No results found.   CLL (chronic lymphocytic leukemia) (HCC) #CLL-CD38 positive stage III [retroperitoneal lymph nodes]-Asymptomatic. IGVH- UNMUTATED; JULY  FISH panel- 12 trisomy-   DEC 18th, 2024-  up to 17 mm- Stable lymphadenopathy involving the chest, abdomen and pelvis. enlargement of the spleen. Currently fixed duration therapy- Gazyva-[awaiting to start Venatoclax with plan to start cycle #2]  # proceed with Gazyva cycle #1 day-8. Labs-CBC/chemistries were reviewed with the patient.  # Gazyva infusion- G-1-2- improved post benadryl/ solumedrol will monitor closely with subsequent cycles.   #History of urinary retention /prostatomegaly /prostate cancer  [Dr.Brandon]incidental-  2.1 x 0.9 cm stone or calcified mucosal lesion posterior left bladder wall in the region of the left UVJ.  Dr.Brandon. stable.   # CKD stage II-III-  stable.   # MS clinically stable.  # Secondary cancers: colo-[march 2024; [place]]; recommend surveillaince with dermatology.   # Vaccination: discussed re: Flu shots; pneumonia vaccination [s/p Shigles vacciation- in past]  # Hx of Gout Elevated uric acid 9.5-second of CLL-on allopurinol recommend increasing dose to 300 BID  # ID prophylaxis: recommend Bactrim DS; and Acyclovir.    # DISPOSITION: # gazyva today- wait on CMP # as planned- in  1 week MD; labs- cbc/cmp;LDH; mag; phos;uric acid Gazyva # follow up on 2/12- MD; labs- cbc/cmp; LDH;  mag; phos;uric acid   Gazyva-  Dr.B    All questions were answered. The patient knows  to call the clinic with any problems, questions or concerns.       Earna Coder, MD 01/26/2023 9:22 AM

## 2023-01-30 MED FILL — Dexamethasone Sodium Phosphate Inj 100 MG/10ML: INTRAMUSCULAR | Qty: 2 | Status: AC

## 2023-02-02 ENCOUNTER — Inpatient Hospital Stay (HOSPITAL_BASED_OUTPATIENT_CLINIC_OR_DEPARTMENT_OTHER): Payer: BC Managed Care – PPO | Admitting: Internal Medicine

## 2023-02-02 ENCOUNTER — Encounter: Payer: Self-pay | Admitting: Internal Medicine

## 2023-02-02 ENCOUNTER — Inpatient Hospital Stay: Payer: BC Managed Care – PPO

## 2023-02-02 VITALS — BP 110/63 | HR 80 | Temp 99.3°F | Resp 18

## 2023-02-02 VITALS — BP 104/69 | HR 95 | Temp 99.5°F | Resp 18 | Wt 184.0 lb

## 2023-02-02 DIAGNOSIS — C9110 Chronic lymphocytic leukemia of B-cell type not having achieved remission: Secondary | ICD-10-CM | POA: Diagnosis not present

## 2023-02-02 DIAGNOSIS — Z5112 Encounter for antineoplastic immunotherapy: Secondary | ICD-10-CM | POA: Diagnosis not present

## 2023-02-02 LAB — CMP (CANCER CENTER ONLY)
ALT: 20 U/L (ref 0–44)
AST: 18 U/L (ref 15–41)
Albumin: 4.2 g/dL (ref 3.5–5.0)
Alkaline Phosphatase: 53 U/L (ref 38–126)
Anion gap: 9 (ref 5–15)
BUN: 19 mg/dL (ref 6–20)
CO2: 26 mmol/L (ref 22–32)
Calcium: 9.2 mg/dL (ref 8.9–10.3)
Chloride: 105 mmol/L (ref 98–111)
Creatinine: 0.99 mg/dL (ref 0.61–1.24)
GFR, Estimated: >60 mL/min (ref >60)
Glucose, Bld: 166 mg/dL — ABNORMAL HIGH (ref 70–99)
Potassium: 4.1 mmol/L (ref 3.5–5.1)
Sodium: 140 mmol/L (ref 135–145)
Total Bilirubin: 1.1 mg/dL (ref 0.0–1.2)
Total Protein: 6.8 g/dL (ref 6.5–8.1)

## 2023-02-02 LAB — CBC WITH DIFFERENTIAL (CANCER CENTER ONLY)
Abs Immature Granulocytes: 0.07 10*3/uL (ref 0.00–0.07)
Basophils Absolute: 0 10*3/uL (ref 0.0–0.1)
Basophils Relative: 0 %
Eosinophils Absolute: 0.1 10*3/uL (ref 0.0–0.5)
Eosinophils Relative: 0 %
HCT: 33.8 % — ABNORMAL LOW (ref 39.0–52.0)
Hemoglobin: 10.2 g/dL — ABNORMAL LOW (ref 13.0–17.0)
Immature Granulocytes: 0 %
Lymphocytes Relative: 70 %
Lymphs Abs: 12 10*3/uL — ABNORMAL HIGH (ref 0.7–4.0)
MCH: 29.1 pg (ref 26.0–34.0)
MCHC: 30.2 g/dL (ref 30.0–36.0)
MCV: 96.6 fL (ref 80.0–100.0)
Monocytes Absolute: 0.9 10*3/uL (ref 0.1–1.0)
Monocytes Relative: 5 %
Neutro Abs: 4.3 10*3/uL (ref 1.7–7.7)
Neutrophils Relative %: 25 %
Platelet Count: 171 10*3/uL (ref 150–400)
RBC: 3.5 MIL/uL — ABNORMAL LOW (ref 4.22–5.81)
RDW: 19.6 % — ABNORMAL HIGH (ref 11.5–15.5)
Smear Review: ADEQUATE
WBC Count: 17.3 10*3/uL — ABNORMAL HIGH (ref 4.0–10.5)
nRBC: 0.8 % — ABNORMAL HIGH (ref 0.0–0.2)

## 2023-02-02 LAB — MAGNESIUM: Magnesium: 2.1 mg/dL (ref 1.7–2.4)

## 2023-02-02 LAB — PHOSPHORUS: Phosphorus: 4 mg/dL (ref 2.5–4.6)

## 2023-02-02 LAB — LACTATE DEHYDROGENASE: LDH: 181 U/L (ref 98–192)

## 2023-02-02 LAB — URIC ACID: Uric Acid, Serum: 4.1 mg/dL (ref 3.7–8.6)

## 2023-02-02 MED ORDER — FAMOTIDINE IN NACL 20-0.9 MG/50ML-% IV SOLN
20.0000 mg | Freq: Once | INTRAVENOUS | Status: AC
  Administered 2023-02-02: 20 mg via INTRAVENOUS
  Filled 2023-02-02: qty 50

## 2023-02-02 MED ORDER — ACETAMINOPHEN 325 MG PO TABS
650.0000 mg | ORAL_TABLET | Freq: Once | ORAL | Status: AC
  Administered 2023-02-02: 650 mg via ORAL
  Filled 2023-02-02: qty 2

## 2023-02-02 MED ORDER — SODIUM CHLORIDE 0.9 % IV SOLN
1000.0000 mg | Freq: Once | INTRAVENOUS | Status: AC
  Administered 2023-02-02: 1000 mg via INTRAVENOUS
  Filled 2023-02-02: qty 40

## 2023-02-02 MED ORDER — SODIUM CHLORIDE 0.9 % IV SOLN
20.0000 mg | Freq: Once | INTRAVENOUS | Status: AC
  Administered 2023-02-02: 20 mg via INTRAVENOUS
  Filled 2023-02-02: qty 20

## 2023-02-02 MED ORDER — MONTELUKAST SODIUM 10 MG PO TABS
10.0000 mg | ORAL_TABLET | Freq: Once | ORAL | Status: AC
  Administered 2023-02-02: 10 mg via ORAL
  Filled 2023-02-02: qty 1

## 2023-02-02 MED ORDER — SODIUM CHLORIDE 0.9 % IV SOLN
INTRAVENOUS | Status: DC
  Filled 2023-02-02: qty 250

## 2023-02-02 MED ORDER — DIPHENHYDRAMINE HCL 50 MG/ML IJ SOLN
50.0000 mg | Freq: Once | INTRAMUSCULAR | Status: AC
  Administered 2023-02-02: 50 mg via INTRAVENOUS
  Filled 2023-02-02: qty 1

## 2023-02-02 NOTE — Patient Instructions (Signed)
[facility] - A DEPT OF [facility]  Discharge Instructions: Thank you for choosing [facility] to provide your oncology and hematology care.  If you have a lab appointment with the Cancer Center, please go directly to the Cancer Center and check in at the registration area.  Wear comfortable clothing and clothing appropriate for easy access to any Portacath or PICC line.   We strive to give you quality time with your provider. You may need to reschedule your appointment if you arrive late (15 or more minutes).  Arriving late affects you and other patients whose appointments are after yours.  Also, if you miss three or more appointments without notifying the office, you may be dismissed from the clinic at the provider's discretion.      For prescription refill requests, have your pharmacy contact our office and allow 72 hours for refills to be completed.    Today you received the following chemotherapy and/or immunotherapy agents Gazyva      To help prevent nausea and vomiting after your treatment, we encourage you to take your nausea medication as directed.  BELOW ARE SYMPTOMS THAT SHOULD BE REPORTED IMMEDIATELY: *FEVER GREATER THAN 100.4 F (38 C) OR HIGHER *CHILLS OR SWEATING *NAUSEA AND VOMITING THAT IS NOT CONTROLLED WITH YOUR NAUSEA MEDICATION *UNUSUAL SHORTNESS OF BREATH *UNUSUAL BRUISING OR BLEEDING *URINARY PROBLEMS (pain or burning when urinating, or frequent urination) *BOWEL PROBLEMS (unusual diarrhea, constipation, pain near the anus) TENDERNESS IN MOUTH AND THROAT WITH OR WITHOUT PRESENCE OF ULCERS (sore throat, sores in mouth, or a toothache) UNUSUAL RASH, SWELLING OR PAIN  UNUSUAL VAGINAL DISCHARGE OR ITCHING   Items with * indicate a potential emergency and should be followed up as soon as possible or go to the Emergency Department if any problems should occur.  Please show the CHEMOTHERAPY ALERT CARD or IMMUNOTHERAPY ALERT  CARD at check-in to the Emergency Department and triage nurse.  Should you have questions after your visit or need to cancel or reschedule your appointment, please contact [facility] - A DEPT OF [facility]  [phone] and follow the prompts.  Office hours are 8:00 a.m. to 4:30 p.m. Monday - Friday. Please note that voicemails left after 4:00 p.m. may not be returned until the following business day.  We are closed weekends and major holidays. You have access to a nurse at all times for urgent questions. Please call the main number to the clinic [phone] and follow the prompts.  For any non-urgent questions, you may also contact your provider using MyChart. We now offer e-Visits for anyone 32 and older to request care online for non-urgent symptoms. For details visit [email or web address].   Also download the MyChart app! Go to the app store, search "MyChart", open the app, select , and log in with your MyChart username and password.

## 2023-02-02 NOTE — Assessment & Plan Note (Addendum)
#  CLL-CD38 positive stage III [retroperitoneal lymph nodes]-Asymptomatic. IGVH- UNMUTATED; JULY  FISH panel- 12 trisomy-   DEC 18th, 2024-  up to 17 mm- Stable lymphadenopathy involving the chest, abdomen and pelvis. enlargement of the spleen. Currently fixed duration therapy- Gazyva-[awaiting to start Venatoclax with plan to start cycle #2]  # proceed with Gazyva cycle #1 day-15 . Labs-CBC/chemistries were reviewed with the patient. -plan to start venetoclax at next visit-in about 2 week with cycle #2. Will monitor closely for TLS. Discussed with [facility], Pharmacy.   # Gazyva infusion- G-1-2- improved post benadryl/ solumedrol will monitor closely with subsequent cycles- stable.    #History of urinary retention /prostatomegaly /prostate cancer  [Dr.Brandon]incidental-  2.1 x 0.9 cm stone or calcified mucosal lesion posterior left bladder wall in the region of the left UVJ.  Dr.Brandon. stable.   # CKD stage II-III-  stable.   # MS clinically stable.   # Secondary cancers: colo-[march 2024; [place]]; recommend surveillaince with dermatology- stable. .   # Vaccination: discussed re: Flu shots; pneumonia vaccination [s/p Shigles vacciation- in past]- stable.   # Hx of Gout Elevated uric acid 9.5-second of CLL-on allopurinol increasing dose to 300 BID  # ID prophylaxis: continue Bactrim DS; and Acyclovir.   # DISPOSITION: # gazyva today-  # follow up on 2/12- MD; labs- cbc/cmp; LDH;  mag; phos;uric acid   Gazyva # labs- on 2/13- bmp; LDH;  mag; phos;uric acid  -  Dr.B

## 2023-02-02 NOTE — Progress Notes (Signed)
Patient is doing well, he thinks that he might have a sinus infection.

## 2023-02-02 NOTE — Progress Notes (Signed)
[facility] CONSULT NOTE  Patient Care Team: [name], MD as PCP - General (Family Medicine) Earna Coder, MD as Consulting Physician (Oncology)  CHIEF COMPLAINTS/PURPOSE OF CONSULTATION: CLL  Oncology History Overview Note  Chronic lymphocytic leukemia (CLL), positive for CD20, CD22, CD19 and  CD38,  see comment.   ANNOTATION COMMENT IMP Comment VC   Comment: (NOTE)  Expression of CD38 on >30% of the clonal B-cells is reported to be an  unfavorable prognostic factor in CLL.     # CLL stage I- [lymphocytosis/retroperitoneal lymphadenopathy up to 2.5 cm-incidental CT protocol- 2023];   APRIL 2024- consistent with trisomy 12. Results for  CCND1/IGH, ATM, 13q and TP53 were normal.   # JAN 14th, 2024- Gazyva+ plan to add Venatoclax with cycle 2 or 3.   #GEN 2023-bilateral severe hydronephrosis-bladder outlet obstruction/status post catheter [Dr.Brandon]  #History of prostate cancer under surveillance [[facility]]   CLL (chronic lymphocytic leukemia) (HCC)  02/11/2021 Initial Diagnosis   CLL (chronic lymphocytic leukemia) (HCC)   01/20/2023 -  Chemotherapy   Patient is on Treatment Plan : LYMPHOMA CLL/SLL Venetoclax + Obinutuzumab q28d      HISTORY OF PRESENTING ILLNESS: Alone.  Ambulating independently.  Ralph [name] 58 y.o.  male history of multiple sclerosis-not on active therapy; prostate cancer on surveillance; CLL currently on [place] to start Venatoclax] is here for a follow-up.  Patient is doing well. Patient notes to have irritation in the throat. No discharge. No fevers or cough.   Appetite is good. However notes to have weight loss. Denies any lumps or bumps.  Denies any fevers or chills.  No recent infections.  Review of Systems  Constitutional:  Positive for weight loss. Negative for chills, diaphoresis, fever and malaise/fatigue.  HENT:  Negative for nosebleeds and sore throat.   Eyes:  Negative for double vision.   Respiratory:  Negative for cough, hemoptysis, sputum production, shortness of breath and wheezing.   Cardiovascular:  Negative for chest pain, palpitations, orthopnea and leg swelling.  Gastrointestinal:  Negative for abdominal pain, blood in stool, constipation, diarrhea, heartburn, melena, nausea and vomiting.  Genitourinary:  Negative for dysuria, frequency and urgency.  Musculoskeletal:  Negative for back pain and joint pain.  Skin: Negative.  Negative for itching and rash.  Neurological:  Negative for dizziness, tingling, focal weakness, weakness and headaches.  Endo/Heme/Allergies:  Does not bruise/bleed easily.  Psychiatric/Behavioral:  Negative for depression. The patient is not nervous/anxious and does not have insomnia.      MEDICAL HISTORY:  Past Medical History:  Diagnosis Date   Acute renal failure (HCC)    Anxiety    Bladder outlet obstruction    CLL (chronic lymphocytic leukemia) (HCC)    Elevated PSA    Foreign body of bladder    MS (multiple sclerosis) (HCC)    Pre-diabetes    Prostate cancer (HCC) 2013   UTI (urinary tract infection)     SURGICAL HISTORY: Past Surgical History:  Procedure Laterality Date   CYSTOSCOPY N/A 12/16/2021   Procedure: CYSTOSCOPY WITH REMOVAL OF FOREIGN BODY;  Surgeon: Vanna Scotland, MD;  Location: [facility] ORS;  Service: Urology;  Laterality: N/A;   HOLEP-LASER ENUCLEATION OF THE PROSTATE WITH MORCELLATION N/A 03/11/2021   Procedure: HOLEP-LASER ENUCLEATION OF THE PROSTATE WITH MORCELLATION;  Surgeon: Vanna Scotland, MD;  Location: [facility] ORS;  Service: Urology;  Laterality: N/A;   KNEE SURGERY  2002   meniscus   TRANSURETHRAL RESECTION OF PROSTATE N/A 12/16/2021   Procedure: TRANSURETHRAL  RESECTION OF THE PROSTATE (TURP);  Surgeon: Vanna Scotland, MD;  Location: [facility] ORS;  Service: Urology;  Laterality: N/A;    SOCIAL HISTORY: Social History   Socioeconomic History   Marital status: Single    Spouse name: Not on file   Number of  children: 0   Years of education: College   Highest education level: Not on file  Occupational History   Occupation: Research officer, 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  Denies any alcohol; lives in [place].    Social Drivers of Health   Financial Resource Strain: Low Risk  (08/27/2022)   Received from [facility]   Overall Financial Resource Strain (CARDIA)    Difficulty of Paying Living Expenses: Not hard at all  Food Insecurity: No Food Insecurity (08/27/2022)   Received from [facility]   Hunger Vital Sign    Worried About Running Out of Food in the Last Year: Never true    Ran Out of Food in the Last Year: Never true  Transportation Needs: No Transportation Needs (08/27/2022)   [facility] from [facility]    In the past 12 months, has lack of transportation kept you from medical appointments or from getting medications?: No    Lack of Transportation (Non-Medical): No  Physical Activity: Not on file  Stress: Not on file  Social Connections: Not on file  Intimate Partner Violence: Not on file    FAMILY HISTORY: Family History  Problem Relation Age of Onset   Cancer Sister        breast CA     ALLERGIES:  is allergic to gazyva [obinutuzumab].  MEDICATIONS:  Current Outpatient Medications  Medication Sig Dispense Refill   acyclovir (ZOVIRAX) 400 MG tablet Take 1 tablet (400 mg total) by mouth 2 (two) times daily. 60 tablet 4   allopurinol (ZYLOPRIM) 300 MG tablet Take 1 tablet (300 mg  total) by mouth 2 (two) times daily. 60 tablet 4   Multiple Vitamin (MULTIVITAMIN) tablet Take 1 tablet by mouth daily.     ondansetron (ZOFRAN) 8 MG tablet One pill every 8 hours as needed for nausea/vomitting. (Patient not taking: Reported on 01/26/2023) 40 tablet 1   prochlorperazine (COMPAZINE) 10 MG tablet Take 1 tablet (10 mg total) by mouth every 6 (six) hours as needed for nausea or vomiting. (Patient not taking: Reported on 01/26/2023) 40 tablet 1   rosuvastatin (CRESTOR) 5 MG tablet Take 1 tablet by mouth at bedtime.     sulfamethoxazole-trimethoprim (BACTRIM DS) 800-160 MG tablet One pill on Monday;wed;Friday 30 tablet 1   venetoclax (VENCLEXTA) 10 & 50 & 100 MG Starter Pack Take by mouth daily. Take 20 mg for 7 days, then 50 mg daily x 7d, then 100 mg daily x 7d, then 200 mg daily x 7d. Take with food & water. 42 tablet 0   No current facility-administered medications for this visit.   Facility-Administered Medications Ordered in  Other Visits  Medication Dose Route Frequency Provider Last Rate Last Admin   0.9 %  sodium chloride infusion   Intravenous Continuous Earna Coder, MD 10 mL/hr at 02/02/23 0957 New Bag at 02/02/23 0957   acetaminophen (TYLENOL) tablet 650 mg  650 mg Oral Once Earna Coder, MD       dexamethasone (DECADRON) 20 mg in sodium chloride 0.9 % 50 mL IVPB  20 mg Intravenous Once Earna Coder, MD 208 mL/hr at 02/02/23 0958 20 mg at 02/02/23 0958   diphenhydrAMINE (BENADRYL) injection 50 mg  50 mg Intravenous Once Earna Coder, MD       famotidine (PEPCID) IVPB 20 mg premix  20 mg Intravenous Once Earna Coder, MD       montelukast (SINGULAIR) tablet 10 mg  10 mg Oral QHS Louretta Shorten R, MD       obinutuzumab (GAZYVA) 1,000 mg in sodium chloride 0.9 % 250 mL (3.4483 mg/mL) chemo infusion  1,000 mg Intravenous Once Earna Coder, MD          .  PHYSICAL EXAMINATION:  Vitals:   02/02/23 0857 02/02/23 0947   BP: 104/69   Pulse: (!) 105 95  Resp: 18   Temp: 99.5 F (37.5 C)   SpO2: 98%      Filed Weights   02/02/23 0857  Weight: 184 lb (83.5 kg)   Shotty bilateral neck adenopathy.  1 to 2 cm lymph node bilateral axilla left more than right.  Mild lymphadenopathy in the right inguinal region- improvement.   Physical Exam Vitals and nursing note reviewed.  HENT:     Head: Normocephalic and atraumatic.     Mouth/Throat:     Pharynx: Oropharynx is clear.  Eyes:     Extraocular Movements: Extraocular movements intact.     Pupils: Pupils are equal, round, and reactive to light.  Cardiovascular:     Rate and Rhythm: Normal rate and regular rhythm.  Pulmonary:     Comments: Decreased breath sounds bilaterally.  Abdominal:     Palpations: Abdomen is soft.  Musculoskeletal:        General: Normal range of motion.     Cervical back: Normal range of motion.  Skin:    General: Skin is warm.  Neurological:     General: No focal deficit present.     Mental Status: He is alert and oriented to person, place, and time.  Psychiatric:        Behavior: Behavior normal.        Judgment: Judgment normal.     LABORATORY DATA:  I have reviewed the data as listed Lab Results  Component Value Date   WBC 17.3 (H) 02/02/2023   HGB 10.2 (L) 02/02/2023   HCT 33.8 (L) 02/02/2023   MCV 96.6 02/02/2023   PLT 171 02/02/2023   Recent Labs    01/19/23 1446 01/26/23 0842 02/02/23 0847  NA 140 138 140  K 4.0 4.2 4.1  CL 104 104 105  CO2 26 25 26   GLUCOSE 151* 184* 166*  BUN 23* 29* 19  CREATININE 1.19 1.03 0.99  CALCIUM 9.1 9.0 9.2  GFRNONAA >60 >60 >60  PROT 6.7 6.7 6.8  ALBUMIN 4.1 3.9 4.2  AST 27 23 18   ALT 17 37 20  ALKPHOS 100 59 53  BILITOT 1.1 0.9 1.1    RADIOGRAPHIC STUDIES: I have personally reviewed the radiological images as listed and agreed with the findings in the report. No  results found.   CLL (chronic lymphocytic leukemia) (HCC) #CLL-CD38 positive stage III  [retroperitoneal lymph nodes]-Asymptomatic. IGVH- UNMUTATED; JULY  FISH panel- 12 trisomy-   DEC 18th, 2024-  up to 17 mm- Stable lymphadenopathy involving the chest, abdomen and pelvis. enlargement of the spleen. Currently fixed duration therapy- Gazyva-[awaiting to start Venatoclax with plan to start cycle #2]  # proceed with Gazyva cycle #1 day-15 . Labs-CBC/chemistries were reviewed with the patient. -plan to start venetoclax at next visit-in about 2 week with cycle #2. Will monitor closely for TLS. Discussed with [facility], Pharmacy.   # Gazyva infusion- G-1-2- improved post benadryl/ solumedrol will monitor closely with subsequent cycles- stable.    #History of urinary retention /prostatomegaly /prostate cancer  [Dr.Brandon]incidental-  2.1 x 0.9 cm stone or calcified mucosal lesion posterior left bladder wall in the region of the left UVJ.  Dr.Brandon. stable.   # CKD stage II-III-  stable.   # MS clinically stable.   # Secondary cancers: colo-[march 2024; [place]]; recommend surveillaince with dermatology- stable. .   # Vaccination: discussed re: Flu shots; pneumonia vaccination [s/p Shigles vacciation- in past]- stable.   # Hx of Gout Elevated uric acid 9.5-second of CLL-on allopurinol increasing dose to 300 BID  # ID prophylaxis: continue Bactrim DS; and Acyclovir.   # DISPOSITION: # gazyva today-  # follow up on 2/12- MD; labs- cbc/cmp; LDH;  mag; phos;uric acid   Gazyva # labs- on 2/13- bmp; LDH;  mag; phos;uric acid  -  Dr.B    All questions were answered. The patient knows to call the clinic with any problems, questions or concerns.       Earna Coder, MD 02/02/2023 9:59 AM

## 2023-02-02 NOTE — Addendum Note (Signed)
Addended by: Darrold Span A on: 02/02/2023 10:08 AM   Modules accepted: Orders

## 2023-02-05 ENCOUNTER — Other Ambulatory Visit: Payer: Self-pay

## 2023-02-05 ENCOUNTER — Other Ambulatory Visit (HOSPITAL_COMMUNITY): Payer: Self-pay

## 2023-02-05 NOTE — Progress Notes (Signed)
Specialty Pharmacy Initiation Note   Ralph [name] is a 58 y.o. male who will be followed by the specialty pharmacy service for RxSp Oncology    Review of administration, indication, effectiveness, safety, potential side effects, storage/disposable, and missed dose instructions occurred today for patient's specialty medication(s) Venetoclax [facility])     Patient/Caregiver did not have any additional questions or concerns.   Patient's therapy is appropriate to: Initiate    Goals Addressed             This Visit's Progress    Achieve or maintain remission       Patient is initiating therapy. Patient will maintain adherence        Patient switching from PAP to filling at [facility] (Specialty)  Remi Haggard Specialty Pharmacist

## 2023-02-05 NOTE — Progress Notes (Signed)
Specialty Pharmacy Initial Fill Coordination Note  Ralph [name] is a 58 y.o. male contacted today regarding initial fill of specialty medication(s) Venetoclax [name])  Patient requested Delivery   Delivery date: 02/09/23   Verified address: [address] [place], [place] [address]  Medication will be filled on 02/06/23.   Patient is aware of $0.00 copayment. Bill co-pay card secondary.    Ardeen Fillers, CPhT Oncology Pharmacy Patient Advocate  [facility]  [phone] (phone) [phone] (fax) 02/05/2023 8:54 AM

## 2023-02-06 ENCOUNTER — Other Ambulatory Visit: Payer: Self-pay

## 2023-02-11 ENCOUNTER — Encounter: Payer: Self-pay | Admitting: Internal Medicine

## 2023-02-17 ENCOUNTER — Other Ambulatory Visit: Payer: Self-pay

## 2023-02-17 DIAGNOSIS — C9110 Chronic lymphocytic leukemia of B-cell type not having achieved remission: Secondary | ICD-10-CM

## 2023-02-17 MED FILL — Dexamethasone Sodium Phosphate Inj 100 MG/10ML: INTRAMUSCULAR | Qty: 2 | Status: AC

## 2023-02-18 ENCOUNTER — Inpatient Hospital Stay: Payer: Self-pay | Admitting: Internal Medicine

## 2023-02-18 ENCOUNTER — Inpatient Hospital Stay: Payer: Federal, State, Local not specified - PPO

## 2023-02-18 ENCOUNTER — Encounter: Payer: Self-pay | Admitting: Internal Medicine

## 2023-02-18 ENCOUNTER — Inpatient Hospital Stay: Payer: Self-pay

## 2023-02-18 ENCOUNTER — Inpatient Hospital Stay: Payer: Federal, State, Local not specified - PPO | Admitting: Pharmacist

## 2023-02-18 ENCOUNTER — Inpatient Hospital Stay: Payer: Federal, State, Local not specified - PPO | Attending: Internal Medicine

## 2023-02-18 VITALS — BP 109/73 | HR 69 | Temp 96.1°F | Resp 17

## 2023-02-18 VITALS — BP 118/73 | HR 74 | Temp 98.7°F | Ht 72.0 in | Wt 200.8 lb

## 2023-02-18 DIAGNOSIS — C9110 Chronic lymphocytic leukemia of B-cell type not having achieved remission: Secondary | ICD-10-CM

## 2023-02-18 DIAGNOSIS — Z5112 Encounter for antineoplastic immunotherapy: Secondary | ICD-10-CM | POA: Diagnosis not present

## 2023-02-18 LAB — CMP (CANCER CENTER ONLY)
ALT: 22 U/L (ref 0–44)
AST: 23 U/L (ref 15–41)
Albumin: 4.4 g/dL (ref 3.5–5.0)
Alkaline Phosphatase: 54 U/L (ref 38–126)
Anion gap: 7 (ref 5–15)
BUN: 16 mg/dL (ref 6–20)
CO2: 28 mmol/L (ref 22–32)
Calcium: 9.4 mg/dL (ref 8.9–10.3)
Chloride: 105 mmol/L (ref 98–111)
Creatinine: 0.97 mg/dL (ref 0.61–1.24)
GFR, Estimated: >60 mL/min (ref >60)
Glucose, Bld: 107 mg/dL — ABNORMAL HIGH (ref 70–99)
Potassium: 4.4 mmol/L (ref 3.5–5.1)
Sodium: 140 mmol/L (ref 135–145)
Total Bilirubin: 0.9 mg/dL (ref 0.0–1.2)
Total Protein: 6.9 g/dL (ref 6.5–8.1)

## 2023-02-18 LAB — CBC WITH DIFFERENTIAL (CANCER CENTER ONLY)
Abs Immature Granulocytes: 0.03 10*3/uL (ref 0.00–0.07)
Basophils Absolute: 0.1 10*3/uL (ref 0.0–0.1)
Basophils Relative: 0 %
Eosinophils Absolute: 0.1 10*3/uL (ref 0.0–0.5)
Eosinophils Relative: 1 %
HCT: 35.7 % — ABNORMAL LOW (ref 39.0–52.0)
Hemoglobin: 11 g/dL — ABNORMAL LOW (ref 13.0–17.0)
Immature Granulocytes: 0 %
Lymphocytes Relative: 69 %
Lymphs Abs: 7.6 10*3/uL — ABNORMAL HIGH (ref 0.7–4.0)
MCH: 28.8 pg (ref 26.0–34.0)
MCHC: 30.8 g/dL (ref 30.0–36.0)
MCV: 93.5 fL (ref 80.0–100.0)
Monocytes Absolute: 2.1 10*3/uL — ABNORMAL HIGH (ref 0.1–1.0)
Monocytes Relative: 19 %
Neutro Abs: 1.2 10*3/uL — ABNORMAL LOW (ref 1.7–7.7)
Neutrophils Relative %: 11 %
Platelet Count: 239 10*3/uL (ref 150–400)
RBC: 3.82 MIL/uL — ABNORMAL LOW (ref 4.22–5.81)
RDW: 17.2 % — ABNORMAL HIGH (ref 11.5–15.5)
Smear Review: NORMAL
WBC Count: 11.1 10*3/uL — ABNORMAL HIGH (ref 4.0–10.5)
nRBC: 0 % (ref 0.0–0.2)

## 2023-02-18 LAB — LACTATE DEHYDROGENASE: LDH: 162 U/L (ref 98–192)

## 2023-02-18 LAB — MAGNESIUM: Magnesium: 2.1 mg/dL (ref 1.7–2.4)

## 2023-02-18 LAB — PHOSPHORUS: Phosphorus: 4.4 mg/dL (ref 2.5–4.6)

## 2023-02-18 LAB — URIC ACID: Uric Acid, Serum: 3.2 mg/dL — ABNORMAL LOW (ref 3.7–8.6)

## 2023-02-18 MED ORDER — MONTELUKAST SODIUM 10 MG PO TABS
10.0000 mg | ORAL_TABLET | Freq: Once | ORAL | Status: AC
  Administered 2023-02-18: 10 mg via ORAL
  Filled 2023-02-18: qty 1

## 2023-02-18 MED ORDER — SODIUM CHLORIDE 0.9 % IV SOLN
1000.0000 mg | Freq: Once | INTRAVENOUS | Status: AC
  Administered 2023-02-18: 1000 mg via INTRAVENOUS
  Filled 2023-02-18: qty 40

## 2023-02-18 MED ORDER — ACETAMINOPHEN 325 MG PO TABS
650.0000 mg | ORAL_TABLET | Freq: Once | ORAL | Status: AC
  Administered 2023-02-18: 650 mg via ORAL
  Filled 2023-02-18: qty 2

## 2023-02-18 MED ORDER — SODIUM CHLORIDE 0.9 % IV SOLN
INTRAVENOUS | Status: DC
  Filled 2023-02-18: qty 250

## 2023-02-18 MED ORDER — DIPHENHYDRAMINE HCL 50 MG/ML IJ SOLN
50.0000 mg | Freq: Once | INTRAMUSCULAR | Status: AC
  Administered 2023-02-18: 50 mg via INTRAVENOUS
  Filled 2023-02-18: qty 1

## 2023-02-18 MED ORDER — SODIUM CHLORIDE 0.9 % IV SOLN
20.0000 mg | Freq: Once | INTRAVENOUS | Status: AC
  Administered 2023-02-18: 20 mg via INTRAVENOUS
  Filled 2023-02-18: qty 20

## 2023-02-18 MED ORDER — FAMOTIDINE IN NACL 20-0.9 MG/50ML-% IV SOLN
20.0000 mg | Freq: Once | INTRAVENOUS | Status: AC
  Administered 2023-02-18: 20 mg via INTRAVENOUS
  Filled 2023-02-18: qty 50

## 2023-02-18 NOTE — Progress Notes (Signed)
No concerns today

## 2023-02-18 NOTE — Progress Notes (Addendum)
Clinical Pharmacist Practitioner Clinic [facility]  [phone] [phone]  Patient Care Team: [name], MD as PCP - General (Family Medicine) Earna Coder, MD as Consulting Physician (Oncology)   Name of the patient: Ralph [name]  [id number]  [birth year]   Date of visit: 02/18/23  HPI: Patient is a 58 y.o. male with CLL on obinutuzumab and venetoclax. He started his first cycle of obinutuzumab on 01/20/23. He presents today, in infusion, for cycle 2 and for education in anticipation of starting his venetoclax.   Reason for Consult: Venetoclax oral chemotherapy education.   PAST MEDICAL HISTORY: Past Medical History:  Diagnosis Date   Acute renal failure (HCC)    Anxiety    Bladder outlet obstruction    CLL (chronic lymphocytic leukemia) (HCC)    Elevated PSA    Foreign body of bladder    MS (multiple sclerosis) (HCC)    Pre-diabetes    Prostate cancer (HCC) 2013   UTI (urinary tract infection)     HEMATOLOGY/ONCOLOGY HISTORY:  Oncology History Overview Note  Chronic lymphocytic leukemia (CLL), positive for CD20, CD22, CD19 and  CD38,  see comment.   ANNOTATION COMMENT IMP Comment VC   Comment: (NOTE)  Expression of CD38 on >30% of the clonal B-cells is reported to be an  unfavorable prognostic factor in CLL.     # CLL stage I- [lymphocytosis/retroperitoneal lymphadenopathy up to 2.5 cm-incidental CT protocol- 2023];   APRIL 2024- consistent with trisomy 12. Results for  CCND1/IGH, ATM, 13q and TP53 were normal.   # JAN 14th, 2024- Gazyva+ plan to add Venatoclax with cycle 2 or 3.   #GEN 2023-bilateral severe hydronephrosis-bladder outlet obstruction/status post catheter [Dr.Brandon]  #History of prostate cancer under surveillance [[facility]]   CLL (chronic lymphocytic leukemia) (HCC)  02/11/2021 Initial Diagnosis   CLL (chronic lymphocytic leukemia) (HCC)   01/20/2023 -  Chemotherapy   Patient is on Treatment  Plan : LYMPHOMA CLL/SLL Venetoclax + Obinutuzumab q28d       ALLERGIES:  is allergic to gazyva [obinutuzumab].  MEDICATIONS:  Current Outpatient Medications  Medication Sig Dispense Refill   acyclovir (ZOVIRAX) 400 MG tablet Take 1 tablet (400 mg total) by mouth 2 (two) times daily. 60 tablet 4   allopurinol (ZYLOPRIM) 300 MG tablet Take 1 tablet (300 mg total) by mouth 2 (two) times daily. 60 tablet 4   Multiple Vitamin (MULTIVITAMIN) tablet Take 1 tablet by mouth daily.     ondansetron (ZOFRAN) 8 MG tablet One pill every 8 hours as needed for nausea/vomitting. (Patient not taking: Reported on 02/18/2023) 40 tablet 1   prochlorperazine (COMPAZINE) 10 MG tablet Take 1 tablet (10 mg total) by mouth every 6 (six) hours as needed for nausea or vomiting. (Patient not taking: Reported on 02/18/2023) 40 tablet 1   rosuvastatin (CRESTOR) 5 MG tablet Take 1 tablet by mouth at bedtime.     sulfamethoxazole-trimethoprim (BACTRIM DS) 800-160 MG tablet One pill on Monday;wed;Friday 30 tablet 1   venetoclax (VENCLEXTA) 10 & 50 & 100 MG Starter Pack Take by mouth daily. Take 20 mg for 7 days, then 50 mg daily x 7d, then 100 mg daily x 7d, then 200 mg daily x 7d. Take with food & water. 42 tablet 0   No current facility-administered medications for this visit.   Facility-Administered Medications Ordered in Other Visits  Medication Dose Route Frequency Provider Last Rate Last Admin   0.9 %  sodium chloride infusion   Intravenous Continuous Brahmanday,  Worthy Flank, MD 10 mL/hr at 02/18/23 0938 New Bag at 02/18/23 0938   dexamethasone (DECADRON) 20 mg in sodium chloride 0.9 % 50 mL IVPB  20 mg Intravenous Once Earna Coder, MD 208 mL/hr at 02/18/23 0940 20 mg at 02/18/23 0940   famotidine (PEPCID) IVPB 20 mg premix  20 mg Intravenous Once Earna Coder, MD       obinutuzumab (GAZYVA) 1,000 mg in sodium chloride 0.9 % 250 mL (3.4483 mg/mL) chemo infusion  1,000 mg Intravenous Once Earna Coder, MD        VITAL SIGNS: There were no vitals taken for this visit. There were no vitals filed for this visit.  Estimated body mass index is 27.23 kg/m as calculated from the following:   Height as of an earlier encounter on 02/18/23: 6' (1.829 m).   Weight as of an earlier encounter on 02/18/23: 91.1 kg (200 lb 12.8 oz).  LABS: CBC:    Component Value Date/Time   WBC 11.1 (H) 02/18/2023 0827   WBC 146.5 (HH) 04/24/2022 1029   HGB 11.0 (L) 02/18/2023 0827   HCT 35.7 (L) 02/18/2023 0827   PLT 239 02/18/2023 0827   MCV 93.5 02/18/2023 0827   NEUTROABS 1.2 (L) 02/18/2023 0827   LYMPHSABS 7.6 (H) 02/18/2023 0827   MONOABS 2.1 (H) 02/18/2023 0827   EOSABS 0.1 02/18/2023 0827   BASOSABS 0.1 02/18/2023 0827   Comprehensive Metabolic Panel:    Component Value Date/Time   NA 140 02/18/2023 0827   K 4.4 02/18/2023 0827   CL 105 02/18/2023 0827   CO2 28 02/18/2023 0827   BUN 16 02/18/2023 0827   CREATININE 0.97 02/18/2023 0827   CREATININE 1.04 07/11/2016 1109   GLUCOSE 107 (H) 02/18/2023 0827   CALCIUM 9.4 02/18/2023 0827   AST 23 02/18/2023 0827   ALT 22 02/18/2023 0827   ALKPHOS 54 02/18/2023 0827   BILITOT 0.9 02/18/2023 0827   PROT 6.9 02/18/2023 0827   ALBUMIN 4.4 02/18/2023 0827     Present during today's visit: Patient and his sister   Start plan: Patient to start venetoclax on 02/18/23   Patient Education I spoke with patient for overview of new oral chemotherapy medication: venetoclax    Administration: Counseled patient on administration, dosing, side effects, monitoring, drug-food interactions, safe handling, storage, and disposal. Patient will take 20 mg once daily for 7 days, then 50 mg daily for 7 days, then 100 mg daily for 7 days, then 200 mg daily for 7 days as part of the ramp up series. After completion of the ramp up, he will start venetoclax 400 mg once daily as maintenance. He knows to take venetoclax with food & water.  Side Effects: Side  effects include but not limited to: tumor lysis syndrome, diarrhea, myelosuppression, and N/V.    Drug-drug Interactions (DDI): No DDIs identified   Adherence: After discussion with patient no patient barriers to medication adherence identified.  Reviewed with patient importance of keeping a medication schedule and plan for any missed doses.  He voiced understanding and appreciation. All questions answered. Medication handout provided.  Provided patient with Oral Chemotherapy Navigation Clinic phone number. Patient knows to call the office with questions or concerns. Oral Chemotherapy Navigation Clinic will continue to follow.  Patient expressed understanding and was in agreement with this plan. He also understands that He can call clinic at any time with any questions, concerns, or complaints.   Medication Access Issues: None, was able to obtain  venetoclax starter pack at [facility]   Follow-up plan: RTC tomorrow for f/u labs   Thank you for allowing me to participate in the care of this patient.   Time Total: 20 minutes   Visit consisted of counseling and education on dealing with issues of symptom management in the setting of serious and potentially life-threatening illness.Greater than 50% of this time was spent counseling and coordinating care related to the above assessment and plan.  Signed by: Jerry Caras, PharmD PGY2 Oncology Pharmacy Resident   02/18/2023 10:13 AM

## 2023-02-18 NOTE — Progress Notes (Signed)
Nutrition Assessment   Reason for Assessment:  Patient identified on Malnutrition Screening report for weight loss   ASSESSMENT:  58 year old male with CLL.  Past medical history of multiple sclerosis, pre-DM, prostate cancer.  Patient receiving gazyva.    Met with patient during infusion.  Reports good appetite.  Says that his taste is a little bit off but still able to eat and enjoy foods.  Currently living with mother and she is preparing meals.  Breakfast is eggs, toast, fruit, water, saltine crackers.  Lunch is fruit, vegetables, baked chicken.  Supper is similar to lunch.  Eats sandwiches and also sun chips.  Has a bedtime snack.  Drinks water.   Medications: reviewed   Labs: reviewed   Anthropometrics:   Height: 72 inches Weight: 200 lb 12.8 oz 212 lb 8 oz on 1/11 BMI: 30  194 lb 3.2 oz on 01/19/23 Decreased to 184 lb on 1/27 but weight gain today.   6% weight loss in the last year, not significant  Estimated Energy Needs  Kcals: 2275-2700 Protein: 114-135 g Fluid: 2275-2700 ml   NUTRITION DIAGNOSIS: none at this time    INTERVENTION:  Discussed importance of nutrition during treatment Encouraged protein source at every meal.  Discussed examples of foods that contain protein (plant based and animal).   Encouraged well balanced diet including plant foods Continue water for hydration. Contact information provided   MONITORING, EVALUATION, GOAL: weight trends, intake   Next Visit: Wednesday, Mar 12 during infusion  Lometa Riggin B. Freida Busman, RD, LDN Registered Dietitian [phone]

## 2023-02-18 NOTE — Assessment & Plan Note (Addendum)
#  CLL-CD38 positive stage III [retroperitoneal lymph nodes]-Asymptomatic. IGVH- UNMUTATED; JULY  FISH panel- 12 trisomy-   DEC 18th, 2024-  up to 17 mm- Stable lymphadenopathy involving the chest, abdomen and pelvis. enlargement of the spleen. Currently fixed duration therapy- Gazyva-[awaiting to start Venatoclax with plan to start cycle #2]  # proceed with Gazyva cycle #2 day. Labs-CBC/chemistries were reviewed with the patient. -Start venetoclax today- Will monitor closely for TLS. Discussed with [facility], Pharmacy.   # Gazyva infusion- G-1-2- improved post benadryl/ solumedrol will monitor closely with subsequent cycles- stable.    #History of urinary retention /prostatomegaly /prostate cancer  [Dr.Brandon]incidental-  2.1 x 0.9 cm stone or calcified mucosal lesion posterior left bladder wall in the region of the left UVJ.  Dr.Brandon.  stable.    # CKD stage II-III-  stable.    # MS clinically  stable.     # Secondary cancers: colo-[march 2024; [place]]; recommend surveillaince with dermatology- stable. .   # Vaccination: discussed re: Flu shots; pneumonia vaccination [s/p Shigles vacciation- in past]- stable.   # Hx of Gout Elevated uric acid 9.5-second of CLL-on allopurinol increased dose to 300 BID- stable.   # ID prophylaxis: continue Bactrim DS; and Acyclovir.   # DISPOSITION: # Shelly Bombard today-  # As planned tomorrow for labs- # follow up in 4 weeks- MD; labs- cbc/cmp; LDH;  mag; phos;uric acid   Gazyva-  Dr.B

## 2023-02-18 NOTE — Progress Notes (Signed)
[facility] CONSULT NOTE  Patient Care Team: [name], MD as PCP - General (Family Medicine) Earna Coder, MD as Consulting Physician (Oncology)  CHIEF COMPLAINTS/PURPOSE OF CONSULTATION: CLL  Oncology History Overview Note  Chronic lymphocytic leukemia (CLL), positive for CD20, CD22, CD19 and  CD38,  see comment.   ANNOTATION COMMENT IMP Comment VC   Comment: (NOTE)  Expression of CD38 on >30% of the clonal B-cells is reported to be an  unfavorable prognostic factor in CLL.     # CLL stage I- [lymphocytosis/retroperitoneal lymphadenopathy up to 2.5 cm-incidental CT protocol- 2023];   APRIL 2024- consistent with trisomy 12. Results for  CCND1/IGH, ATM, 13q and TP53 were normal.   # JAN 14th, 2024- Gazyva+ plan to add Venatoclax with cycle 2 or 3.   #GEN 2023-bilateral severe hydronephrosis-bladder outlet obstruction/status post catheter [Dr.Brandon]  #History of prostate cancer under surveillance [[facility]]   CLL (chronic lymphocytic leukemia) (HCC)  02/11/2021 Initial Diagnosis   CLL (chronic lymphocytic leukemia) (HCC)   01/20/2023 -  Chemotherapy   Patient is on Treatment Plan : LYMPHOMA CLL/SLL Venetoclax + Obinutuzumab q28d      HISTORY OF PRESENTING ILLNESS: Alone.  Ambulating independently.  Ralph [name] 58 y.o.  male history of multiple sclerosis-not on active therapy; prostate cancer on surveillance; CLL currently on [place] to start Venatoclax with cycle #2] is here for a follow-up.  Patient is doing well.  No fevers or cough. Denies any fevers or chills.   Appetite is good.  Gained weight.  Denies any lumps or bumps.    Review of Systems  Constitutional:  Positive for weight loss. Negative for chills, diaphoresis, fever and malaise/fatigue.  HENT:  Negative for nosebleeds and sore throat.   Eyes:  Negative for double vision.  Respiratory:  Negative for cough, hemoptysis, sputum production, shortness of breath and  wheezing.   Cardiovascular:  Negative for chest pain, palpitations, orthopnea and leg swelling.  Gastrointestinal:  Negative for abdominal pain, blood in stool, constipation, diarrhea, heartburn, melena, nausea and vomiting.  Genitourinary:  Negative for dysuria, frequency and urgency.  Musculoskeletal:  Negative for back pain and joint pain.  Skin: Negative.  Negative for itching and rash.  Neurological:  Negative for dizziness, tingling, focal weakness, weakness and headaches.  Endo/Heme/Allergies:  Does not bruise/bleed easily.  Psychiatric/Behavioral:  Negative for depression. The patient is not nervous/anxious and does not have insomnia.      MEDICAL HISTORY:  Past Medical History:  Diagnosis Date   Acute renal failure (HCC)    Anxiety    Bladder outlet obstruction    CLL (chronic lymphocytic leukemia) (HCC)    Elevated PSA    Foreign body of bladder    MS (multiple sclerosis) (HCC)    Pre-diabetes    Prostate cancer (HCC) 2013   UTI (urinary tract infection)     SURGICAL HISTORY: Past Surgical History:  Procedure Laterality Date   CYSTOSCOPY N/A 12/16/2021   Procedure: CYSTOSCOPY WITH REMOVAL OF FOREIGN BODY;  Surgeon: Vanna Scotland, MD;  Location: [facility] ORS;  Service: Urology;  Laterality: N/A;   HOLEP-LASER ENUCLEATION OF THE PROSTATE WITH MORCELLATION N/A 03/11/2021   Procedure: HOLEP-LASER ENUCLEATION OF THE PROSTATE WITH MORCELLATION;  Surgeon: Vanna Scotland, MD;  Location: [facility] ORS;  Service: Urology;  Laterality: N/A;   KNEE SURGERY  2002   meniscus   TRANSURETHRAL RESECTION OF PROSTATE N/A 12/16/2021   Procedure: TRANSURETHRAL RESECTION OF THE PROSTATE (TURP);  Surgeon: Vanna Scotland, MD;  Location: [facility] ORS;  Service: Urology;  Laterality: N/A;    SOCIAL HISTORY: Social History   Socioeconomic History   Marital status: Single    Spouse name: Not on file   Number of children: 0   Years of education: College   Highest education level: Not on file   Occupational History   Occupation: Research officer, 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  Denies any alcohol; lives in [place].    Social Drivers of Health   Financial Resource Strain: Low Risk  (08/27/2022)   Received from [facility]   Overall Financial Resource Strain (CARDIA)    Difficulty of Paying Living Expenses: Not hard at all  Food Insecurity: No Food Insecurity (08/27/2022)   Received from [facility]   Hunger Vital Sign    Worried About Running Out of Food in the Last Year: Never true    Ran Out of Food in the Last Year: Never true  Transportation Needs: No Transportation Needs (08/27/2022)   Received from [facility] - Transportation    In the past 12 months, has lack of transportation kept you from medical appointments or from getting medications?: No    Lack of Transportation (Non-Medical): No  Physical Activity: Not on file  Stress: Not on file  Social Connections: Not on file  Intimate Partner Violence: Not on file    FAMILY HISTORY: Family History  Problem Relation Age of Onset   Cancer Sister        breast CA     ALLERGIES:  is allergic to gazyva [obinutuzumab].  MEDICATIONS:  Current Outpatient Medications  Medication Sig Dispense Refill   acyclovir (ZOVIRAX) 400 MG tablet Take 1 tablet (400 mg total) by mouth 2 (two) times daily. 60 tablet 4   allopurinol (ZYLOPRIM) 300 MG tablet Take 1 tablet (300 mg total) by mouth 2 (two) times daily. 60 tablet 4   Multiple Vitamin  (MULTIVITAMIN) tablet Take 1 tablet by mouth daily.     rosuvastatin (CRESTOR) 5 MG tablet Take 1 tablet by mouth at bedtime.     sulfamethoxazole-trimethoprim (BACTRIM DS) 800-160 MG tablet One pill on Monday;wed;Friday 30 tablet 1   venetoclax (VENCLEXTA) 10 & 50 & 100 MG Starter Pack Take by mouth daily. Take 20 mg for 7 days, then 50 mg daily x 7d, then 100 mg daily x 7d, then 200 mg daily x 7d. Take with food & water. 42 tablet 0   ondansetron (ZOFRAN) 8 MG tablet One pill every 8 hours as needed for nausea/vomitting. (Patient not taking: Reported on 02/18/2023) 40 tablet 1   prochlorperazine (COMPAZINE) 10 MG tablet Take 1 tablet (10 mg total) by mouth every 6 (six) hours as needed for nausea or vomiting. (Patient not taking: Reported on 02/18/2023) 40 tablet 1   No current facility-administered medications for this visit.      [name]  PHYSICAL EXAMINATION:  Vitals:   02/18/23 0839  BP:  118/73  Pulse: 74  Temp: 98.7 F (37.1 C)  SpO2: 100%     Filed Weights   02/18/23 0839  Weight: 200 lb 12.8 oz (91.1 kg)   Shotty bilateral neck adenopathy.  1 to 2 cm lymph node bilateral axilla left more than right.  Mild lymphadenopathy in the right inguinal region- improvement.   Physical Exam Vitals and nursing note reviewed.  HENT:     Head: Normocephalic and atraumatic.     Mouth/Throat:     Pharynx: Oropharynx is clear.  Eyes:     Extraocular Movements: Extraocular movements intact.     Pupils: Pupils are equal, round, and reactive to light.  Cardiovascular:     Rate and Rhythm: Normal rate and regular rhythm.  Pulmonary:     Comments: Decreased breath sounds bilaterally.  Abdominal:     Palpations: Abdomen is soft.  Musculoskeletal:        General: Normal range of motion.     Cervical back: Normal range of motion.  Skin:    General: Skin is warm.  Neurological:     General: No focal deficit present.     Mental Status: He is alert and oriented to person, place, and time.   Psychiatric:        Behavior: Behavior normal.        Judgment: Judgment normal.     LABORATORY DATA:  I have reviewed the data as listed Lab Results  Component Value Date   WBC 11.1 (H) 02/18/2023   HGB 11.0 (L) 02/18/2023   HCT 35.7 (L) 02/18/2023   MCV 93.5 02/18/2023   PLT 239 02/18/2023   Recent Labs    01/26/23 0842 02/02/23 0847 02/18/23 0827  NA 138 140 140  K 4.2 4.1 4.4  CL 104 105 105  CO2 25 26 28   GLUCOSE 184* 166* 107*  BUN 29* 19 16  CREATININE 1.03 0.99 0.97  CALCIUM 9.0 9.2 9.4  GFRNONAA >60 >60 >60  PROT 6.7 6.8 6.9  ALBUMIN 3.9 4.2 4.4  AST 23 18 23   ALT 37 20 22  ALKPHOS 59 53 54  BILITOT 0.9 1.1 0.9    RADIOGRAPHIC STUDIES: I have personally reviewed the radiological images as listed and agreed with the findings in the report. No results found.   CLL (chronic lymphocytic leukemia) (HCC) #CLL-CD38 positive stage III [retroperitoneal lymph nodes]-Asymptomatic. IGVH- UNMUTATED; JULY  FISH panel- 12 trisomy-   DEC 18th, 2024-  up to 17 mm- Stable lymphadenopathy involving the chest, abdomen and pelvis. enlargement of the spleen. Currently fixed duration therapy- Gazyva-[awaiting to start Venatoclax with plan to start cycle #2]  # proceed with Gazyva cycle #2 day. Labs-CBC/chemistries were reviewed with the patient. -Start venetoclax today- Will monitor closely for TLS. Discussed with [facility], Pharmacy.   # Gazyva infusion- G-1-2- improved post benadryl/ solumedrol will monitor closely with subsequent cycles- stable.    #History of urinary retention /prostatomegaly /prostate cancer  [Dr.Brandon]incidental-  2.1 x 0.9 cm stone or calcified mucosal lesion posterior left bladder wall in the region of the left UVJ.  Dr.Brandon.  stable.    # CKD stage II-III-  stable.    # MS clinically  stable.     # Secondary cancers: colo-[march 2024; [place]]; recommend surveillaince with dermatology- stable. .   # Vaccination: discussed re: Flu shots;  pneumonia vaccination [s/p Shigles vacciation- in past]- stable.   # Hx of Gout Elevated uric acid 9.5-second of CLL-on allopurinol increased dose to 300 BID-  stable.   # ID prophylaxis: continue Bactrim DS; and Acyclovir.   # DISPOSITION: # Shelly Bombard today-  # As planned tomorrow for labs- # follow up in 4 weeks- MD; labs- cbc/cmp; LDH;  mag; phos;uric acid   Gazyva-  Dr.B    All questions were answered. The patient knows to call the clinic with any problems, questions or concerns.       Earna Coder, MD 02/18/2023 9:13 AM

## 2023-02-18 NOTE — Patient Instructions (Signed)
[facility] - A DEPT OF [facility]  Discharge Instructions: Thank you for choosing [facility] to provide your oncology and hematology care.  If you have a lab appointment with the Cancer Center, please go directly to the Cancer Center and check in at the registration area.  Wear comfortable clothing and clothing appropriate for easy access to any Portacath or PICC line.   We strive to give you quality time with your provider. You may need to reschedule your appointment if you arrive late (15 or more minutes).  Arriving late affects you and other patients whose appointments are after yours.  Also, if you miss three or more appointments without notifying the office, you may be dismissed from the clinic at the provider's discretion.      For prescription refill requests, have your pharmacy contact our office and allow 72 hours for refills to be completed.    Today you received the following chemotherapy and/or immunotherapy agents Gazyva      To help prevent nausea and vomiting after your treatment, we encourage you to take your nausea medication as directed.  BELOW ARE SYMPTOMS THAT SHOULD BE REPORTED IMMEDIATELY: *FEVER GREATER THAN 100.4 F (38 C) OR HIGHER *CHILLS OR SWEATING *NAUSEA AND VOMITING THAT IS NOT CONTROLLED WITH YOUR NAUSEA MEDICATION *UNUSUAL SHORTNESS OF BREATH *UNUSUAL BRUISING OR BLEEDING *URINARY PROBLEMS (pain or burning when urinating, or frequent urination) *BOWEL PROBLEMS (unusual diarrhea, constipation, pain near the anus) TENDERNESS IN MOUTH AND THROAT WITH OR WITHOUT PRESENCE OF ULCERS (sore throat, sores in mouth, or a toothache) UNUSUAL RASH, SWELLING OR PAIN  UNUSUAL VAGINAL DISCHARGE OR ITCHING   Items with * indicate a potential emergency and should be followed up as soon as possible or go to the Emergency Department if any problems should occur.  Please show the CHEMOTHERAPY ALERT CARD or IMMUNOTHERAPY ALERT  CARD at check-in to the Emergency Department and triage nurse.  Should you have questions after your visit or need to cancel or reschedule your appointment, please contact [facility] - A DEPT OF [facility]  [phone] and follow the prompts.  Office hours are 8:00 a.m. to 4:30 p.m. Monday - Friday. Please note that voicemails left after 4:00 p.m. may not be returned until the following business day.  We are closed weekends and major holidays. You have access to a nurse at all times for urgent questions. Please call the main number to the clinic [phone] and follow the prompts.  For any non-urgent questions, you may also contact your provider using MyChart. We now offer e-Visits for anyone 49 and older to request care online for non-urgent symptoms. For details visit [email or web address].   Also download the MyChart app! Go to the app store, search "MyChart", open the app, select [place], and log in with your MyChart username and password.

## 2023-02-19 ENCOUNTER — Inpatient Hospital Stay: Payer: Self-pay

## 2023-02-19 DIAGNOSIS — C9110 Chronic lymphocytic leukemia of B-cell type not having achieved remission: Secondary | ICD-10-CM

## 2023-02-19 DIAGNOSIS — Z5112 Encounter for antineoplastic immunotherapy: Secondary | ICD-10-CM | POA: Diagnosis not present

## 2023-02-19 LAB — BASIC METABOLIC PANEL - CANCER CENTER ONLY
Anion gap: 8 (ref 5–15)
BUN: 22 mg/dL — ABNORMAL HIGH (ref 6–20)
CO2: 25 mmol/L (ref 22–32)
Calcium: 9.4 mg/dL (ref 8.9–10.3)
Chloride: 107 mmol/L (ref 98–111)
Creatinine: 1.05 mg/dL (ref 0.61–1.24)
GFR, Estimated: >60 mL/min (ref >60)
Glucose, Bld: 115 mg/dL — ABNORMAL HIGH (ref 70–99)
Potassium: 4.2 mmol/L (ref 3.5–5.1)
Sodium: 140 mmol/L (ref 135–145)

## 2023-02-19 LAB — PHOSPHORUS: Phosphorus: 5 mg/dL — ABNORMAL HIGH (ref 2.5–4.6)

## 2023-02-19 LAB — LACTATE DEHYDROGENASE: LDH: 186 U/L (ref 98–192)

## 2023-02-19 LAB — URIC ACID: Uric Acid, Serum: 3.3 mg/dL — ABNORMAL LOW (ref 3.7–8.6)

## 2023-02-19 LAB — MAGNESIUM: Magnesium: 2.1 mg/dL (ref 1.7–2.4)

## 2023-03-02 ENCOUNTER — Other Ambulatory Visit: Payer: Self-pay

## 2023-03-02 ENCOUNTER — Other Ambulatory Visit (HOSPITAL_COMMUNITY): Payer: Self-pay

## 2023-03-02 ENCOUNTER — Other Ambulatory Visit: Payer: Self-pay | Admitting: Internal Medicine

## 2023-03-02 DIAGNOSIS — C9110 Chronic lymphocytic leukemia of B-cell type not having achieved remission: Secondary | ICD-10-CM

## 2023-03-02 NOTE — Progress Notes (Signed)
 Specialty Pharmacy Refill Coordination Note  Ralph [name] is a 58 y.o. male contacted today regarding refills of specialty medication(s) Venetoclax Ralph [name])   Patient requested Delivery   Delivery date: 03/13/23   Verified address: [address],  [place] [address]   Medication will be filled on 03/12/23. This fill date is pending response to refill request from provider. Patient is aware and if they have not received fill by intended date they must follow up with pharmacy.

## 2023-03-02 NOTE — Progress Notes (Signed)
 Specialty Pharmacy Ongoing Clinical Assessment Note  Ralph [name] is a 58 y.o. male who is being followed by the specialty pharmacy service for RxSp Oncology   Patient's specialty medication(s) reviewed today: Venetoclax (VENCLEXTA)   Missed doses in the last 4 weeks: 0   Patient/Caregiver did not have any additional questions or concerns.   Therapeutic benefit summary: Patient is achieving benefit   Adverse events/side effects summary: Experienced adverse events/side effects (diarrhea on the first day, resolved)   Patient's therapy is appropriate to: Continue    Goals Addressed             This Visit's Progress    Achieve or maintain remission   No change    Patient is initiating therapy. Patient will maintain adherence         Follow up:  3 months  Servando Snare Specialty Pharmacist

## 2023-03-05 ENCOUNTER — Other Ambulatory Visit (HOSPITAL_COMMUNITY): Payer: Self-pay

## 2023-03-05 ENCOUNTER — Other Ambulatory Visit: Payer: Self-pay

## 2023-03-05 ENCOUNTER — Encounter: Payer: Self-pay | Admitting: Internal Medicine

## 2023-03-05 MED ORDER — VENCLEXTA STARTING PACK 10 & 50 & 100 MG PO TBPK: ORAL_TABLET | ORAL | 0 refills | Status: DC

## 2023-03-05 MED ORDER — VENETOCLAX 100 MG PO TABS
400.0000 mg | ORAL_TABLET | Freq: Every day | ORAL | 0 refills | Status: DC
  Filled 2023-03-05: qty 120, 30d supply, fill #0

## 2023-03-05 NOTE — Addendum Note (Signed)
 Addended by: Remi Haggard on: 03/05/2023 02:06 PM   Modules accepted: Orders

## 2023-03-09 ENCOUNTER — Other Ambulatory Visit (INDEPENDENT_AMBULATORY_CARE_PROVIDER_SITE_OTHER): Payer: Self-pay

## 2023-03-09 ENCOUNTER — Ambulatory Visit: Payer: Federal, State, Local not specified - PPO | Admitting: Orthopedic Surgery

## 2023-03-09 DIAGNOSIS — M79671 Pain in right foot: Secondary | ICD-10-CM | POA: Diagnosis not present

## 2023-03-12 ENCOUNTER — Other Ambulatory Visit: Payer: Self-pay

## 2023-03-17 MED FILL — Dexamethasone Sodium Phosphate Inj 100 MG/10ML: INTRAMUSCULAR | Qty: 2 | Status: AC

## 2023-03-18 ENCOUNTER — Inpatient Hospital Stay: Payer: Federal, State, Local not specified - PPO

## 2023-03-18 ENCOUNTER — Inpatient Hospital Stay: Payer: Federal, State, Local not specified - PPO | Admitting: Internal Medicine

## 2023-03-18 ENCOUNTER — Inpatient Hospital Stay: Payer: Federal, State, Local not specified - PPO | Attending: Internal Medicine

## 2023-03-18 ENCOUNTER — Encounter: Payer: Self-pay | Admitting: Internal Medicine

## 2023-03-18 VITALS — BP 113/74 | HR 72 | Temp 97.3°F | Resp 16 | Wt 203.0 lb

## 2023-03-18 DIAGNOSIS — C9110 Chronic lymphocytic leukemia of B-cell type not having achieved remission: Secondary | ICD-10-CM | POA: Diagnosis not present

## 2023-03-18 DIAGNOSIS — Z7969 Long term (current) use of other immunomodulators and immunosuppressants: Secondary | ICD-10-CM | POA: Insufficient documentation

## 2023-03-18 DIAGNOSIS — Z5112 Encounter for antineoplastic immunotherapy: Secondary | ICD-10-CM | POA: Insufficient documentation

## 2023-03-18 DIAGNOSIS — Z79624 Long term (current) use of inhibitors of nucleotide synthesis: Secondary | ICD-10-CM | POA: Diagnosis not present

## 2023-03-18 DIAGNOSIS — Z79899 Other long term (current) drug therapy: Secondary | ICD-10-CM | POA: Diagnosis not present

## 2023-03-18 LAB — CMP (CANCER CENTER ONLY)
ALT: 14 U/L (ref 0–44)
AST: 18 U/L (ref 15–41)
Albumin: 4.2 g/dL (ref 3.5–5.0)
Alkaline Phosphatase: 50 U/L (ref 38–126)
Anion gap: 8 (ref 5–15)
BUN: 14 mg/dL (ref 6–20)
CO2: 28 mmol/L (ref 22–32)
Calcium: 9.1 mg/dL (ref 8.9–10.3)
Chloride: 104 mmol/L (ref 98–111)
Creatinine: 0.89 mg/dL (ref 0.61–1.24)
GFR, Estimated: >60 mL/min (ref >60)
Glucose, Bld: 107 mg/dL — ABNORMAL HIGH (ref 70–99)
Potassium: 4 mmol/L (ref 3.5–5.1)
Sodium: 140 mmol/L (ref 135–145)
Total Bilirubin: 0.8 mg/dL (ref 0.0–1.2)
Total Protein: 6.5 g/dL (ref 6.5–8.1)

## 2023-03-18 LAB — CBC WITH DIFFERENTIAL (CANCER CENTER ONLY)
Abs Immature Granulocytes: 0.01 10*3/uL (ref 0.00–0.07)
Basophils Absolute: 0 10*3/uL (ref 0.0–0.1)
Basophils Relative: 1 %
Eosinophils Absolute: 0 10*3/uL (ref 0.0–0.5)
Eosinophils Relative: 0 %
HCT: 35.7 % — ABNORMAL LOW (ref 39.0–52.0)
Hemoglobin: 10.9 g/dL — ABNORMAL LOW (ref 13.0–17.0)
Immature Granulocytes: 1 %
Lymphocytes Relative: 61 %
Lymphs Abs: 1.2 10*3/uL (ref 0.7–4.0)
MCH: 26.9 pg (ref 26.0–34.0)
MCHC: 30.5 g/dL (ref 30.0–36.0)
MCV: 88.1 fL (ref 80.0–100.0)
Monocytes Absolute: 0.5 10*3/uL (ref 0.1–1.0)
Monocytes Relative: 23 %
Neutro Abs: 0.3 10*3/uL — CL (ref 1.7–7.7)
Neutrophils Relative %: 14 %
Platelet Count: 303 10*3/uL (ref 150–400)
RBC: 4.05 MIL/uL — ABNORMAL LOW (ref 4.22–5.81)
RDW: 15.5 % (ref 11.5–15.5)
WBC Count: 2 10*3/uL — ABNORMAL LOW (ref 4.0–10.5)
nRBC: 0 % (ref 0.0–0.2)

## 2023-03-18 LAB — MAGNESIUM: Magnesium: 2 mg/dL (ref 1.7–2.4)

## 2023-03-18 LAB — LACTATE DEHYDROGENASE: LDH: 126 U/L (ref 98–192)

## 2023-03-18 LAB — URIC ACID: Uric Acid, Serum: 2.8 mg/dL — ABNORMAL LOW (ref 3.7–8.6)

## 2023-03-18 LAB — PHOSPHORUS: Phosphorus: 4.2 mg/dL (ref 2.5–4.6)

## 2023-03-18 NOTE — Progress Notes (Signed)
 [name] CONSULT NOTE  Patient Care Team: [name], MD as PCP - General (Family Medicine) Earna Coder, MD as Consulting Physician (Oncology)  CHIEF COMPLAINTS/PURPOSE OF CONSULTATION: CLL  Oncology History Overview Note  Chronic lymphocytic leukemia (CLL), positive for CD20, CD22, CD19 and  CD38,  see comment.   ANNOTATION COMMENT IMP Comment VC   Comment: (NOTE)  Expression of CD38 on >30% of the clonal B-cells is reported to be an  unfavorable prognostic factor in CLL.     # CLL stage I- [lymphocytosis/retroperitoneal lymphadenopathy up to 2.5 cm-incidental CT protocol- 2023];   APRIL 2024- consistent with trisomy 12. Results for  CCND1/IGH, ATM, 13q and TP53 were normal.   # JAN 14th, 2024- Gazyva+ plan to add Venatoclax with cycle 2 or 3.   #GEN 2023-bilateral severe hydronephrosis-bladder outlet obstruction/status post catheter [Dr.Brandon]  #History of prostate cancer under surveillance [[facility]]   CLL (chronic lymphocytic leukemia) (HCC)  02/11/2021 Initial Diagnosis   CLL (chronic lymphocytic leukemia) (HCC)   01/20/2023 -  Chemotherapy   Patient is on Treatment Plan : LYMPHOMA CLL/SLL Venetoclax + Obinutuzumab q28d      HISTORY OF PRESENTING ILLNESS: Alone.  Ambulating independently.  Ralph [name] 58 y.o.  male history of multiple sclerosis-not on active therapy; prostate cancer on surveillance; CLL currently on [place] [ started Venatoclax with cycle #2] is here for a follow-up.  Appetite is good, energy is reported as good. No cold symptoms, no fevers at home. No pain. No diarrhea. Denies neuropathy.   Gained weight.  Denies any lumps or bumps.    Review of Systems  Constitutional:  Positive for weight loss. Negative for chills, diaphoresis, fever and malaise/fatigue.  HENT:  Negative for nosebleeds and sore throat.   Eyes:  Negative for double vision.  Respiratory:  Negative for cough, hemoptysis, sputum production,  shortness of breath and wheezing.   Cardiovascular:  Negative for chest pain, palpitations, orthopnea and leg swelling.  Gastrointestinal:  Negative for abdominal pain, blood in stool, constipation, diarrhea, heartburn, melena, nausea and vomiting.  Genitourinary:  Negative for dysuria, frequency and urgency.  Musculoskeletal:  Negative for back pain and joint pain.  Skin: Negative.  Negative for itching and rash.  Neurological:  Negative for dizziness, tingling, focal weakness, weakness and headaches.  Endo/Heme/Allergies:  Does not bruise/bleed easily.  Psychiatric/Behavioral:  Negative for depression. The patient is not nervous/anxious and does not have insomnia.      MEDICAL HISTORY:  Past Medical History:  Diagnosis Date   Acute renal failure (HCC)    Anxiety    Bladder outlet obstruction    CLL (chronic lymphocytic leukemia) (HCC)    Elevated PSA    Foreign body of bladder    MS (multiple sclerosis) (HCC)    Pre-diabetes    Prostate cancer (HCC) 2013   UTI (urinary tract infection)     SURGICAL HISTORY: Past Surgical History:  Procedure Laterality Date   CYSTOSCOPY N/A 12/16/2021   Procedure: CYSTOSCOPY WITH REMOVAL OF FOREIGN BODY;  Surgeon: Vanna Scotland, MD;  Location: [facility] ORS;  Service: Urology;  Laterality: N/A;   HOLEP-LASER ENUCLEATION OF THE PROSTATE WITH MORCELLATION N/A 03/11/2021   Procedure: HOLEP-LASER ENUCLEATION OF THE PROSTATE WITH MORCELLATION;  Surgeon: Vanna Scotland, MD;  Location: [facility] ORS;  Service: Urology;  Laterality: N/A;   KNEE SURGERY  2002   meniscus   TRANSURETHRAL RESECTION OF PROSTATE N/A 12/16/2021   Procedure: TRANSURETHRAL RESECTION OF THE PROSTATE (TURP);  Surgeon: Vanna Scotland,  MD;  Location: [facility] ORS;  Service: Urology;  Laterality: N/A;    SOCIAL HISTORY: Social History   Socioeconomic History   Marital status: Single    Spouse name: Not on file   Number of children: 0   Years of education: College   Highest education  level: Not on file  Occupational History   Occupation: Research officer, 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  Denies any alcohol; lives in [place].    Social Drivers of Health   Financial Resource Strain: Low Risk  (08/27/2022)   Received from [facility]   Overall Financial Resource Strain (CARDIA)    Difficulty of Paying Living Expenses: Not hard at all  Food Insecurity: No Food Insecurity (08/27/2022)   Received from [facility]   Hunger Vital Sign    Worried About Running Out of Food in the Last Year: Never true    Ran Out of Food in the Last Year: Never true  Transportation Needs: No Transportation Needs (08/27/2022)   Received from [facility] - Transportation    In the past 12 months, has lack of transportation kept you from medical appointments or from getting medications?: No    Lack of Transportation (Non-Medical): No  Physical Activity: Not on file  Stress: Not on file  Social Connections: Not on file  Intimate Partner Violence: Not on file    FAMILY HISTORY: Family History  Problem Relation Age of Onset   Cancer Sister        breast CA     ALLERGIES:  is allergic to gazyva [obinutuzumab].  MEDICATIONS:  Current Outpatient Medications  Medication Sig Dispense Refill   acyclovir (ZOVIRAX) 400 MG tablet Take 1 tablet (400 mg total) by mouth 2 (two) times daily. 60 tablet 4   allopurinol (ZYLOPRIM) 300 MG tablet Take 1 tablet (300 mg total) by mouth 2 (two) times daily. 60 tablet 4   Multiple  Vitamin (MULTIVITAMIN) tablet Take 1 tablet by mouth daily.     rosuvastatin (CRESTOR) 5 MG tablet Take 1 tablet by mouth at bedtime.     sulfamethoxazole-trimethoprim (BACTRIM DS) 800-160 MG tablet One pill on Monday;wed;Friday 30 tablet 1   venetoclax (VENCLEXTA) 100 MG tablet Take 4 tablets (400 mg total) by mouth daily. Tablets should be swallowed whole with a meal and a full glass of water. 120 tablet 0   ondansetron (ZOFRAN) 8 MG tablet One pill every 8 hours as needed for nausea/vomitting. (Patient not taking: Reported on 01/26/2023) 40 tablet 1   prochlorperazine (COMPAZINE) 10 MG tablet Take 1 tablet (10 mg total) by mouth every 6 (six) hours as needed for nausea or vomiting. (Patient not taking: Reported on 01/26/2023) 40 tablet 1   No current facility-administered medications for this visit.    PHYSICAL EXAMINATION:  Vitals:   03/18/23 0826  BP: 113/74  Pulse: 72  Resp: 16  Temp: (!) 97.3 F (36.3 C)  SpO2: 100%  Filed Weights   03/18/23 0826  Weight: 203 lb (92.1 kg)   At Baseline- Shotty bilateral neck adenopathy.  1 to 2 cm lymph node bilateral axilla left more than right.  Mild lymphadenopathy in the right inguinal region  3/12- resolution of lymphadenopathy  Physical Exam Vitals and nursing note reviewed.  HENT:     Head: Normocephalic and atraumatic.     Mouth/Throat:     Pharynx: Oropharynx is clear.  Eyes:     Extraocular Movements: Extraocular movements intact.     Pupils: Pupils are equal, round, and reactive to light.  Cardiovascular:     Rate and Rhythm: Normal rate and regular rhythm.  Pulmonary:     Comments: Decreased breath sounds bilaterally.  Abdominal:     Palpations: Abdomen is soft.  Musculoskeletal:        General: Normal range of motion.     Cervical back: Normal range of motion.  Skin:    General: Skin is warm.  Neurological:     General: No focal deficit present.     Mental Status: He is alert and oriented to person, place, and  time.  Psychiatric:        Behavior: Behavior normal.        Judgment: Judgment normal.     LABORATORY DATA:  I have reviewed the data as listed Lab Results  Component Value Date   WBC 2.0 (L) 03/18/2023   HGB 10.9 (L) 03/18/2023   HCT 35.7 (L) 03/18/2023   MCV 88.1 03/18/2023   PLT 303 03/18/2023   Recent Labs    02/02/23 0847 02/18/23 0827 02/19/23 0915 03/18/23 0805  NA 140 140 140 140  K 4.1 4.4 4.2 4.0  CL 105 105 107 104  CO2 26 28 25 28   GLUCOSE 166* 107* 115* 107*  BUN 19 16 22* 14  CREATININE 0.99 0.97 1.05 0.89  CALCIUM 9.2 9.4 9.4 9.1  GFRNONAA >60 >60 >60 >60  PROT 6.8 6.9  --  6.5  ALBUMIN 4.2 4.4  --  4.2  AST 18 23  --  18  ALT 20 22  --  14  ALKPHOS 53 54  --  50  BILITOT 1.1 0.9  --  0.8    RADIOGRAPHIC STUDIES: I have personally reviewed the radiological images as listed and agreed with the findings in the report. No results found.   CLL (chronic lymphocytic leukemia) (HCC) #CLL-CD38 positive stage III [retroperitoneal lymph nodes]-Asymptomatic. IGVH- UNMUTATED; JULY  FISH panel- 12 trisomy-   DEC 18th, 2024-  up to 17 mm- Stable lymphadenopathy involving the chest, abdomen and pelvis. enlargement of the spleen. Currently fixed duration therapyShelly Bombard- started Venatoclax with cycle #2]  # HOLD  Gazyva cycle #3 day. Labs-CBC/chemistries- ANC 0.3.  were reviewed with the patient. -ALSO HOLD [venetoclax 400mg /day] today- No evidence of TLS. Discussed with [facility], Pharmacy.   # Gazyva infusion- G-1-2- improved post benadryl/ solumedrol will monitor closely with subsequent cycles- stable.    #History of urinary retention /prostatomegaly /prostate cancer  [Dr.Brandon]incidental-  2.1 x 0.9 cm stone or calcified mucosal lesion posterior left bladder wall in the region of the left UVJ.  Dr.Brandon.  stable.    # CKD stage II-III-   stable.    # MS clinically   stable.      # Secondary cancers: colo-[march 2024; [place]]; recommend surveillaince  with dermatology-  stable.    # Vaccination: discussed re: Flu shots; pneumonia vaccination [s/p Shigles vacciation- in past]-  stable.   # Hx of Gout Elevated uric acid 9.5-second of CLL-on allopurinol increased dose to 300 BID-  stable.    # ID prophylaxis: continue Bactrim DS; and Acyclovir.   # IV access: PIV   # DISPOSITION: # HOLD Gazyva today-  # follow up in 2  weeks- MD; labs- cbc/cmp; LDH;  mag; phos;uric acid   Gazyva-  Dr.B    All questions were answered. The patient knows to call the clinic with any problems, questions or concerns.       Earna Coder, MD 03/18/2023 9:18 AM

## 2023-03-18 NOTE — Assessment & Plan Note (Addendum)
#  CLL-CD38 positive stage III [retroperitoneal lymph nodes]-Asymptomatic. IGVH- UNMUTATED; JULY  FISH panel- 12 trisomy-   DEC 18th, 2024-  up to 17 mm- Stable lymphadenopathy involving the chest, abdomen and pelvis. enlargement of the spleen. Currently fixed duration therapyShelly Bombard- started Venatoclax with cycle #2]  # HOLD  Gazyva cycle #3 day. Labs-CBC/chemistries- ANC 0.3.  were reviewed with the patient. -ALSO HOLD [venetoclax 400mg /day] today- No evidence of TLS. Discussed with [facility], Pharmacy.   # Gazyva infusion- G-1-2- improved post benadryl/ solumedrol will monitor closely with subsequent cycles- stable.    #History of urinary retention /prostatomegaly /prostate cancer  [Dr.Brandon]incidental-  2.1 x 0.9 cm stone or calcified mucosal lesion posterior left bladder wall in the region of the left UVJ.  Dr.Brandon.  stable.    # CKD stage II-III-   stable.    # MS clinically   stable.      # Secondary cancers: colo-[march 2024; [place]]; recommend surveillaince with dermatology-  stable.    # Vaccination: discussed re: Flu shots; pneumonia vaccination [s/p Shigles vacciation- in past]- stable.   # Hx of Gout Elevated uric acid 9.5-second of CLL-on allopurinol increased dose to 300 BID-  stable.    # ID prophylaxis: continue Bactrim DS; and Acyclovir.   # IV access: PIV   # DISPOSITION: # HOLD Gazyva today-  # follow up in 2  weeks- MD; labs- cbc/cmp; LDH;  mag; phos;uric acid   Gazyva-  Dr.B

## 2023-03-18 NOTE — Progress Notes (Signed)
 Critical lab ANC called by [facility] in lab; ANC 0.3 readback. Dr Donneta Romberg notified. Appetite is good, energy is reported as good. No cold symptoms, no fevers at home. No pain. No diarrhea. Denies neuropathy.

## 2023-03-18 NOTE — Progress Notes (Signed)
 Nutrition Follow-up:  Patient with CLL.  Patient on gazyva and venetoclax.  On hold currently  Met with patient after MD visit.  Reports good/normal appetite.  Usually eats fruits and crackers for breakfast, sometimes biscuit (egg and cheese).  For lunch and supper eating vegetables and chicken mostly.  Afternoon snack of fruits, nuts.  Denies any nutrition impact symptoms.      Medications: reviewed  Labs: reviewed  Anthropometrics:   Weight 203 lb   200 lb on 2/12   NUTRITION DIAGNOSIS: none at this time   INTERVENTION:   Encouraged protein food at every meal especially breakfast.   Continue focusing on lean proteins and plant foods    MONITORING, EVALUATION, GOAL: weight trends, intake   NEXT VISIT: as needed  Ralph [name] B. Freida Busman, RD, LDN Registered Dietitian [phone]

## 2023-03-24 ENCOUNTER — Encounter: Payer: Self-pay | Admitting: Orthopedic Surgery

## 2023-03-24 ENCOUNTER — Ambulatory Visit (INDEPENDENT_AMBULATORY_CARE_PROVIDER_SITE_OTHER): Admitting: Podiatry

## 2023-03-24 ENCOUNTER — Ambulatory Visit (INDEPENDENT_AMBULATORY_CARE_PROVIDER_SITE_OTHER)

## 2023-03-24 DIAGNOSIS — M19072 Primary osteoarthritis, left ankle and foot: Secondary | ICD-10-CM

## 2023-03-24 MED ORDER — BETAMETHASONE SOD PHOS & ACET 6 (3-3) MG/ML IJ SUSP
3.0000 mg | Freq: Once | INTRAMUSCULAR | Status: AC
  Administered 2023-03-24: 3 mg via INTRA_ARTICULAR

## 2023-03-24 NOTE — Progress Notes (Signed)
   No chief complaint on file.   HPI: 58 y.o. male presenting today as a new patient for evaluation of pain and tenderness to the left ankle.  Patient states that a few weeks ago he was walking on some uneven terrain which seem to exacerbate his left ankle pain.  He went to [facility] where x-rays were taken of the foot and he says that nothing was provided for him there.  Past Medical History:  Diagnosis Date   Acute renal failure (HCC)    Anxiety    Bladder outlet obstruction    CLL (chronic lymphocytic leukemia) (HCC)    Elevated PSA    Foreign body of bladder    MS (multiple sclerosis) (HCC)    Pre-diabetes    Prostate cancer (HCC) 2013   UTI (urinary tract infection)     Past Surgical History:  Procedure Laterality Date   CYSTOSCOPY N/A 12/16/2021   Procedure: CYSTOSCOPY WITH REMOVAL OF FOREIGN BODY;  Surgeon: Vanna Scotland, MD;  Location: [facility] ORS;  Service: Urology;  Laterality: N/A;   HOLEP-LASER ENUCLEATION OF THE PROSTATE WITH MORCELLATION N/A 03/11/2021   Procedure: HOLEP-LASER ENUCLEATION OF THE PROSTATE WITH MORCELLATION;  Surgeon: Vanna Scotland, MD;  Location: [facility] ORS;  Service: Urology;  Laterality: N/A;   KNEE SURGERY  2002   meniscus   TRANSURETHRAL RESECTION OF PROSTATE N/A 12/16/2021   Procedure: TRANSURETHRAL RESECTION OF THE PROSTATE (TURP);  Surgeon: Vanna Scotland, MD;  Location: [facility] ORS;  Service: Urology;  Laterality: N/A;    Allergies  Allergen Reactions   Gazyva [Obinutuzumab] Other (See Comments)    Nausea, diaphoresis, "feeling hot" and became febrile.  Pt able to complete infusion.     Physical Exam: General: The patient is alert and oriented x3 in no acute distress.  Dermatology: Skin is warm, dry and supple bilateral lower extremities.   Vascular: Palpable pedal pulses bilaterally. Capillary refill within normal limits.  No appreciable edema.  No erythema.  Neurological: Grossly intact via light touch  Musculoskeletal Exam: No pedal  deformities noted.  Muscle strength 5/5 all compartments.  There is some tenderness to palpation to the anterolateral aspect of the left ankle  Radiographic Exam LT ankle 03/24/2023:  Normal osseous mineralization.  There is some slight incongruity of the tibiotalar joint with joint space narrowing along the lateral gutter.  Assessment/Plan of Care: 1.  Capsulitis/mild arthritis anterior lateral aspect of the left ankle  -Patient evaluated.  X-rays reviewed -Injection of 0.5 cc Celestone Soluspan injected around the anterior lateral aspect of the left ankle joint -Ankle brace dispensed. WBAT in good supportive tennis shoes -Return to clinic 4 weeks       Ralph [name], DPM [facility]  Dr. Felecia [name], DPM    [address] [place], [place] [address]                Office [phone]  Fax [phone]

## 2023-03-24 NOTE — Progress Notes (Signed)
 Office Visit Note   Patient: Ralph [name]           Date of Birth: November 06, 1965           MRN: [id number] Visit Date: 03/09/2023              Requested by: [name], MD [address] [place],  [place] [address] PCP: [name], MD  Chief Complaint  Patient presents with   Right Foot - Pain      HPI: Patient is a 58 year old gentleman who presents with acute left foot pain for 1 week.  Patient states the pain started with walking on uneven surfaces barefoot.  Patient states his symptoms are better today.  Pain primarily over the lateral side of his foot.  Patient states has had a recent flareup of his gout.  Patient is currently on allopurinol 100 mg twice a day and his most recent uric acid was 3.32 weeks ago.  Patient is currently on infusion therapy for leukemia.  Assessment & Plan: Visit Diagnoses:  1. Pain in right foot     Plan: Recommended decreasing allopurinol from twice a day to once a day.  Recommended Voltaren gel and a stiff soled sneaker to unload the joints of the base of the fourth and fifth metatarsals.  Follow-Up Instructions: Return in about 3 months (around 06/09/2023).   Ortho Exam  Patient is alert, oriented, no adenopathy, well-dressed, normal affect, normal respiratory effort. Examination patient has good pulses he has good ankle good subtalar motion.  Patient is symptomatic to palpation of the base of the fifth and fourth metatarsals.  A dorsal osteophytic bone spur is palpated at the base of the fourth and fifth metatarsals.  Imaging: DG Ankle Complete Left Result Date: 03/24/2023 Please see detailed radiograph report in office note.  No images are attached to the encounter.  Labs: Lab Results  Component Value Date   HGBA1C 6.3 (H) 01/22/2021   HGBA1C 5.8 (H) 07/11/2016   LABURIC 2.8 (L) 03/18/2023   LABURIC 3.3 (L) 02/19/2023   LABURIC 3.2 (L) 02/18/2023   REPTSTATUS 01/22/2021 FINAL 01/21/2021   CULT  01/21/2021    NO  GROWTH Performed at [facility], [address], [place], [place] [address]      Lab Results  Component Value Date   ALBUMIN 4.2 03/18/2023   ALBUMIN 4.4 02/18/2023   ALBUMIN 4.2 02/02/2023    Lab Results  Component Value Date   MG 2.0 03/18/2023   MG 2.1 02/19/2023   MG 2.1 02/18/2023   No results found for: "VD25OH"  No results found for: "PREALBUMIN"    Latest Ref Rng & Units 03/18/2023    8:06 AM 02/18/2023    8:27 AM 02/02/2023    8:47 AM  CBC EXTENDED  WBC 4.0 - 10.5 K/uL 2.0  11.1  17.3   RBC 4.22 - 5.81 MIL/uL 4.05  3.82  3.50   Hemoglobin 13.0 - 17.0 g/dL 82.9  56.2  13.0   HCT 39.0 - 52.0 % 35.7  35.7  33.8   Platelets 150 - 400 K/uL 303  239  171   NEUT# 1.7 - 7.7 K/uL 0.3  1.2  4.3   Lymph# 0.7 - 4.0 K/uL 1.2  7.6  12.0      There is no height or weight on file to calculate BMI.  Orders:  Orders Placed This Encounter  Procedures   XR Foot 2 Views Right   No orders of  the defined types were placed in this encounter.    Procedures: No procedures performed  Clinical Data: No additional findings.  ROS:  All other systems negative, except as noted in the HPI. Review of Systems  Objective: Vital Signs: There were no vitals taken for this visit.  Specialty Comments:  No specialty comments available.  PMFS History: Patient Active Problem List   Diagnosis Date Noted   Seborrheic dermatitis 11/17/2022   Vitiligo 11/17/2022   CLL (chronic lymphocytic leukemia) (HCC) 02/11/2021   Acute renal failure (HCC) 01/21/2021   Bladder outlet obstruction 01/21/2021   Leukocytosis 01/21/2021   Obstructive uropathy 01/21/2021   Pre-diabetes 07/15/2016   Hyperlipidemia 07/15/2016   Atypical lymphocytosis 07/15/2016   Elevated PSA 07/11/2016   Prostate cancer (HCC) 07/11/2016   MS (multiple sclerosis) (HCC) 07/11/2016   Screening for colon cancer 07/11/2016   Past Medical History:  Diagnosis Date   Acute renal failure (HCC)    Anxiety     Bladder outlet obstruction    CLL (chronic lymphocytic leukemia) (HCC)    Elevated PSA    Foreign body of bladder    MS (multiple sclerosis) (HCC)    Pre-diabetes    Prostate cancer (HCC) 2013   UTI (urinary tract infection)     Family History  Problem Relation Age of Onset   Cancer Sister        breast CA     Past Surgical History:  Procedure Laterality Date   CYSTOSCOPY N/A 12/16/2021   Procedure: CYSTOSCOPY WITH REMOVAL OF FOREIGN BODY;  Surgeon: Vanna Scotland, MD;  Location: [facility] ORS;  Service: Urology;  Laterality: N/A;   HOLEP-LASER ENUCLEATION OF THE PROSTATE WITH MORCELLATION N/A 03/11/2021   Procedure: HOLEP-LASER ENUCLEATION OF THE PROSTATE WITH MORCELLATION;  Surgeon: Vanna Scotland, MD;  Location: [facility] OR[facility];  Service: Urology;  Laterality: N/A;   KNEE SURGERY  2002   meniscus   TRANSURETHRAL RESECTION OF PROSTATE N/A 12/16/2021   Procedure: TRANSURETHRAL RESECTION OF THE PROSTATE (TURP);  Surgeon: Vanna Scotland, MD;  Location: [facility] ORS;  Service: Urology;  Laterality: N/A;   Social History   Occupational History   Occupation: Research officer, 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

## 2023-03-31 ENCOUNTER — Other Ambulatory Visit: Payer: Self-pay

## 2023-03-31 DIAGNOSIS — E790 Hyperuricemia without signs of inflammatory arthritis and tophaceous disease: Secondary | ICD-10-CM

## 2023-03-31 DIAGNOSIS — C9110 Chronic lymphocytic leukemia of B-cell type not having achieved remission: Secondary | ICD-10-CM

## 2023-03-31 MED FILL — Dexamethasone Sodium Phosphate Inj 100 MG/10ML: INTRAMUSCULAR | Qty: 2 | Status: AC

## 2023-04-01 ENCOUNTER — Inpatient Hospital Stay

## 2023-04-01 ENCOUNTER — Inpatient Hospital Stay: Admitting: Internal Medicine

## 2023-04-01 ENCOUNTER — Encounter: Payer: Self-pay | Admitting: Internal Medicine

## 2023-04-01 VITALS — BP 121/82 | HR 58 | Resp 18

## 2023-04-01 VITALS — BP 104/69 | HR 72 | Temp 99.0°F | Resp 16 | Wt 201.4 lb

## 2023-04-01 DIAGNOSIS — Z79899 Other long term (current) drug therapy: Secondary | ICD-10-CM | POA: Diagnosis not present

## 2023-04-01 DIAGNOSIS — C9110 Chronic lymphocytic leukemia of B-cell type not having achieved remission: Secondary | ICD-10-CM

## 2023-04-01 DIAGNOSIS — Z5112 Encounter for antineoplastic immunotherapy: Secondary | ICD-10-CM | POA: Diagnosis not present

## 2023-04-01 DIAGNOSIS — Z79624 Long term (current) use of inhibitors of nucleotide synthesis: Secondary | ICD-10-CM | POA: Diagnosis not present

## 2023-04-01 DIAGNOSIS — Z7969 Long term (current) use of other immunomodulators and immunosuppressants: Secondary | ICD-10-CM | POA: Diagnosis not present

## 2023-04-01 LAB — PHOSPHORUS: Phosphorus: 4.2 mg/dL (ref 2.5–4.6)

## 2023-04-01 LAB — CMP (CANCER CENTER ONLY)
ALT: 23 U/L (ref 0–44)
AST: 20 U/L (ref 15–41)
Albumin: 4.4 g/dL (ref 3.5–5.0)
Alkaline Phosphatase: 57 U/L (ref 38–126)
Anion gap: 11 (ref 5–15)
BUN: 16 mg/dL (ref 6–20)
CO2: 26 mmol/L (ref 22–32)
Calcium: 9.3 mg/dL (ref 8.9–10.3)
Chloride: 103 mmol/L (ref 98–111)
Creatinine: 1 mg/dL (ref 0.61–1.24)
GFR, Estimated: >60 mL/min
Glucose, Bld: 110 mg/dL — ABNORMAL HIGH (ref 70–99)
Potassium: 3.8 mmol/L (ref 3.5–5.1)
Sodium: 140 mmol/L (ref 135–145)
Total Bilirubin: 0.8 mg/dL (ref 0.0–1.2)
Total Protein: 7 g/dL (ref 6.5–8.1)

## 2023-04-01 LAB — CBC WITH DIFFERENTIAL (CANCER CENTER ONLY)
Abs Immature Granulocytes: 0.01 10*3/uL (ref 0.00–0.07)
Basophils Absolute: 0 10*3/uL (ref 0.0–0.1)
Basophils Relative: 1 %
Eosinophils Absolute: 0 10*3/uL (ref 0.0–0.5)
Eosinophils Relative: 1 %
HCT: 39.1 % (ref 39.0–52.0)
Hemoglobin: 11.8 g/dL — ABNORMAL LOW (ref 13.0–17.0)
Immature Granulocytes: 0 %
Lymphocytes Relative: 56 %
Lymphs Abs: 2.1 10*3/uL (ref 0.7–4.0)
MCH: 25.7 pg — ABNORMAL LOW (ref 26.0–34.0)
MCHC: 30.2 g/dL (ref 30.0–36.0)
MCV: 85.2 fL (ref 80.0–100.0)
Monocytes Absolute: 0.4 10*3/uL (ref 0.1–1.0)
Monocytes Relative: 11 %
Neutro Abs: 1.2 10*3/uL — ABNORMAL LOW (ref 1.7–7.7)
Neutrophils Relative %: 31 %
Platelet Count: 269 10*3/uL (ref 150–400)
RBC: 4.59 MIL/uL (ref 4.22–5.81)
RDW: 15.1 % (ref 11.5–15.5)
WBC Count: 3.8 10*3/uL — ABNORMAL LOW (ref 4.0–10.5)
nRBC: 0 % (ref 0.0–0.2)

## 2023-04-01 LAB — LACTATE DEHYDROGENASE: LDH: 139 U/L (ref 98–192)

## 2023-04-01 LAB — MAGNESIUM: Magnesium: 2 mg/dL (ref 1.7–2.4)

## 2023-04-01 LAB — URIC ACID: Uric Acid, Serum: 2.1 mg/dL — ABNORMAL LOW (ref 3.7–8.6)

## 2023-04-01 MED ORDER — ACETAMINOPHEN 325 MG PO TABS
650.0000 mg | ORAL_TABLET | Freq: Once | ORAL | Status: AC
  Administered 2023-04-01: 650 mg via ORAL
  Filled 2023-04-01: qty 2

## 2023-04-01 MED ORDER — DIPHENHYDRAMINE HCL 50 MG/ML IJ SOLN
50.0000 mg | Freq: Once | INTRAMUSCULAR | Status: AC
  Administered 2023-04-01: 50 mg via INTRAVENOUS
  Filled 2023-04-01: qty 1

## 2023-04-01 MED ORDER — MONTELUKAST SODIUM 10 MG PO TABS
10.0000 mg | ORAL_TABLET | Freq: Once | ORAL | Status: AC
  Administered 2023-04-01: 10 mg via ORAL
  Filled 2023-04-01: qty 1

## 2023-04-01 MED ORDER — FAMOTIDINE IN NACL 20-0.9 MG/50ML-% IV SOLN
20.0000 mg | Freq: Once | INTRAVENOUS | Status: AC
  Administered 2023-04-01: 20 mg via INTRAVENOUS
  Filled 2023-04-01: qty 50

## 2023-04-01 MED ORDER — SODIUM CHLORIDE 0.9 % IV SOLN
INTRAVENOUS | Status: DC
Start: 2023-04-01 — End: 2023-04-01
  Filled 2023-04-01 (×2): qty 250

## 2023-04-01 MED ORDER — SODIUM CHLORIDE 0.9 % IV SOLN
20.0000 mg | Freq: Once | INTRAVENOUS | Status: AC
  Administered 2023-04-01: 20 mg via INTRAVENOUS
  Filled 2023-04-01: qty 2
  Filled 2023-04-01: qty 20

## 2023-04-01 MED ORDER — SODIUM CHLORIDE 0.9 % IV SOLN
1000.0000 mg | Freq: Once | INTRAVENOUS | Status: AC
  Administered 2023-04-01: 1000 mg via INTRAVENOUS
  Filled 2023-04-01: qty 40

## 2023-04-01 NOTE — Progress Notes (Signed)
 [facility] CONSULT NOTE  Patient Care Team: [name], MD as PCP - General (Family Medicine) Earna Coder, MD as Consulting Physician (Oncology)  CHIEF COMPLAINTS/PURPOSE OF CONSULTATION: CLL  Oncology History Overview Note  Chronic lymphocytic leukemia (CLL), positive for CD20, CD22, CD19 and  CD38,  see comment.   ANNOTATION COMMENT IMP Comment VC   Comment: (NOTE)  Expression of CD38 on >30% of the clonal B-cells is reported to be an  unfavorable prognostic factor in CLL.     # CLL stage I- [lymphocytosis/retroperitoneal lymphadenopathy up to 2.5 cm-incidental CT protocol- 2023];   APRIL 2024- consistent with trisomy 12. Results for  CCND1/IGH, ATM, 13q and TP53 were normal.   # JAN 14th, 2024- Gazyva+ plan to add Venatoclax with cycle 2 or 3.   #GEN 2023-bilateral severe hydronephrosis-bladder outlet obstruction/status post catheter [Dr.Brandon]  #History of prostate cancer under surveillance [[facility]]   CLL (chronic lymphocytic leukemia) (HCC)  02/11/2021 Initial Diagnosis   CLL (chronic lymphocytic leukemia) (HCC)   01/20/2023 -  Chemotherapy   Patient is on Treatment Plan : LYMPHOMA CLL/SLL Venetoclax + Obinutuzumab q28d      HISTORY OF PRESENTING ILLNESS: Alone.  Ambulating independently.  Ralph [name] 58 y.o.  male history of multiple sclerosis-not on active therapy; prostate cancer on surveillance; CLL currently on [place] [ started Venatoclax with cycle #2] is here for a follow-up.  Patient states that hi weight down 2 lbs today. Appetite is reported as good. Denies nausea. Bowels normal. Energy is suprisingly better. He says he feels so much better now that he is in treatment than he did before. Venetoclax is on hold    No cold symptoms, no fevers at home. No pain. No diarrhea. Denies neuropathy.   Gained weight.  Denies any lumps or bumps.    Review of Systems  Constitutional:  Positive for weight loss. Negative for chills,  diaphoresis, fever and malaise/fatigue.  HENT:  Negative for nosebleeds and sore throat.   Eyes:  Negative for double vision.  Respiratory:  Negative for cough, hemoptysis, sputum production, shortness of breath and wheezing.   Cardiovascular:  Negative for chest pain, palpitations, orthopnea and leg swelling.  Gastrointestinal:  Negative for abdominal pain, blood in stool, constipation, diarrhea, heartburn, melena, nausea and vomiting.  Genitourinary:  Negative for dysuria, frequency and urgency.  Musculoskeletal:  Negative for back pain and joint pain.  Skin: Negative.  Negative for itching and rash.  Neurological:  Negative for dizziness, tingling, focal weakness, weakness and headaches.  Endo/Heme/Allergies:  Does not bruise/bleed easily.  Psychiatric/Behavioral:  Negative for depression. The patient is not nervous/anxious and does not have insomnia.      MEDICAL HISTORY:  Past Medical History:  Diagnosis Date   Acute renal failure (HCC)    Anxiety    Bladder outlet obstruction    CLL (chronic lymphocytic leukemia) (HCC)    Elevated PSA    Foreign body of bladder    MS (multiple sclerosis) (HCC)    Pre-diabetes    Prostate cancer (HCC) 2013   UTI (urinary tract infection)     SURGICAL HISTORY: Past Surgical History:  Procedure Laterality Date   CYSTOSCOPY N/A 12/16/2021   Procedure: CYSTOSCOPY WITH REMOVAL OF FOREIGN BODY;  Surgeon: Vanna Scotland, MD;  Location: [facility] ORS;  Service: Urology;  Laterality: N/A;   HOLEP-LASER ENUCLEATION OF THE PROSTATE WITH MORCELLATION N/A 03/11/2021   Procedure: HOLEP-LASER ENUCLEATION OF THE PROSTATE WITH MORCELLATION;  Surgeon: Vanna Scotland, MD;  Location:  [facility] ORS;  Service: Urology;  Laterality: N/A;   KNEE SURGERY  2002   meniscus   TRANSURETHRAL RESECTION OF PROSTATE N/A 12/16/2021   Procedure: TRANSURETHRAL RESECTION OF THE PROSTATE (TURP);  Surgeon: Vanna Scotland, MD;  Location: [facility] ORS;  Service: Urology;  Laterality: N/A;     SOCIAL HISTORY: Social History   Socioeconomic History   Marital status: Single    Spouse name: Not on file   Number of children: 0   Years of education: College   Highest education level: Not on file  Occupational History   Occupation: Research officer, 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  Denies any alcohol; lives in [place].    Social Drivers of Health   Financial Resource Strain: Low Risk  (08/27/2022)   Received from [facility]   Overall Financial Resource Strain (CARDIA)    Difficulty of Paying Living Expenses: Not hard at all  Food Insecurity: No Food Insecurity (08/27/2022)   Received from [facility]   Hunger Vital Sign    Worried About Running Out of Food in the Last Year: Never true    Ran Out of Food in the Last Year: Never true  Transportation Needs: No Transportation Needs (08/27/2022)   Received from [facility] - Transportation    In the past 12 months, has lack of transportation kept you from medical appointments or from getting medications?: No    Lack of Transportation (Non-Medical): No  Physical Activity: Not on file  Stress: Not on file  Social Connections: Not on file  Intimate Partner Violence: Not on file    FAMILY HISTORY: Family History  Problem Relation Age of Onset   Cancer Sister        breast CA     ALLERGIES:  is allergic to gazyva [obinutuzumab].  MEDICATIONS:  Current Outpatient Medications  Medication Sig Dispense Refill   acyclovir (ZOVIRAX) 400 MG tablet  Take 1 tablet (400 mg total) by mouth 2 (two) times daily. 60 tablet 4   allopurinol (ZYLOPRIM) 300 MG tablet Take 1 tablet (300 mg total) by mouth 2 (two) times daily. 60 tablet 4   Multiple Vitamin (MULTIVITAMIN) tablet Take 1 tablet by mouth daily.     rosuvastatin (CRESTOR) 5 MG tablet Take 1 tablet by mouth at bedtime.     sulfamethoxazole-trimethoprim (BACTRIM DS) 800-160 MG tablet One pill on Monday;wed;Friday 30 tablet 1   ondansetron (ZOFRAN) 8 MG tablet One pill every 8 hours as needed for nausea/vomitting. (Patient not taking: Reported on 04/01/2023) 40 tablet 1   prochlorperazine (COMPAZINE) 10 MG tablet Take 1 tablet (10 mg total) by mouth every 6 (six) hours as needed for nausea or vomiting. (Patient not taking: Reported on 04/01/2023) 40 tablet 1   venetoclax (VENCLEXTA) 100 MG tablet Take 4 tablets (400 mg total) by mouth daily. Tablets should be swallowed whole with a meal and a full glass of water. (Patient not taking: Reported  on 04/01/2023) 120 tablet 0   No current facility-administered medications for this visit.    PHYSICAL EXAMINATION:  Vitals:   04/01/23 0836  BP: 104/69  Pulse: 72  Resp: 16  Temp: 99 F (37.2 C)  SpO2: 100%     Filed Weights   04/01/23 0836  Weight: 201 lb 6.4 oz (91.4 kg)   At Baseline- Shotty bilateral neck adenopathy.  1 to 2 cm lymph node bilateral axilla left more than right.  Mild lymphadenopathy in the right inguinal region  3/12- resolution of lymphadenopathy  Physical Exam Vitals and nursing note reviewed.  HENT:     Head: Normocephalic and atraumatic.     Mouth/Throat:     Pharynx: Oropharynx is clear.  Eyes:     Extraocular Movements: Extraocular movements intact.     Pupils: Pupils are equal, round, and reactive to light.  Cardiovascular:     Rate and Rhythm: Normal rate and regular rhythm.  Pulmonary:     Comments: Decreased breath sounds bilaterally.  Abdominal:     Palpations: Abdomen is soft.  Musculoskeletal:         General: Normal range of motion.     Cervical back: Normal range of motion.  Skin:    General: Skin is warm.  Neurological:     General: No focal deficit present.     Mental Status: He is alert and oriented to person, place, and time.  Psychiatric:        Behavior: Behavior normal.        Judgment: Judgment normal.     LABORATORY DATA:  I have reviewed the data as listed Lab Results  Component Value Date   WBC 3.8 (L) 04/01/2023   HGB 11.8 (L) 04/01/2023   HCT 39.1 04/01/2023   MCV 85.2 04/01/2023   PLT 269 04/01/2023   Recent Labs    02/18/23 0827 02/19/23 0915 03/18/23 0805 04/01/23 0816  NA 140 140 140 140  K 4.4 4.2 4.0 3.8  CL 105 107 104 103  CO2 28 25 28 26   GLUCOSE 107* 115* 107* 110*  BUN 16 22* 14 16  CREATININE 0.97 1.05 0.89 1.00  CALCIUM 9.4 9.4 9.1 9.3  GFRNONAA >60 >60 >60 >60  PROT 6.9  --  6.5 7.0  ALBUMIN 4.4  --  4.2 4.4  AST 23  --  18 20  ALT 22  --  14 23  ALKPHOS 54  --  50 57  BILITOT 0.9  --  0.8 0.8    RADIOGRAPHIC STUDIES: I have personally reviewed the radiological images as listed and agreed with the findings in the report. XR Foot 2 Views Right Result Date: 03/24/2023 2 view radiographs of the right foot shows no fractures no joint space narrowing.  DG Ankle Complete Left Result Date: 03/24/2023 Please see detailed radiograph report in office note.    CLL (chronic lymphocytic leukemia) (HCC) #CLL-CD38 positive stage III [retroperitoneal lymph nodes]-Asymptomatic. IGVH- UNMUTATED; JULY  FISH panel- 12 trisomy-   DEC 18th, 2024-  up to 17 mm- Stable lymphadenopathy involving the chest, abdomen and pelvis. enlargement of the spleen. Currently fixed duration therapy- Gazyva- started Venatoclax with cycle #2]  # Proceed with Gazyva cycle #3 day. Labs-CBC/chemistries- ANC 1.2 were reviewed with the patient. -ALSO  continue [venetoclax 400mg /day] today- No evidence of TLS.  Will plan imaging after 6 cycles of therapy.   # Gazyva  infusion- G-1-2- improved post benadryl/ solumedrol will monitor closely with subsequent cycles- stable.    #  History of urinary retention /prostatomegaly /prostate cancer - on surveiilaince [Dr.Brandon]incidental-  2.1 x 0.9 cm stone or calcified mucosal lesion posterior left bladder wall in the region of the left UVJ.  Dr.Brandon.  stable.    # CKD stage II-III-  stable.      # MS clinically   stable.        # Secondary cancers: colo-[march 2024; [place]]; recommend surveillaince with dermatology-  stable.    # Vaccination: discussed re: Flu shots; pneumonia vaccination [s/p Shigles vacciation- in past]- stable.   # Hx of Gout Elevated uric acid 9.5-second of CLL-on allopurinol increased dose to 300 BID-  stable.    # ID prophylaxis: continue Bactrim DS; and Acyclovir.   # IV access: PIV   PS # DISPOSITION: #  proceed with Gazyva today-  # follow up in 4 weeks- MD; labs- cbc/cmp; LDH;  mag; phos;uric acid   Gazyva-  Dr.B    All questions were answered. The patient knows to call the clinic with any problems, questions or concerns.       Earna Coder, MD 04/01/2023 9:22 AM

## 2023-04-01 NOTE — Assessment & Plan Note (Addendum)
#  CLL-CD38 positive stage III [retroperitoneal lymph nodes]-Asymptomatic. IGVH- UNMUTATED; JULY  FISH panel- 12 trisomy-   DEC 18th, 2024-  up to 17 mm- Stable lymphadenopathy involving the chest, abdomen and pelvis. enlargement of the spleen. Currently fixed duration therapy- Gazyva- started Venatoclax with cycle #2]  # Proceed with Gazyva cycle #3 day. Labs-CBC/chemistries- ANC 1.2 were reviewed with the patient. -ALSO  continue [venetoclax 400mg /day] today- No evidence of TLS.  Will plan imaging after 6 cycles of therapy.   # Gazyva infusion- G-1-2- improved post benadryl/ solumedrol will monitor closely with subsequent cycles- stable.    #History of urinary retention /prostatomegaly /prostate cancer - on surveiilaince [Dr.Brandon]incidental-  2.1 x 0.9 cm stone or calcified mucosal lesion posterior left bladder wall in the region of the left UVJ.  Dr.Brandon.  stable.    # CKD stage II-III-  stable.      # MS clinically   stable.        # Secondary cancers: colo-[march 2024; [place]]; recommend surveillaince with dermatology-  stable.    # Vaccination: discussed re: Flu shots; pneumonia vaccination [s/p Shigles vacciation- in past]- stable.   # Hx of Gout Elevated uric acid 9.5-second of CLL-on allopurinol increased dose to 300 BID-  stable.    # ID prophylaxis: continue Bactrim DS; and Acyclovir.   # IV access: PIV   PS # DISPOSITION: #  proceed with Gazyva today-  # follow up in 4 weeks- MD; labs- cbc/cmp; LDH;  mag; phos;uric acid   Gazyva-  Dr.B

## 2023-04-01 NOTE — Progress Notes (Signed)
 Down 2 lbs today. Appetite is reported as good. Denies nausea. Bowels normal. Energy is suprisingly better. He says he feels so much better now that he is in treatment than he did before. Venetoclax is on hold.

## 2023-04-01 NOTE — Patient Instructions (Signed)
 [facility] - A DEPT OF [facility]  Discharge Instructions: Thank you for choosing [facility] to provide your oncology and hematology care.  If you have a lab appointment with the Cancer Center, please go directly to the Cancer Center and check in at the registration area.  Wear comfortable clothing and clothing appropriate for easy access to any Portacath or PICC line.   We strive to give you quality time with your provider. You may need to reschedule your appointment if you arrive late (15 or more minutes).  Arriving late affects you and other patients whose appointments are after yours.  Also, if you miss three or more appointments without notifying the office, you may be dismissed from the clinic at the provider's discretion.      For prescription refill requests, have your pharmacy contact our office and allow 72 hours for refills to be completed.    Today you received the following chemotherapy and/or immunotherapy agents GAZYVA      To help prevent nausea and vomiting after your treatment, we encourage you to take your nausea medication as directed.  BELOW ARE SYMPTOMS THAT SHOULD BE REPORTED IMMEDIATELY: *FEVER GREATER THAN 100.4 F (38 C) OR HIGHER *CHILLS OR SWEATING *NAUSEA AND VOMITING THAT IS NOT CONTROLLED WITH YOUR NAUSEA MEDICATION *UNUSUAL SHORTNESS OF BREATH *UNUSUAL BRUISING OR BLEEDING *URINARY PROBLEMS (pain or burning when urinating, or frequent urination) *BOWEL PROBLEMS (unusual diarrhea, constipation, pain near the anus) TENDERNESS IN MOUTH AND THROAT WITH OR WITHOUT PRESENCE OF ULCERS (sore throat, sores in mouth, or a toothache) UNUSUAL RASH, SWELLING OR PAIN  UNUSUAL VAGINAL DISCHARGE OR ITCHING   Items with * indicate a potential emergency and should be followed up as soon as possible or go to the Emergency Department if any problems should occur.  Please show the CHEMOTHERAPY ALERT CARD or IMMUNOTHERAPY ALERT  CARD at check-in to the Emergency Department and triage nurse.  Should you have questions after your visit or need to cancel or reschedule your appointment, please contact [facility] - A DEPT OF [facility]  [phone] and follow the prompts.  Office hours are 8:00 a.m. to 4:30 p.m. Monday - Friday. Please note that voicemails left after 4:00 p.m. may not be returned until the following business day.  We are closed weekends and major holidays. You have access to a nurse at all times for urgent questions. Please call the main number to the clinic [phone] and follow the prompts.  For any non-urgent questions, you may also contact your provider using MyChart. We now offer e-Visits for anyone 79 and older to request care online for non-urgent symptoms. For details visit [email or web address].   Also download the MyChart app! Go to the app store, search "MyChart", open the app, select [place], and log in with your MyChart username and password.  Obinutuzumab Injection What is this medication? OBINUTUZUMAB (OH bi nue TOOZ ue mab) treats leukemia and lymphoma. It works by blocking a protein that causes cancer cells to grow and multiply. This helps to slow or stop the spread of cancer cells. It is a monoclonal antibody. This medicine may be used for other purposes; ask your health care provider or pharmacist if you have questions. COMMON BRAND NAME(S): GAZYVA What should I tell my care team before I take this medication? They need to know if you have any of these conditions: Heart disease Infection, especially a  viral infection, such as hepatitis B Lung or breathing disease Take medications that treat or prevent blood clots An unusual or allergic reaction to obinutuzumab, other medications, foods, dyes, or preservatives Pregnant or trying to get pregnant Breastfeeding How should I use this medication? This medication is for infusion into a vein. It is  given by a care team in a hospital or clinic setting. Talk to your care team about the use of this medication in children. Special care may be needed. Overdosage: If you think you have taken too much of this medicine contact a poison control center or emergency room at once. NOTE: This medicine is only for you. Do not share this medicine with others. What if I miss a dose? Keep appointments for follow-up doses as directed. It is important not to miss your dose. Call your care team if you are unable to keep an appointment. What may interact with this medication? Live virus vaccines This list may not describe all possible interactions. Give your health care provider a list of all the medicines, herbs, non-prescription drugs, or dietary supplements you use. Also tell them if you smoke, drink alcohol, or use illegal drugs. Some items may interact with your medicine. What should I watch for while using this medication? Report any side effects that you notice during your treatment right away, such as changes in your breathing, fever, chills, dizziness or lightheadedness. These effects are more common with the first dose. Visit your care team for checks on your progress. You will need to have regular blood work. Report any other side effects. The side effects of this medication can continue after you finish your treatment. Continue your course of treatment even though you feel ill unless your care team tells you to stop. Call your care team for advice if you get a fever, chills or sore throat, or other symptoms of a cold or flu. Do not treat yourself. This medication decreases your body's ability to fight infections. Try to avoid being around people who are sick. This medication may increase your risk to bruise or bleed. Call your care team if you notice any unusual bleeding. Do not become pregnant while taking this medication or for 6 months after stopping it. Inform your care team if you wish to become  pregnant or think you might be pregnant. There is a potential for serious side effects to an unborn child. Talk to your care team or pharmacist for more information. Do not breast-feed an infant while taking this medication or for 6 months after stopping it. What side effects may I notice from receiving this medication? Side effects that you should report to your care team as soon as possible: Allergic reactions--skin rash, itching, hives, swelling of the face, lips, tongue, or throat Bleeding--bloody or black, tar-like stools, vomiting blood or brown material that looks like coffee grounds, red or dark brown urine, small red or purple spots on skin, unusual bruising or bleeding Blood clot--pain, swelling, or warmth in the leg, shortness of breath, chest pain Dizziness, loss of balance or coordination, confusion or trouble speaking Infection--fever, chills, cough, sore throat, wounds that don't heal, pain or trouble when passing urine, general feeling of discomfort or being unwell Infusion reactions--chest pain, shortness of breath or trouble breathing, feeling faint or lightheaded Liver injury--right upper belly pain, loss of appetite, nausea, light-colored stool, dark yellow or brown urine, yellowing skin or eyes, unusual weakness or fatigue Tumor lysis syndrome (TLS)--nausea, vomiting, diarrhea, decrease in the amount of urine, dark  urine, unusual weakness or fatigue, confusion, muscle pain or cramps, fast or irregular heartbeat, joint pain Side effects that usually do not require medical attention (report to your care team if they continue or are bothersome): Bone, joint, or muscle pain Constipation Diarrhea Fatigue Runny or stuffy nose Sore throat This list may not describe all possible side effects. Call your doctor for medical advice about side effects. You may report side effects to FDA at 1-800-FDA-1088. Where should I keep my medication? This medication is only given in a hospital or  clinic and will not be stored at home. NOTE: This sheet is a summary. It may not cover all possible information. If you have questions about this medicine, talk to your doctor, pharmacist, or health care provider.  2024 Elsevier/Gold Standard (2021-05-15 00:00:00)

## 2023-04-06 ENCOUNTER — Other Ambulatory Visit: Payer: Self-pay

## 2023-04-06 ENCOUNTER — Other Ambulatory Visit (HOSPITAL_COMMUNITY): Payer: Self-pay

## 2023-04-10 ENCOUNTER — Telehealth: Payer: Self-pay | Admitting: *Deleted

## 2023-04-10 ENCOUNTER — Encounter: Payer: Self-pay | Admitting: *Deleted

## 2023-04-10 NOTE — Telephone Encounter (Signed)
 Called him and told him he can pick it up today. He will come around 3:45

## 2023-04-27 ENCOUNTER — Other Ambulatory Visit: Payer: Self-pay

## 2023-04-27 ENCOUNTER — Other Ambulatory Visit: Payer: Self-pay | Admitting: Internal Medicine

## 2023-04-27 DIAGNOSIS — C9110 Chronic lymphocytic leukemia of B-cell type not having achieved remission: Secondary | ICD-10-CM

## 2023-04-27 MED ORDER — VENETOCLAX 100 MG PO TABS
400.0000 mg | ORAL_TABLET | Freq: Every day | ORAL | 0 refills | Status: DC
  Filled 2023-04-27: qty 120, 30d supply, fill #0

## 2023-04-27 NOTE — Progress Notes (Signed)
 Specialty Pharmacy Refill Coordination Note  Ralph [name] is a 58 y.o. male contacted today regarding refills of specialty medication(s) Venetoclax  (VENCLEXTA )   Patient requested Delivery   Delivery date: 04/29/23   Verified address: [address]   [place] [id number]   Medication will be filled on 04/28/23.     This fill date is pending response to refill request from provider. Patient is aware and if they have not received fill by intended date they must follow up with pharmacy.

## 2023-04-28 ENCOUNTER — Other Ambulatory Visit: Payer: Self-pay

## 2023-04-28 ENCOUNTER — Encounter: Payer: Self-pay | Admitting: Podiatry

## 2023-04-28 ENCOUNTER — Ambulatory Visit: Admitting: Podiatry

## 2023-04-28 DIAGNOSIS — M19072 Primary osteoarthritis, left ankle and foot: Secondary | ICD-10-CM | POA: Diagnosis not present

## 2023-04-28 MED FILL — Dexamethasone Sodium Phosphate Inj 100 MG/10ML: INTRAMUSCULAR | Qty: 2 | Status: AC

## 2023-04-28 NOTE — Progress Notes (Signed)
   Chief Complaint  Patient presents with   Arthritis    "It's doing good."    HPI: 58 y.o. male presenting today for follow-up evaluation of pain and tenderness to the left ankle.  Patient states that he no longer has any pain or tenderness associated to the ankle.  He also has noticed improved range of motion since last visit.  Brief history: Patient was walking on some uneven terrain which seem to exacerbate his left ankle pain.  [facility] where x-rays were taken of the foot and he says that nothing was provided for him there.  Past Medical History:  Diagnosis Date   Acute renal failure (HCC)    Anxiety    Bladder outlet obstruction    CLL (chronic lymphocytic leukemia) (HCC)    Elevated PSA    Foreign body of bladder    MS (multiple sclerosis) (HCC)    Pre-diabetes    Prostate cancer (HCC) 2013   UTI (urinary tract infection)     Past Surgical History:  Procedure Laterality Date   CYSTOSCOPY N/A 12/16/2021   Procedure: CYSTOSCOPY WITH REMOVAL OF FOREIGN BODY;  Surgeon: Dustin Gimenez, MD;  Location: [facility];  Service: Urology;  Laterality: N/A;   HOLEP-LASER ENUCLEATION OF THE PROSTATE WITH MORCELLATION N/A 03/11/2021   Procedure: HOLEP-LASER ENUCLEATION OF THE PROSTATE WITH MORCELLATION;  Surgeon: Dustin Gimenez, MD;  Location: [facility];  Service: Urology;  Laterality: N/A;   KNEE SURGERY  2002   meniscus   TRANSURETHRAL RESECTION OF PROSTATE N/A 12/16/2021   Procedure: TRANSURETHRAL RESECTION OF THE PROSTATE (TURP);  Surgeon: Dustin Gimenez, MD;  Location: [facility];  Service: Urology;  Laterality: N/A;    Allergies  Allergen Reactions   Gazyva  [Obinutuzumab ] Other (See Comments)    Nausea, diaphoresis, "feeling hot" and became febrile.  Pt able to complete infusion.     Physical Exam: General: The patient is alert and oriented x3 in no acute distress.  Dermatology: Skin is warm, dry and supple bilateral lower extremities.   Vascular: Palpable pedal  pulses bilaterally. Capillary refill within normal limits.  No appreciable edema.  No erythema.  Neurological: Grossly intact via light touch  Musculoskeletal Exam: No pedal deformities noted.  Muscle strength 5/5 all compartments.  No tenderness to palpation to the anterolateral aspect of the left ankle  Radiographic Exam LT ankle 03/24/2023:  Normal osseous mineralization.  There is some slight incongruity of the tibiotalar joint with joint space narrowing along the lateral gutter.  Range of motion WNL  Assessment/Plan of Care: 1.  Capsulitis/mild arthritis anterior lateral aspect of the left ankle; resolved  -Patient evaluated.  -Continue good supportive tennis shoes and sneakers -Full activity no restrictions -Recommend the ankle brace as needed -Return to clinic as needed   Dot Gazella, DPM [facility]  Dr. Dot Gazella, DPM    [address] [place], [place] [address]                Office [phone]  Fax [phone]

## 2023-04-29 ENCOUNTER — Inpatient Hospital Stay

## 2023-04-29 ENCOUNTER — Encounter: Payer: Self-pay | Admitting: Internal Medicine

## 2023-04-29 ENCOUNTER — Encounter: Payer: Self-pay | Admitting: *Deleted

## 2023-04-29 ENCOUNTER — Inpatient Hospital Stay: Attending: Internal Medicine

## 2023-04-29 ENCOUNTER — Inpatient Hospital Stay: Admitting: Internal Medicine

## 2023-04-29 VITALS — BP 119/78 | HR 72 | Temp 98.5°F | Resp 16 | Ht 72.0 in | Wt 208.4 lb

## 2023-04-29 DIAGNOSIS — Z9079 Acquired absence of other genital organ(s): Secondary | ICD-10-CM | POA: Insufficient documentation

## 2023-04-29 DIAGNOSIS — Z8546 Personal history of malignant neoplasm of prostate: Secondary | ICD-10-CM | POA: Insufficient documentation

## 2023-04-29 DIAGNOSIS — Z7969 Long term (current) use of other immunomodulators and immunosuppressants: Secondary | ICD-10-CM | POA: Diagnosis not present

## 2023-04-29 DIAGNOSIS — C9110 Chronic lymphocytic leukemia of B-cell type not having achieved remission: Secondary | ICD-10-CM | POA: Insufficient documentation

## 2023-04-29 DIAGNOSIS — Z79624 Long term (current) use of inhibitors of nucleotide synthesis: Secondary | ICD-10-CM | POA: Diagnosis not present

## 2023-04-29 DIAGNOSIS — Z79899 Other long term (current) drug therapy: Secondary | ICD-10-CM | POA: Diagnosis not present

## 2023-04-29 LAB — CBC WITH DIFFERENTIAL (CANCER CENTER ONLY)
Abs Immature Granulocytes: 0.01 10*3/uL (ref 0.00–0.07)
Basophils Absolute: 0 10*3/uL (ref 0.0–0.1)
Basophils Relative: 0 %
Eosinophils Absolute: 0 10*3/uL (ref 0.0–0.5)
Eosinophils Relative: 0 %
HCT: 39.5 % (ref 39.0–52.0)
Hemoglobin: 11.9 g/dL — ABNORMAL LOW (ref 13.0–17.0)
Immature Granulocytes: 1 %
Lymphocytes Relative: 51 %
Lymphs Abs: 1.1 10*3/uL (ref 0.7–4.0)
MCH: 24.6 pg — ABNORMAL LOW (ref 26.0–34.0)
MCHC: 30.1 g/dL (ref 30.0–36.0)
MCV: 81.6 fL (ref 80.0–100.0)
Monocytes Absolute: 0.2 10*3/uL (ref 0.1–1.0)
Monocytes Relative: 10 %
Neutro Abs: 0.8 10*3/uL — ABNORMAL LOW (ref 1.7–7.7)
Neutrophils Relative %: 38 %
Platelet Count: 325 10*3/uL (ref 150–400)
RBC: 4.84 MIL/uL (ref 4.22–5.81)
RDW: 15.4 % (ref 11.5–15.5)
WBC Count: 2.1 10*3/uL — ABNORMAL LOW (ref 4.0–10.5)
nRBC: 0 % (ref 0.0–0.2)

## 2023-04-29 LAB — CMP (CANCER CENTER ONLY)
ALT: 14 U/L (ref 0–44)
AST: 17 U/L (ref 15–41)
Albumin: 4.2 g/dL (ref 3.5–5.0)
Alkaline Phosphatase: 69 U/L (ref 38–126)
Anion gap: 9 (ref 5–15)
BUN: 19 mg/dL (ref 6–20)
CO2: 26 mmol/L (ref 22–32)
Calcium: 9.3 mg/dL (ref 8.9–10.3)
Chloride: 105 mmol/L (ref 98–111)
Creatinine: 0.87 mg/dL (ref 0.61–1.24)
GFR, Estimated: >60 mL/min (ref >60)
Glucose, Bld: 104 mg/dL — ABNORMAL HIGH (ref 70–99)
Potassium: 4 mmol/L (ref 3.5–5.1)
Sodium: 140 mmol/L (ref 135–145)
Total Bilirubin: 0.7 mg/dL (ref 0.0–1.2)
Total Protein: 7.2 g/dL (ref 6.5–8.1)

## 2023-04-29 LAB — PHOSPHORUS: Phosphorus: 4.4 mg/dL (ref 2.5–4.6)

## 2023-04-29 LAB — MAGNESIUM: Magnesium: 2 mg/dL (ref 1.7–2.4)

## 2023-04-29 LAB — URIC ACID: Uric Acid, Serum: 2.5 mg/dL — ABNORMAL LOW (ref 3.7–8.6)

## 2023-04-29 LAB — LACTATE DEHYDROGENASE: LDH: 123 U/L (ref 98–192)

## 2023-04-29 NOTE — Progress Notes (Signed)
 [facility] CONSULT NOTE  Patient Care Team: Rory Collard, MD as PCP - General (Family Medicine) Gwyn Leos, MD as Consulting Physician (Oncology)  CHIEF COMPLAINTS/PURPOSE OF CONSULTATION: CLL  Oncology History Overview Note  Chronic lymphocytic leukemia (CLL), positive for CD20, CD22, CD19 and  CD38,  see comment.   ANNOTATION COMMENT IMP Comment VC   Comment: (NOTE)  Expression of CD38 on >30% of the clonal B-cells is reported to be an  unfavorable prognostic factor in CLL.     # CLL stage I- [lymphocytosis/retroperitoneal lymphadenopathy up to 2.5 cm-incidental CT protocol- 2023];   APRIL 2024- consistent with trisomy 12. Results for  CCND1/IGH, ATM, 13q and TP53 were normal.   # JAN 14th, 2024- Gazyva + plan to add Venatoclax with cycle 2 or 3.   #GEN 2023-bilateral severe hydronephrosis-bladder outlet obstruction/status post catheter [Dr.Brandon]  #History of prostate cancer under surveillance [[facility]]   CLL (chronic lymphocytic leukemia) (HCC)  02/11/2021 Initial Diagnosis   CLL (chronic lymphocytic leukemia) (HCC)   01/20/2023 -  Chemotherapy   Patient is on Treatment Plan : LYMPHOMA CLL/SLL Venetoclax  + Obinutuzumab  q28d      HISTORY OF PRESENTING ILLNESS: Alone.  Ambulating independently.  Ralph [name] 58 y.o.  male history of multiple sclerosis-not on active therapy; prostate cancer on surveillance; CLL currently on Gazyva  [ started Venatoclax with cycle #2] is here for a follow-up.  Patient is doing well.  Appetite is reported as good.  Gained weight. Denies any lumps or bumps.   Denies nausea. Bowels normal. No fevers at home. No pain. No diarrhea. Denies neuropathy.     Review of Systems  Constitutional:  Negative for chills, diaphoresis, fever and malaise/fatigue.  HENT:  Negative for nosebleeds and sore throat.   Eyes:  Negative for double vision.  Respiratory:  Negative for cough, hemoptysis, sputum production, shortness  of breath and wheezing.   Cardiovascular:  Negative for chest pain, palpitations, orthopnea and leg swelling.  Gastrointestinal:  Negative for abdominal pain, blood in stool, constipation, diarrhea, heartburn, melena, nausea and vomiting.  Genitourinary:  Negative for dysuria, frequency and urgency.  Musculoskeletal:  Negative for back pain and joint pain.  Skin: Negative.  Negative for itching and rash.  Neurological:  Negative for dizziness, tingling, focal weakness, weakness and headaches.  Endo/Heme/Allergies:  Does not bruise/bleed easily.  Psychiatric/Behavioral:  Negative for depression. The patient is not nervous/anxious and does not have insomnia.      MEDICAL HISTORY:  Past Medical History:  Diagnosis Date   Acute renal failure (HCC)    Anxiety    Bladder outlet obstruction    CLL (chronic lymphocytic leukemia) (HCC)    Elevated PSA    Foreign body of bladder    MS (multiple sclerosis) (HCC)    Pre-diabetes    Prostate cancer (HCC) 2013   UTI (urinary tract infection)     SURGICAL HISTORY: Past Surgical History:  Procedure Laterality Date   CYSTOSCOPY N/A 12/16/2021   Procedure: CYSTOSCOPY WITH REMOVAL OF FOREIGN BODY;  Surgeon: Dustin Gimenez, MD;  Location: [facility];  Service: Urology;  Laterality: N/A;   HOLEP-LASER ENUCLEATION OF THE PROSTATE WITH MORCELLATION N/A 03/11/2021   Procedure: HOLEP-LASER ENUCLEATION OF THE PROSTATE WITH MORCELLATION;  Surgeon: Dustin Gimenez, MD;  Location: [facility];  Service: Urology;  Laterality: N/A;   KNEE SURGERY  2002   meniscus   TRANSURETHRAL RESECTION OF PROSTATE N/A 12/16/2021   Procedure: TRANSURETHRAL RESECTION OF THE PROSTATE (TURP);  Surgeon: Dustin Gimenez,  MD;  Location: [facility];  Service: Urology;  Laterality: N/A;    SOCIAL HISTORY: Social History   Socioeconomic History   Marital status: Single    Spouse name: Not on file   Number of children: 0   Years of education: College   Highest education level: Not  on file  Occupational History   Occupation: Research officer, 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  Denies any alcohol; lives in [place].    Social Drivers of Health   Financial Resource Strain: Low Risk  (08/27/2022)   Received from [facility]   Overall Financial Resource Strain (CARDIA)    Difficulty of Paying Living Expenses: Not hard at all  Food Insecurity: No Food Insecurity (08/27/2022)   Received from [facility]   Hunger Vital Sign    Worried About Running Out of Food in the Last Year: Never true    Ran Out of Food in the Last Year: Never true  Transportation Needs: No Transportation Needs (08/27/2022)   Received from [facility] - Transportation    In the past 12 months, has lack of transportation kept you from medical appointments or from getting medications?: No    Lack of Transportation (Non-Medical): No  Physical Activity: Not on file  Stress: Not on file  Social Connections: Not on file  Intimate Partner Violence: Not on file    FAMILY HISTORY: Family History  Problem Relation Age of Onset   Cancer Sister        breast CA     ALLERGIES:  is allergic to gazyva  [obinutuzumab ].  MEDICATIONS:  Current Outpatient Medications  Medication Sig Dispense Refill   acyclovir  (ZOVIRAX ) 400 MG tablet Take 1 tablet (400 mg total) by mouth 2 (two) times daily. 60 tablet 4   allopurinol  (ZYLOPRIM ) 300 MG tablet Take 1 tablet (300 mg total) by mouth 2 (two) times daily. 60 tablet 4   Multiple Vitamin  (MULTIVITAMIN) tablet Take 1 tablet by mouth daily.     rosuvastatin  (CRESTOR ) 5 MG tablet Take 1 tablet by mouth at bedtime.     sulfamethoxazole -trimethoprim  (BACTRIM  DS) 800-160 MG tablet One pill on Monday;wed;Friday 30 tablet 1   venetoclax  (VENCLEXTA ) 100 MG tablet Take 4 tablets (400 mg total) by mouth daily. Tablets should be swallowed whole with a meal and a full glass of water. 120 tablet 0   ondansetron  (ZOFRAN ) 8 MG tablet One pill every 8 hours as needed for nausea/vomitting. (Patient not taking: Reported on 04/29/2023) 40 tablet 1   prochlorperazine  (COMPAZINE ) 10 MG tablet Take 1 tablet (10 mg total) by mouth every 6 (six) hours as needed for nausea or vomiting. (Patient not taking: Reported on 04/29/2023) 40 tablet 1   No current facility-administered medications for this visit.    PHYSICAL EXAMINATION:  Vitals:   04/29/23 0843  BP: 119/78  Pulse: 72  Resp: 16  Temp: 98.5 F (36.9 C)  SpO2: 100%  Filed Weights   04/29/23 0843  Weight: 208 lb 6.4 oz (94.5 kg)   At Baseline- Shotty bilateral neck adenopathy.  1 to 2 cm lymph node bilateral axilla left more than right.  Mild lymphadenopathy in the right inguinal region  3/12- resolution of lymphadenopathy  Physical Exam Vitals and nursing note reviewed.  HENT:     Head: Normocephalic and atraumatic.     Mouth/Throat:     Pharynx: Oropharynx is clear.  Eyes:     Extraocular Movements: Extraocular movements intact.     Pupils: Pupils are equal, round, and reactive to light.  Cardiovascular:     Rate and Rhythm: Normal rate and regular rhythm.  Pulmonary:     Comments: Decreased breath sounds bilaterally.  Abdominal:     Palpations: Abdomen is soft.  Musculoskeletal:        General: Normal range of motion.     Cervical back: Normal range of motion.  Skin:    General: Skin is warm.  Neurological:     General: No focal deficit present.     Mental Status: He is alert and oriented to person, place, and  time.  Psychiatric:        Behavior: Behavior normal.        Judgment: Judgment normal.     LABORATORY DATA:  I have reviewed the data as listed Lab Results  Component Value Date   WBC 2.1 (L) 04/29/2023   HGB 11.9 (L) 04/29/2023   HCT 39.5 04/29/2023   MCV 81.6 04/29/2023   PLT 325 04/29/2023   Recent Labs    03/18/23 0805 04/01/23 0816 04/29/23 0849  NA 140 140 140  K 4.0 3.8 4.0  CL 104 103 105  CO2 28 26 26   GLUCOSE 107* 110* 104*  BUN 14 16 19   CREATININE 0.89 1.00 0.87  CALCIUM  9.1 9.3 9.3  GFRNONAA >60 >60 >60  PROT 6.5 7.0 7.2  ALBUMIN 4.2 4.4 4.2  AST 18 20 17   ALT 14 23 14   ALKPHOS 50 57 69  BILITOT 0.8 0.8 0.7    RADIOGRAPHIC STUDIES: I have personally reviewed the radiological images as listed and agreed with the findings in the report. No results found.    CLL (chronic lymphocytic leukemia) (HCC) #CLL-CD38 positive stage III [retroperitoneal lymph nodes]-Asymptomatic. I[facility]- UNMUTATED; JULY  FISH panel- 12 trisomy-   DEC 18th, 2024-  up to 17 mm- Stable lymphadenopathy involving the chest, abdomen and pelvis. enlargement of the spleen. Currently fixed duration therapy- Gazyva - started Venatoclax with cycle #2]  # HOLD  Gazyva  cycle #4 day. Labs-CBC/chemistries- ANC 0.8 were reviewed with the patient. -ALSO  HOLD [venetoclax  400mg /day] today-  DO NOT take venetoclax  until next visit.  Will plan to start venetoclax  at 300 mg daily at next visit.  Discussed with pharmacy.  Will plan imaging after 6 cycles of therapy.    # Gazyva  infusion- G-1-2- stable.   #History of urinary retention /prostatomegaly /prostate cancer - on surveiilaince [Dr.Brandon]incidental-  2.1 x 0.9 cm stone or calcified mucosal lesion posterior left bladder wall in the region of the left UVJ.  Dr.Brandon.  stable.    # CKD stage II-III-   stable  # MS clinically   stable        # Secondary cancers: colo-[march 2024; [place]]; recommend surveillaince with dermatology-    stable  # Vaccination: discussed re: Flu shots; pneumonia vaccination [s/p Shigles vacciation- in past]- stable.   # Hx of Gout Elevated uric acid  9.5-second of CLL-on allopurinol  increased dose to 300 BID-   stable  # ID prophylaxis: continue Bactrim  DS; and Acyclovir .   # IV access: PIV   PS # DISPOSITION: #  HOLD  Gazyva  today-  # follow up in 2  weeks- MD; labs- cbc/cmp; LDH;  mag; phos;uric acid   Gazyva -  Dr.B  All questions were answered. The patient knows to call the clinic with any problems, questions or concerns.     Gwyn Leos, MD 04/29/2023 9:38 AM

## 2023-04-29 NOTE — Assessment & Plan Note (Addendum)
#  CLL-CD38 positive stage III [retroperitoneal lymph nodes]-Asymptomatic. IGVH- UNMUTATED; JULY  FISH panel- 12 trisomy-   DEC 18th, 2024-  up to 17 mm- Stable lymphadenopathy involving the chest, abdomen and pelvis. enlargement of the spleen. Currently fixed duration therapy- Gazyva - started Venatoclax with cycle #2]  # HOLD  Gazyva  cycle #4 day. Labs-CBC/chemistries- ANC 0.8 were reviewed with the patient. -ALSO  HOLD [venetoclax  400mg /day] today-  DO NOT take venetoclax  until next visit.  Will plan to start venetoclax  at 300 mg daily at next visit.  Discussed with pharmacy.  Will plan imaging after 6 cycles of therapy.    # Gazyva  infusion- G-1-2- stable.   #History of urinary retention /prostatomegaly /prostate cancer - on surveiilaince [Dr.Brandon]incidental-  2.1 x 0.9 cm stone or calcified mucosal lesion posterior left bladder wall in the region of the left UVJ.  Dr.Brandon.  stable.    # CKD stage II-III-   stable  # MS clinically   stable        # Secondary cancers: colo-[march 2024; [place]]; recommend surveillaince with dermatology-   stable  # Vaccination: discussed re: Flu shots; pneumonia vaccination [s/p Shigles vacciation- in past]- stable.   # Hx of Gout Elevated uric acid 9.5-second of CLL-on allopurinol  increased dose to 300 BID-   stable  # ID prophylaxis: continue Bactrim  DS; and Acyclovir .   # IV access: PIV   PS # DISPOSITION: #  HOLD  Gazyva  today-  # follow up in 2  weeks- MD; labs- cbc/cmp; LDH;  mag; phos;uric acid   Gazyva -  Dr.B

## 2023-04-29 NOTE — Progress Notes (Signed)
 No concerns today

## 2023-04-29 NOTE — Patient Instructions (Signed)
#   DO NOT take venetoclax  until next visit.  Will plan to start venetoclax  at 300 mg daily at next visit

## 2023-04-30 ENCOUNTER — Other Ambulatory Visit: Payer: Self-pay | Admitting: Pharmacist

## 2023-04-30 ENCOUNTER — Other Ambulatory Visit (HOSPITAL_COMMUNITY): Payer: Self-pay

## 2023-04-30 DIAGNOSIS — C9110 Chronic lymphocytic leukemia of B-cell type not having achieved remission: Secondary | ICD-10-CM

## 2023-04-30 MED ORDER — VENETOCLAX 100 MG PO TABS
300.0000 mg | ORAL_TABLET | Freq: Every day | ORAL | 0 refills | Status: DC
  Filled 2023-04-30: qty 120, 40d supply, fill #0

## 2023-04-30 NOTE — Progress Notes (Signed)
 MD informed me on 04/29/23 that he would be dose reducing patient to venetoclax  300mg  daily after 2 week hold in therapy. Rx for new dose sent to [facility] to be placed on file for next fill

## 2023-05-12 MED FILL — Dexamethasone Sodium Phosphate Inj 100 MG/10ML: INTRAMUSCULAR | Qty: 2 | Status: AC

## 2023-05-13 ENCOUNTER — Inpatient Hospital Stay

## 2023-05-13 ENCOUNTER — Inpatient Hospital Stay: Admitting: Internal Medicine

## 2023-05-13 ENCOUNTER — Other Ambulatory Visit: Payer: Self-pay

## 2023-05-13 ENCOUNTER — Inpatient Hospital Stay: Attending: Internal Medicine

## 2023-05-13 ENCOUNTER — Encounter: Payer: Self-pay | Admitting: Internal Medicine

## 2023-05-13 VITALS — BP 115/80 | HR 63 | Temp 98.1°F | Resp 16 | Ht 72.0 in | Wt 209.9 lb

## 2023-05-13 VITALS — BP 107/68 | HR 73 | Temp 97.6°F | Resp 17

## 2023-05-13 DIAGNOSIS — Z7969 Long term (current) use of other immunomodulators and immunosuppressants: Secondary | ICD-10-CM | POA: Insufficient documentation

## 2023-05-13 DIAGNOSIS — Z79624 Long term (current) use of inhibitors of nucleotide synthesis: Secondary | ICD-10-CM | POA: Diagnosis not present

## 2023-05-13 DIAGNOSIS — Z79899 Other long term (current) drug therapy: Secondary | ICD-10-CM | POA: Insufficient documentation

## 2023-05-13 DIAGNOSIS — Z5112 Encounter for antineoplastic immunotherapy: Secondary | ICD-10-CM | POA: Insufficient documentation

## 2023-05-13 DIAGNOSIS — C9110 Chronic lymphocytic leukemia of B-cell type not having achieved remission: Secondary | ICD-10-CM | POA: Insufficient documentation

## 2023-05-13 LAB — CMP (CANCER CENTER ONLY)
ALT: 20 U/L (ref 0–44)
AST: 24 U/L (ref 15–41)
Albumin: 4.5 g/dL (ref 3.5–5.0)
Alkaline Phosphatase: 68 U/L (ref 38–126)
Anion gap: 9 (ref 5–15)
BUN: 20 mg/dL (ref 6–20)
CO2: 27 mmol/L (ref 22–32)
Calcium: 9.3 mg/dL (ref 8.9–10.3)
Chloride: 104 mmol/L (ref 98–111)
Creatinine: 0.93 mg/dL (ref 0.61–1.24)
GFR, Estimated: >60 mL/min (ref >60)
Glucose, Bld: 108 mg/dL — ABNORMAL HIGH (ref 70–99)
Potassium: 3.9 mmol/L (ref 3.5–5.1)
Sodium: 140 mmol/L (ref 135–145)
Total Bilirubin: 0.5 mg/dL (ref 0.0–1.2)
Total Protein: 7.1 g/dL (ref 6.5–8.1)

## 2023-05-13 LAB — CBC WITH DIFFERENTIAL (CANCER CENTER ONLY)
Abs Immature Granulocytes: 0.01 10*3/uL (ref 0.00–0.07)
Basophils Absolute: 0 10*3/uL (ref 0.0–0.1)
Basophils Relative: 1 %
Eosinophils Absolute: 0 10*3/uL (ref 0.0–0.5)
Eosinophils Relative: 1 %
HCT: 42.1 % (ref 39.0–52.0)
Hemoglobin: 12.6 g/dL — ABNORMAL LOW (ref 13.0–17.0)
Immature Granulocytes: 0 %
Lymphocytes Relative: 40 %
Lymphs Abs: 1.5 10*3/uL (ref 0.7–4.0)
MCH: 24.2 pg — ABNORMAL LOW (ref 26.0–34.0)
MCHC: 29.9 g/dL — ABNORMAL LOW (ref 30.0–36.0)
MCV: 81 fL (ref 80.0–100.0)
Monocytes Absolute: 0.3 10*3/uL (ref 0.1–1.0)
Monocytes Relative: 8 %
Neutro Abs: 1.9 10*3/uL (ref 1.7–7.7)
Neutrophils Relative %: 50 %
Platelet Count: 237 10*3/uL (ref 150–400)
RBC: 5.2 MIL/uL (ref 4.22–5.81)
RDW: 16.1 % — ABNORMAL HIGH (ref 11.5–15.5)
WBC Count: 3.9 10*3/uL — ABNORMAL LOW (ref 4.0–10.5)
nRBC: 0 % (ref 0.0–0.2)

## 2023-05-13 LAB — URIC ACID: Uric Acid, Serum: 2.4 mg/dL — ABNORMAL LOW (ref 3.7–8.6)

## 2023-05-13 LAB — LACTATE DEHYDROGENASE: LDH: 163 U/L (ref 98–192)

## 2023-05-13 LAB — MAGNESIUM: Magnesium: 1.9 mg/dL (ref 1.7–2.4)

## 2023-05-13 LAB — PHOSPHORUS: Phosphorus: 4.5 mg/dL (ref 2.5–4.6)

## 2023-05-13 MED ORDER — SODIUM CHLORIDE 0.9 % IV SOLN
1000.0000 mg | Freq: Once | INTRAVENOUS | Status: AC
  Administered 2023-05-13: 1000 mg via INTRAVENOUS
  Filled 2023-05-13: qty 40

## 2023-05-13 MED ORDER — DEXAMETHASONE SODIUM PHOSPHATE 100 MG/10ML IJ SOLN
20.0000 mg | Freq: Once | INTRAMUSCULAR | Status: AC
  Administered 2023-05-13: 20 mg via INTRAVENOUS
  Filled 2023-05-13: qty 2
  Filled 2023-05-13: qty 20

## 2023-05-13 MED ORDER — SODIUM CHLORIDE 0.9 % IV SOLN
INTRAVENOUS | Status: DC
  Filled 2023-05-13: qty 250

## 2023-05-13 MED ORDER — SODIUM CHLORIDE 0.9% FLUSH
10.0000 mL | INTRAVENOUS | Status: DC | PRN
Start: 2023-05-13 — End: 2023-05-13
  Filled 2023-05-13: qty 10

## 2023-05-13 MED ORDER — MONTELUKAST SODIUM 10 MG PO TABS
10.0000 mg | ORAL_TABLET | Freq: Once | ORAL | Status: AC
  Administered 2023-05-13: 10 mg via ORAL
  Filled 2023-05-13: qty 1

## 2023-05-13 MED ORDER — ACETAMINOPHEN 325 MG PO TABS
650.0000 mg | ORAL_TABLET | Freq: Once | ORAL | Status: AC
  Administered 2023-05-13: 650 mg via ORAL
  Filled 2023-05-13: qty 2

## 2023-05-13 MED ORDER — FAMOTIDINE IN NACL 20-0.9 MG/50ML-% IV SOLN
20.0000 mg | Freq: Once | INTRAVENOUS | Status: AC
  Administered 2023-05-13: 20 mg via INTRAVENOUS
  Filled 2023-05-13: qty 50

## 2023-05-13 MED ORDER — DIPHENHYDRAMINE HCL 50 MG/ML IJ SOLN
50.0000 mg | Freq: Once | INTRAMUSCULAR | Status: AC
  Administered 2023-05-13: 50 mg via INTRAVENOUS
  Filled 2023-05-13: qty 1

## 2023-05-13 NOTE — Progress Notes (Signed)
 Nutrition Follow-up:  Patient with CLL.  Patient on gazyva  and venetoclax .    Met with patient during infusion.  Reports good appetite.  Eating 3 meals a day.  Trying to include protein rich foods, fruit, vegetables.  Denies nutrition impact symptoms    Medications: reviewed  Labs: reviewed  Anthropometrics:   Weight 209 lb 14.4 oz today  200 lb on 2/12   NUTRITION DIAGNOSIS: none at this time   INTERVENTION:  Encouraged well balanced diet including good sources of protein and plant foods    MONITORING, EVALUATION, GOAL: weight trends, intake   NEXT VISIT: as needed  Ralph [name], CSO, LDN Registered Dietitian [phone]

## 2023-05-13 NOTE — Progress Notes (Signed)
 [facility] CONSULT NOTE  Patient Care Team: Rory Collard, MD as PCP - General (Family Medicine) Gwyn Leos, MD as Consulting Physician (Oncology)  CHIEF COMPLAINTS/PURPOSE OF CONSULTATION: CLL  Oncology History Overview Note  Chronic lymphocytic leukemia (CLL), positive for CD20, CD22, CD19 and  CD38,  see comment.   ANNOTATION COMMENT IMP Comment VC   Comment: (NOTE)  Expression of CD38 on >30% of the clonal B-cells is reported to be an  unfavorable prognostic factor in CLL.     # CLL stage I- [lymphocytosis/retroperitoneal lymphadenopathy up to 2.5 cm-incidental CT protocol- 2023];   APRIL 2024- consistent with trisomy 12. Results for  CCND1/IGH, ATM, 13q and TP53 were normal.   # JAN 14th, 2024- Gazyva +  [ started Venatoclax with cycle #2]  with cycle # 4- Re-start at venetoclax  300mg /day [ANC 0.6- late APRIL 2025]  #GEN 2023-bilateral severe hydronephrosis-bladder outlet obstruction/status post catheter [Dr.Brandon]  #History of prostate cancer under surveillance [[facility]]   CLL (chronic lymphocytic leukemia) (HCC)  02/11/2021 Initial Diagnosis   CLL (chronic lymphocytic leukemia) (HCC)   01/20/2023 -  Chemotherapy   Patient is on Treatment Plan : LYMPHOMA CLL/SLL Venetoclax  + Obinutuzumab  q28d      HISTORY OF PRESENTING ILLNESS: Alone.  Ambulating independently.  Ralph [name] 58 y.o.  male history of multiple sclerosis-not on active therapy; prostate cancer on surveillance; CLL currently on Gazyva  [ started Venatoclax with cycle #2] is here for a follow-up.  Patient Gazyva  infusion was held 2 weeks ago because of neutropenia.  Patient denies any fevers or chills.  Patient is doing well.  Appetite is reported as good.  Gained weight. Denies any lumps or bumps.   Denies nausea. Bowels normal. No fevers at home. No pain. No diarrhea. Denies neuropathy.     Review of Systems  Constitutional:  Negative for chills, diaphoresis, fever and  malaise/fatigue.  HENT:  Negative for nosebleeds and sore throat.   Eyes:  Negative for double vision.  Respiratory:  Negative for cough, hemoptysis, sputum production, shortness of breath and wheezing.   Cardiovascular:  Negative for chest pain, palpitations, orthopnea and leg swelling.  Gastrointestinal:  Negative for abdominal pain, blood in stool, constipation, diarrhea, heartburn, melena, nausea and vomiting.  Genitourinary:  Negative for dysuria, frequency and urgency.  Musculoskeletal:  Negative for back pain and joint pain.  Skin: Negative.  Negative for itching and rash.  Neurological:  Negative for dizziness, tingling, focal weakness, weakness and headaches.  Endo/Heme/Allergies:  Does not bruise/bleed easily.  Psychiatric/Behavioral:  Negative for depression. The patient is not nervous/anxious and does not have insomnia.      MEDICAL HISTORY:  Past Medical History:  Diagnosis Date   Acute renal failure (HCC)    Anxiety    Bladder outlet obstruction    CLL (chronic lymphocytic leukemia) (HCC)    Elevated PSA    Foreign body of bladder    MS (multiple sclerosis) (HCC)    Pre-diabetes    Prostate cancer (HCC) 2013   UTI (urinary tract infection)     SURGICAL HISTORY: Past Surgical History:  Procedure Laterality Date   CYSTOSCOPY N/A 12/16/2021   Procedure: CYSTOSCOPY WITH REMOVAL OF FOREIGN BODY;  Surgeon: Dustin Gimenez, MD;  Location: [facility] ORS;  Service: Urology;  Laterality: N/A;   HOLEP-LASER ENUCLEATION OF THE PROSTATE WITH MORCELLATION N/A 03/11/2021   Procedure: HOLEP-LASER ENUCLEATION OF THE PROSTATE WITH MORCELLATION;  Surgeon: Dustin Gimenez, MD;  Location: [facility] OR[facility];  Service: Urology;  Laterality: N/A;  KNEE SURGERY  2002   meniscus   TRANSURETHRAL RESECTION OF PROSTATE N/A 12/16/2021   Procedure: TRANSURETHRAL RESECTION OF THE PROSTATE (TURP);  Surgeon: Dustin Gimenez, MD;  Location: [facility] ORS;  Service: Urology;  Laterality: N/A;    SOCIAL  HISTORY: Social History   Socioeconomic History   Marital status: Single    Spouse name: Not on file   Number of children: 0   Years of education: College   Highest education level: Not on file  Occupational History   Occupation: Research officer, 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  Denies any alcohol; lives in [place].    Social Drivers of Health   Financial Resource Strain: Low Risk  (08/27/2022)   Received [facility]   Overall Financial Resource Strain (CARDIA)    Difficulty of Paying Living Expenses: Not hard at all  Food Insecurity: No Food Insecurity (08/27/2022)   Received from [facility]   Hunger Vital Sign    Worried About Running Out of Food in the Last Year: Never true    Ran Out of Food in the Last Year: Never true  Transportation Needs: No Transportation Needs (08/27/2022)   Received from [facility] - Transportation    In the past 12 months, has lack of transportation kept you from medical appointments or from getting medications?: No    Lack of Transportation (Non-Medical): No  Physical Activity: Not on file  Stress: Not on file  Social Connections: Not on file  Intimate Partner Violence: Not on file    FAMILY HISTORY: Family History  Problem Relation Age of Onset   Cancer Sister        breast CA     ALLERGIES:  is allergic to gazyva  [obinutuzumab ].  MEDICATIONS:  Current Outpatient Medications  Medication Sig Dispense Refill   acyclovir  (ZOVIRAX ) 400 MG tablet Take 1 tablet  (400 mg total) by mouth 2 (two) times daily. 60 tablet 4   allopurinol  (ZYLOPRIM ) 300 MG tablet Take 1 tablet (300 mg total) by mouth 2 (two) times daily. 60 tablet 4   Multiple Vitamin (MULTIVITAMIN) tablet Take 1 tablet by mouth daily.     rosuvastatin  (CRESTOR ) 5 MG tablet Take 1 tablet by mouth at bedtime.     sulfamethoxazole -trimethoprim  (BACTRIM  DS) 800-160 MG tablet One pill on Monday;wed;Friday 30 tablet 1   ondansetron  (ZOFRAN ) 8 MG tablet One pill every 8 hours as needed for nausea/vomitting. (Patient not taking: Reported on 05/13/2023) 40 tablet 1   prochlorperazine  (COMPAZINE ) 10 MG tablet Take 1 tablet (10 mg total) by mouth every 6 (six) hours as needed for nausea or vomiting. (Patient not taking: Reported on 05/13/2023) 40 tablet 1   venetoclax  (VENCLEXTA ) 100 MG tablet Take 3 tablets (300 mg total) by mouth daily. Tablets should be swallowed whole with a meal and a full glass of water. (Patient not taking: Reported on 05/13/2023) 84 tablet 0   No current facility-administered  medications for this visit.    PHYSICAL EXAMINATION:  Vitals:   05/13/23 0802  BP: 115/80  Pulse: 63  Resp: 16  Temp: 98.1 F (36.7 C)  SpO2: 100%     Filed Weights   05/13/23 0802  Weight: 209 lb 14.4 oz (95.2 kg)   At Baseline- Shotty bilateral neck adenopathy.  1 to 2 cm lymph node bilateral axilla left more than right.  Mild lymphadenopathy in the right inguinal region  3/12- resolution of lymphadenopathy  Physical Exam Vitals and nursing note reviewed.  HENT:     Head: Normocephalic and atraumatic.     Mouth/Throat:     Pharynx: Oropharynx is clear.  Eyes:     Extraocular Movements: Extraocular movements intact.     Pupils: Pupils are equal, round, and reactive to light.  Cardiovascular:     Rate and Rhythm: Normal rate and regular rhythm.  Pulmonary:     Comments: Decreased breath sounds bilaterally.  Abdominal:     Palpations: Abdomen is soft.  Musculoskeletal:        General:  Normal range of motion.     Cervical back: Normal range of motion.  Skin:    General: Skin is warm.  Neurological:     General: No focal deficit present.     Mental Status: He is alert and oriented to person, place, and time.  Psychiatric:        Behavior: Behavior normal.        Judgment: Judgment normal.     LABORATORY DATA:  I have reviewed the data as listed Lab Results  Component Value Date   WBC 3.9 (L) 05/13/2023   HGB 12.6 (L) 05/13/2023   HCT 42.1 05/13/2023   MCV 81.0 05/13/2023   PLT 237 05/13/2023   Recent Labs    04/01/23 0816 04/29/23 0849 05/13/23 0804  NA 140 140 140  K 3.8 4.0 3.9  CL 103 105 104  CO2 26 26 27   GLUCOSE 110* 104* 108*  BUN 16 19 20   CREATININE 1.00 0.87 0.93  CALCIUM  9.3 9.3 9.3  GFRNONAA >60 >60 >60  PROT 7.0 7.2 7.1  ALBUMIN 4.4 4.2 4.5  AST 20 17 24   ALT 23 14 20   ALKPHOS 57 69 68  BILITOT 0.8 0.7 0.5    RADIOGRAPHIC STUDIES: I have personally reviewed the radiological images as listed and agreed with the findings in the report. No results found.    CLL (chronic lymphocytic leukemia) (HCC) #CLL-CD38 positive stage III [retroperitoneal lymph nodes]-Asymptomatic. IGVH- UNMUTATED; JULY  FISH panel- 12 trisomy-   DEC 18th, 2024-  up to 17 mm- Stable lymphadenopathy involving the chest, abdomen and pelvis. enlargement of the spleen. Currently fixed duration therapy- Gazyva - started Venatoclax with cycle #2]  # Proceed with Gazyva  cycle #4 day. Labs-CBC/chemistries- ANC 2.5  were reviewed with the patient. -Re-start at venetoclax  300mg /day] today-  Discussed with pharmacy.  Will plan imaging after 6 cycles of therapy.    # Gazyva  infusion- G-1-2- stable.   #History of urinary retention /prostatomegaly /prostate cancer - on surveiilaince [Dr.Brandon]incidental-  2.1 x 0.9 cm stone or calcified mucosal lesion posterior left bladder wall in the region of the left UVJ.  Dr.Brandon.  stable.    # CKD stage II-III-   stable  # MS  clinically   stable        # Secondary cancers: colo-[march 2024; [place]]; recommend surveillaince with dermatology-   stable  # Vaccination: discussed re: Flu shots; pneumonia  vaccination [s/p Shigles vacciation- in past]- stable.   # Hx of Gout Elevated uric acid 9.5-second of CLL-on allopurinol  increased dose to 300 BID-   stable  # ID prophylaxis: continue Bactrim  DS; and Acyclovir -  stable  # ACP [05/13/2023] Patient has stage IV disease which is usually incurable.  However patient is currently doing well without any evidence of progression.  Patient interested in continue current scope of therapy; and full code.  # IV access: PIV   PS # DISPOSITION: # proceed Gazyva  today-  # follow up in 4  weeks- MD; labs- cbc/cmp; LDH;  mag; phos;uric acid - Gazyva -  Dr.B  All questions were answered. The patient knows to call the clinic with any problems, questions or concerns.     Ralph Keetch R Infantof Villagomez, MD 05/13/2023 9:00 AM

## 2023-05-13 NOTE — Patient Instructions (Signed)
 [facility] - A DEPT OF [facility]  Discharge Instructions: Thank you for choosing [facility] to provide your oncology and hematology care.  If you have a lab appointment with the Cancer Center, please go directly to the Cancer Center and check in at the registration area.  Wear comfortable clothing and clothing appropriate for easy access to any Portacath or PICC line.   We strive to give you quality time with your provider. You may need to reschedule your appointment if you arrive late (15 or more minutes).  Arriving late affects you and other patients whose appointments are after yours.  Also, if you miss three or more appointments without notifying the office, you may be dismissed from the clinic at the provider's discretion.      For prescription refill requests, have your pharmacy contact our office and allow 72 hours for refills to be completed.    Today you received the following chemotherapy and/or immunotherapy agents: obinutuzumab     To help prevent nausea and vomiting after your treatment, we encourage you to take your nausea medication as directed.  BELOW ARE SYMPTOMS THAT SHOULD BE REPORTED IMMEDIATELY: *FEVER GREATER THAN 100.4 F (38 C) OR HIGHER *CHILLS OR SWEATING *NAUSEA AND VOMITING THAT IS NOT CONTROLLED WITH YOUR NAUSEA MEDICATION *UNUSUAL SHORTNESS OF BREATH *UNUSUAL BRUISING OR BLEEDING *URINARY PROBLEMS (pain or burning when urinating, or frequent urination) *BOWEL PROBLEMS (unusual diarrhea, constipation, pain near the anus) TENDERNESS IN MOUTH AND THROAT WITH OR WITHOUT PRESENCE OF ULCERS (sore throat, sores in mouth, or a toothache) UNUSUAL RASH, SWELLING OR PAIN  UNUSUAL VAGINAL DISCHARGE OR ITCHING   Items with * indicate a potential emergency and should be followed up as soon as possible or go to the Emergency Department if any problems should occur.  Please show the CHEMOTHERAPY ALERT CARD or IMMUNOTHERAPY  ALERT CARD at check-in to the Emergency Department and triage nurse.  Should you have questions after your visit or need to cancel or reschedule your appointment, please contact [facility] - A DEPT OF [facility]  [phone] and follow the prompts.  Office hours are 8:00 a.m. to 4:30 p.m. Monday - Friday. Please note that voicemails left after 4:00 p.m. may not be returned until the following business day.  We are closed weekends and major holidays. You have access to a nurse at all times for urgent questions. Please call the main number to the clinic [phone] and follow the prompts.  For any non-urgent questions, you may also contact your provider using MyChart. We now offer e-Visits for anyone 25 and older to request care online for non-urgent symptoms. For details visit [email or web address].   Also download the MyChart app! Go to the app store, search "MyChart", open the app, select [facility], and log in with your MyChart username and password.

## 2023-05-13 NOTE — Assessment & Plan Note (Addendum)
#  CLL-CD38 positive stage III [retroperitoneal lymph nodes]-Asymptomatic. IGVH- UNMUTATED; JULY  FISH panel- 12 trisomy-   DEC 18th, 2024-  up to 17 mm- Stable lymphadenopathy involving the chest, abdomen and pelvis. enlargement of the spleen. Currently fixed duration therapy- Gazyva - started Venatoclax with cycle #2]  # Proceed with Gazyva  cycle #4 day. Labs-CBC/chemistries- ANC 2.5  were reviewed with the patient. -Re-start at venetoclax  300mg /day] today-  Discussed with pharmacy.  Will plan imaging after 6 cycles of therapy.    # Gazyva  infusion- G-1-2- stable.   #History of urinary retention /prostatomegaly /prostate cancer - on surveiilaince [Dr.Brandon]incidental-  2.1 x 0.9 cm stone or calcified mucosal lesion posterior left bladder wall in the region of the left UVJ.  Dr.Brandon.  stable.    # CKD stage II-III-   stable  # MS clinically   stable        # Secondary cancers: colo-[march 2024; [place]]; recommend surveillaince with dermatology-   stable  # Vaccination: discussed re: Flu shots; pneumonia vaccination [s/p Shigles vacciation- in past]- stable.   # Hx of Gout Elevated uric acid 9.5-second of CLL-on allopurinol  increased dose to 300 BID-   stable  # ID prophylaxis: continue Bactrim  DS; and Acyclovir -  stable  # ACP [05/13/2023] Patient has stage IV disease which is usually incurable.  However patient is currently doing well without any evidence of progression.  Patient interested in continue current scope of therapy; and full code.  # IV access: PIV   PS # DISPOSITION: # proceed Gazyva  today-  # follow up in 4  weeks- MD; labs- cbc/cmp; LDH;  mag; phos;uric acid - Gazyva -  Dr.B

## 2023-05-13 NOTE — Progress Notes (Signed)
 No concerns today

## 2023-05-14 ENCOUNTER — Other Ambulatory Visit: Payer: Self-pay

## 2023-05-20 ENCOUNTER — Other Ambulatory Visit: Payer: Self-pay

## 2023-05-20 NOTE — Progress Notes (Signed)
 Specialty Pharmacy Ongoing Clinical Assessment Note  Ralph [name] is a 58 y.o. male who is being followed by the specialty pharmacy service for RxSp Oncology   Patient's specialty medication(s) reviewed today: Venetoclax  (VENCLEXTA )   Missed doses in the last 4 weeks: More than 5   Patient/Caregiver did not have any additional questions or concerns.   Therapeutic benefit summary: Patient is achieving benefit   Adverse events/side effects summary: Experienced adverse events/side effects (lab changes (ANC) since resolved)   Patient's therapy is appropriate to: Continue    Goals Addressed             This Visit's Progress    Achieve or maintain remission       Patient is unable to be assessed as therapy was recently initiated. Patient will maintain adherence and be monitored by provider to determine if a change in treatment plan is warranted. Therapy held from 4/23-5/7 per provider and restarted at lower dose 300mg  daily.           Follow up: 3 months  Harlin Mazzoni M Toshiyuki Fredell Specialty Pharmacist

## 2023-06-03 ENCOUNTER — Other Ambulatory Visit: Payer: Self-pay

## 2023-06-03 ENCOUNTER — Telehealth: Payer: Self-pay | Admitting: *Deleted

## 2023-06-03 MED ORDER — SULFAMETHOXAZOLE-TRIMETHOPRIM 800-160 MG PO TABS: ORAL_TABLET | ORAL | 1 refills | Status: DC

## 2023-06-03 MED ORDER — ALLOPURINOL 300 MG PO TABS: 300.0000 mg | ORAL_TABLET | Freq: Two times a day (BID) | ORAL | 4 refills | Status: DC

## 2023-06-03 NOTE — Telephone Encounter (Signed)
 Refill requests

## 2023-06-08 ENCOUNTER — Other Ambulatory Visit: Payer: Self-pay

## 2023-06-09 MED FILL — Dexamethasone Sodium Phosphate Inj 100 MG/10ML: INTRAMUSCULAR | Qty: 2 | Status: AC

## 2023-06-10 ENCOUNTER — Inpatient Hospital Stay: Attending: Internal Medicine

## 2023-06-10 ENCOUNTER — Inpatient Hospital Stay: Admitting: Internal Medicine

## 2023-06-10 ENCOUNTER — Encounter: Payer: Self-pay | Admitting: Internal Medicine

## 2023-06-10 ENCOUNTER — Other Ambulatory Visit: Payer: Self-pay

## 2023-06-10 ENCOUNTER — Inpatient Hospital Stay

## 2023-06-10 VITALS — BP 107/68 | HR 71 | Temp 98.0°F | Resp 17

## 2023-06-10 VITALS — BP 115/78 | HR 68 | Temp 98.2°F | Resp 16 | Ht 72.0 in | Wt 216.6 lb

## 2023-06-10 DIAGNOSIS — C9110 Chronic lymphocytic leukemia of B-cell type not having achieved remission: Secondary | ICD-10-CM | POA: Diagnosis not present

## 2023-06-10 DIAGNOSIS — Z79624 Long term (current) use of inhibitors of nucleotide synthesis: Secondary | ICD-10-CM | POA: Insufficient documentation

## 2023-06-10 DIAGNOSIS — Z5112 Encounter for antineoplastic immunotherapy: Secondary | ICD-10-CM | POA: Insufficient documentation

## 2023-06-10 DIAGNOSIS — Z7969 Long term (current) use of other immunomodulators and immunosuppressants: Secondary | ICD-10-CM | POA: Diagnosis not present

## 2023-06-10 DIAGNOSIS — Z79899 Other long term (current) drug therapy: Secondary | ICD-10-CM | POA: Diagnosis not present

## 2023-06-10 LAB — CMP (CANCER CENTER ONLY)
ALT: 19 U/L (ref 0–44)
AST: 23 U/L (ref 15–41)
Albumin: 4.4 g/dL (ref 3.5–5.0)
Alkaline Phosphatase: 85 U/L (ref 38–126)
Anion gap: 9 (ref 5–15)
BUN: 22 mg/dL — ABNORMAL HIGH (ref 6–20)
CO2: 25 mmol/L (ref 22–32)
Calcium: 9.1 mg/dL (ref 8.9–10.3)
Chloride: 103 mmol/L (ref 98–111)
Creatinine: 1 mg/dL (ref 0.61–1.24)
GFR, Estimated: >60 mL/min (ref >60)
Glucose, Bld: 101 mg/dL — ABNORMAL HIGH (ref 70–99)
Potassium: 3.9 mmol/L (ref 3.5–5.1)
Sodium: 137 mmol/L (ref 135–145)
Total Bilirubin: 1 mg/dL (ref 0.0–1.2)
Total Protein: 7.1 g/dL (ref 6.5–8.1)

## 2023-06-10 LAB — MAGNESIUM: Magnesium: 2 mg/dL (ref 1.7–2.4)

## 2023-06-10 LAB — PHOSPHORUS: Phosphorus: 4.5 mg/dL (ref 2.5–4.6)

## 2023-06-10 LAB — CBC WITH DIFFERENTIAL (CANCER CENTER ONLY)
Abs Immature Granulocytes: 0.03 10*3/uL (ref 0.00–0.07)
Basophils Absolute: 0 10*3/uL (ref 0.0–0.1)
Basophils Relative: 1 %
Eosinophils Absolute: 0 10*3/uL (ref 0.0–0.5)
Eosinophils Relative: 0 %
HCT: 43 % (ref 39.0–52.0)
Hemoglobin: 13.2 g/dL (ref 13.0–17.0)
Immature Granulocytes: 1 %
Lymphocytes Relative: 46 %
Lymphs Abs: 1.4 10*3/uL (ref 0.7–4.0)
MCH: 24.9 pg — ABNORMAL LOW (ref 26.0–34.0)
MCHC: 30.7 g/dL (ref 30.0–36.0)
MCV: 81.1 fL (ref 80.0–100.0)
Monocytes Absolute: 0.3 10*3/uL (ref 0.1–1.0)
Monocytes Relative: 11 %
Neutro Abs: 1.2 10*3/uL — ABNORMAL LOW (ref 1.7–7.7)
Neutrophils Relative %: 41 %
Platelet Count: 257 10*3/uL (ref 150–400)
RBC: 5.3 MIL/uL (ref 4.22–5.81)
RDW: 19 % — ABNORMAL HIGH (ref 11.5–15.5)
WBC Count: 2.9 10*3/uL — ABNORMAL LOW (ref 4.0–10.5)
nRBC: 0 % (ref 0.0–0.2)

## 2023-06-10 LAB — URIC ACID: Uric Acid, Serum: 2.9 mg/dL — ABNORMAL LOW (ref 3.7–8.6)

## 2023-06-10 LAB — LACTATE DEHYDROGENASE: LDH: 166 U/L (ref 98–192)

## 2023-06-10 MED ORDER — MONTELUKAST SODIUM 10 MG PO TABS
10.0000 mg | ORAL_TABLET | Freq: Once | ORAL | Status: AC
  Administered 2023-06-10: 10 mg via ORAL
  Filled 2023-06-10: qty 1

## 2023-06-10 MED ORDER — ACYCLOVIR 400 MG PO TABS: 400.0000 mg | ORAL_TABLET | Freq: Two times a day (BID) | ORAL | 4 refills | Status: DC

## 2023-06-10 MED ORDER — DIPHENHYDRAMINE HCL 50 MG/ML IJ SOLN
50.0000 mg | Freq: Once | INTRAMUSCULAR | Status: AC
  Administered 2023-06-10: 50 mg via INTRAVENOUS
  Filled 2023-06-10: qty 1

## 2023-06-10 MED ORDER — SODIUM CHLORIDE 0.9 % IV SOLN
20.0000 mg | Freq: Once | INTRAVENOUS | Status: AC
  Administered 2023-06-10: 20 mg via INTRAVENOUS
  Filled 2023-06-10: qty 20

## 2023-06-10 MED ORDER — SODIUM CHLORIDE 0.9 % IV SOLN
INTRAVENOUS | Status: DC
  Filled 2023-06-10: qty 250

## 2023-06-10 MED ORDER — FAMOTIDINE IN NACL 20-0.9 MG/50ML-% IV SOLN
20.0000 mg | Freq: Once | INTRAVENOUS | Status: AC
  Administered 2023-06-10: 20 mg via INTRAVENOUS
  Filled 2023-06-10: qty 50

## 2023-06-10 MED ORDER — ACETAMINOPHEN 325 MG PO TABS
650.0000 mg | ORAL_TABLET | Freq: Once | ORAL | Status: AC
  Administered 2023-06-10: 650 mg via ORAL
  Filled 2023-06-10: qty 2

## 2023-06-10 MED ORDER — SODIUM CHLORIDE 0.9 % IV SOLN
1000.0000 mg | Freq: Once | INTRAVENOUS | Status: AC
  Administered 2023-06-10: 1000 mg via INTRAVENOUS
  Filled 2023-06-10: qty 40

## 2023-06-10 NOTE — Progress Notes (Signed)
 [name] CONSULT NOTE  Patient Care Team: Ralph Collard, MD as PCP - General (Family Medicine) Ralph Leos, MD as Consulting Physician (Oncology)  CHIEF COMPLAINTS/PURPOSE OF CONSULTATION: CLL  Oncology History Overview Note  Chronic lymphocytic leukemia (CLL), positive for CD20, CD22, CD19 and  CD38,  see comment.   ANNOTATION COMMENT IMP Comment VC   Comment: (NOTE)  Expression of CD38 on >30% of the clonal B-cells is reported to be an  unfavorable prognostic factor in CLL.     # CLL stage I- [lymphocytosis/retroperitoneal lymphadenopathy up to 2.5 cm-incidental CT protocol- 2023];   APRIL 2024- consistent with trisomy 12. Results for  CCND1/IGH, ATM, 13q and TP53 were normal.   # JAN 14th, 2024- Gazyva +  [ started Venatoclax with cycle #2]  with cycle # 4- Re-start at venetoclax  300mg /day [ANC 0.6- late APRIL 2025]  #GEN 2023-bilateral severe hydronephrosis-bladder outlet obstruction/status post catheter [Dr.Brandon]  #History of prostate cancer under surveillance [Ralph [name]]   CLL (chronic lymphocytic leukemia) (HCC)  02/11/2021 Initial Diagnosis   CLL (chronic lymphocytic leukemia) (HCC)   01/20/2023 -  Chemotherapy   Patient is on Treatment Plan : LYMPHOMA CLL/SLL Venetoclax  + Obinutuzumab  q28d      HISTORY OF PRESENTING ILLNESS: Alone.  Ambulating independently.  Ralph [name] 58 y.o.  male history of multiple sclerosis-not on active therapy; prostate cancer on surveillance; CLL currently on Gazyva  [ started Venatoclax with cycle #2] is here for a follow-up.  Patient denies any fevers or chills.  Patient is doing well.  Appetite is reported as good.  Gained weight. Denies any lumps or bumps.   Denies nausea. Bowels normal. No fevers at home. No pain. No diarrhea. Denies neuropathy.     Review of Systems  Constitutional:  Negative for chills, diaphoresis, fever and malaise/fatigue.  HENT:  Negative for nosebleeds and sore throat.    Eyes:  Negative for double vision.  Respiratory:  Negative for cough, hemoptysis, sputum production, shortness of breath and wheezing.   Cardiovascular:  Negative for chest pain, palpitations, orthopnea and leg swelling.  Gastrointestinal:  Negative for abdominal pain, blood in stool, constipation, diarrhea, heartburn, melena, nausea and vomiting.  Genitourinary:  Negative for dysuria, frequency and urgency.  Musculoskeletal:  Negative for back pain and joint pain.  Skin: Negative.  Negative for itching and rash.  Neurological:  Negative for dizziness, tingling, focal weakness, weakness and headaches.  Endo/Heme/Allergies:  Does not bruise/bleed easily.  Psychiatric/Behavioral:  Negative for depression. The patient is not nervous/anxious and does not have insomnia.      MEDICAL HISTORY:  Past Medical History:  Diagnosis Date   Acute renal failure (HCC)    Anxiety    Bladder outlet obstruction    CLL (chronic lymphocytic leukemia) (HCC)    Elevated PSA    Foreign body of bladder    MS (multiple sclerosis) (HCC)    Pre-diabetes    Prostate cancer (HCC) 2013   UTI (urinary tract infection)     SURGICAL HISTORY: Past Surgical History:  Procedure Laterality Date   CYSTOSCOPY N/A 12/16/2021   Procedure: CYSTOSCOPY WITH REMOVAL OF FOREIGN BODY;  Surgeon: Dustin Gimenez, MD;  Location: [facility];  Service: Urology;  Laterality: N/A;   HOLEP-LASER ENUCLEATION OF THE PROSTATE WITH MORCELLATION N/A 03/11/2021   Procedure: HOLEP-LASER ENUCLEATION OF THE PROSTATE WITH MORCELLATION;  Surgeon: Dustin Gimenez, MD;  Location: [facility];  Service: Urology;  Laterality: N/A;   KNEE SURGERY  2002   meniscus   TRANSURETHRAL  RESECTION OF PROSTATE N/A 12/16/2021   Procedure: TRANSURETHRAL RESECTION OF THE PROSTATE (TURP);  Surgeon: Dustin Gimenez, MD;  Location: [facility];  Service: Urology;  Laterality: N/A;    SOCIAL HISTORY: Social History   Socioeconomic History   Marital status: Single     Spouse name: Not on file   Number of children: 0   Years of education: College   Highest education level: Not on file  Occupational History   Occupation: Research officer, 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  Denies any alcohol; lives in [place].    Social Drivers of Health   Financial Resource Strain: Low Risk  (08/27/2022)   Received from [facility]   Overall Financial Resource Strain (CARDIA)    Difficulty of Paying Living Expenses: Not hard at all  Food Insecurity: No Food Insecurity (08/27/2022)   Received from [facility]   Hunger Vital Sign    Worried About Running Out of Food in the Last Year: Never true    Ran Out of Food in the Last Year: Never true  Transportation Needs: No Transportation Needs (08/27/2022)   [facility] - Transportation    In the past 12 months, has lack of transportation kept you from medical appointments or from getting medications?: No    Lack of Transportation (Non-Medical): No  Physical Activity: Not on file  Stress: Not on file  Social Connections: Not on file  Intimate Partner Violence: Not on file    FAMILY HISTORY: Family History  Problem Relation Age of Onset   Cancer Sister        breast CA     ALLERGIES:  is allergic to gazyva  [obinutuzumab ].  MEDICATIONS:  Current Outpatient Medications  Medication Sig Dispense Refill   allopurinol  (ZYLOPRIM ) 300 MG tablet Take 1 tablet (300 mg total) by mouth 2 (two) times daily. 60 tablet 4   Multiple Vitamin  (MULTIVITAMIN) tablet Take 1 tablet by mouth daily.     rosuvastatin  (CRESTOR ) 5 MG tablet Take 1 tablet by mouth at bedtime.     sulfamethoxazole -trimethoprim  (BACTRIM  DS) 800-160 MG tablet One pill on Monday;wed;Friday 30 tablet 1   venetoclax  (VENCLEXTA ) 100 MG tablet Take 3 tablets (300 mg total) by mouth daily. Tablets should be swallowed whole with a meal and a full glass of water. 84 tablet 0   acyclovir  (ZOVIRAX ) 400 MG tablet Take 1 tablet (400 mg total) by mouth 2 (two) times daily. 60 tablet 4   ondansetron  (ZOFRAN ) 8 MG tablet One pill every 8 hours as needed for nausea/vomitting. (Patient not taking: Reported on 06/10/2023) 40 tablet 1   prochlorperazine  (COMPAZINE ) 10 MG tablet Take 1 tablet (10 mg total) by mouth every 6 (six) hours as needed for nausea or vomiting. (Patient not taking: Reported on 06/10/2023) 40 tablet 1   No current facility-administered medications for this visit.    PHYSICAL EXAMINATION:  Vitals:   06/10/23 1610  BP: 115/78  Pulse: 68  Resp: 16  Temp: 98.2 F (36.8 C)  SpO2: 100%     Filed Weights   06/10/23 0826  Weight: 216 lb 9.6 oz (98.2 kg)   At Baseline- Shotty bilateral neck adenopathy.  1 to 2 cm lymph node bilateral axilla left more than right.  Mild lymphadenopathy in the right inguinal region  3/12- resolution of lymphadenopathy  Physical Exam Vitals and nursing note reviewed.  HENT:     Head: Normocephalic and atraumatic.     Mouth/Throat:     Pharynx: Oropharynx is clear.  Eyes:     Extraocular Movements: Extraocular movements intact.     Pupils: Pupils are equal, round, and reactive to light.  Cardiovascular:     Rate and Rhythm: Normal rate and regular rhythm.  Pulmonary:     Comments: Decreased breath sounds bilaterally.  Abdominal:     Palpations: Abdomen is soft.  Musculoskeletal:        General: Normal range of motion.     Cervical back: Normal range of motion.  Skin:    General: Skin is warm.  Neurological:      General: No focal deficit present.     Mental Status: He is alert and oriented to person, place, and time.  Psychiatric:        Behavior: Behavior normal.        Judgment: Judgment normal.     LABORATORY DATA:  I have reviewed the data as listed Lab Results  Component Value Date   WBC 2.9 (L) 06/10/2023   HGB 13.2 06/10/2023   HCT 43.0 06/10/2023   MCV 81.1 06/10/2023   PLT 257 06/10/2023   Recent Labs    04/29/23 0849 05/13/23 0804 06/10/23 0753  NA 140 140 137  K 4.0 3.9 3.9  CL 105 104 103  CO2 26 27 25   GLUCOSE 104* 108* 101*  BUN 19 20 22*  CREATININE 0.87 0.93 1.00  CALCIUM  9.3 9.3 9.1  GFRNONAA >60 >60 >60  PROT 7.2 7.1 7.1  ALBUMIN 4.2 4.5 4.4  AST 17 24 23   ALT 14 20 19   ALKPHOS 69 68 85  BILITOT 0.7 0.5 1.0    RADIOGRAPHIC STUDIES: I have personally reviewed the radiological images as listed and agreed with the findings in the report. No results found.    CLL (chronic lymphocytic leukemia) (HCC) #CLL-CD38 positive stage III [retroperitoneal lymph nodes]-Asymptomatic. IGVH- UNMUTATED; JULY  FISH panel- 12 trisomy-   DEC 18th, 2024-  up to 17 mm- Stable lymphadenopathy involving the chest, abdomen and pelvis. enlargement of the spleen. Currently fixed duration therapy- Gazyva - started Venatoclax with cycle #2]  # Proceed with Gazyva  cycle #5. Labs-CBC/chemistries- ANC 1.2  were reviewed with the patient. -CONTINUE  venetoclax  300mg /day] today- if worsening could consider 3 weeks ON and 1 week OFF-  Discussed with pharmacy.  Will plan imaging after 6 cycles of therapy.    # Gazyva  infusion- G-1-2- stable.   #History of urinary retention /prostatomegaly /prostate cancer - on surveiilaince [Dr.Brandon]incidental-  2.1 x 0.9 cm stone or calcified mucosal lesion posterior left bladder wall in the region of the left UVJ.  Dr.Brandon.  stable.    # CKD stage II-III-    stable.   # MS clinically   stable.     # Secondary cancers: colo-[march 2024; [place]];  recommend surveillaince with dermatology-   stable  # Vaccination: discussed re: Flu shots; pneumonia vaccination [s/p Shigles vacciation- in past]- stable.   #  Hx of Gout Elevated uric acid 9.5-second of CLL-on allopurinol  increased dose to 300 BID-    stable.  # ID prophylaxis: continue Bactrim  DS; and Acyclovir -  stable  # ACP [05/13/2023] Full code.  # IV access: PIV   PS # DISPOSITION: # proceed Gazyva  today-  # follow up in 4  weeks- MD; labs- cbc/cmp; LDH;  mag; phos;uric acid - Gazyva -  Dr.B  All questions were answered. The patient knows to call the clinic with any problems, questions or concerns.     Ralph Leos, MD 06/10/2023 9:11 AM

## 2023-06-10 NOTE — Assessment & Plan Note (Addendum)
#  CLL-CD38 positive stage III [retroperitoneal lymph nodes]-Asymptomatic. IGVH- UNMUTATED; JULY  FISH panel- 12 trisomy-   DEC 18th, 2024-  up to 17 mm- Stable lymphadenopathy involving the chest, abdomen and pelvis. enlargement of the spleen. Currently fixed duration therapy- Gazyva - started Venatoclax with cycle #2]  # Proceed with Gazyva  cycle #5. Labs-CBC/chemistries- ANC 1.2  were reviewed with the patient. -CONTINUE  venetoclax  300mg /day] today- if worsening could consider 3 weeks ON and 1 week OFF-  Discussed with pharmacy.  Will plan imaging after 6 cycles of therapy.    # Gazyva  infusion- G-1-2- stable.   #History of urinary retention /prostatomegaly /prostate cancer - on surveiilaince [Dr.Brandon]incidental-  2.1 x 0.9 cm stone or calcified mucosal lesion posterior left bladder wall in the region of the left UVJ.  Dr.Brandon.  stable.    # CKD stage II-III-    stable.   # MS clinically   stable.     # Secondary cancers: colo-[march 2024; [place]]; recommend surveillaince with dermatology-   stable  # Vaccination: discussed re: Flu shots; pneumonia vaccination [s/p Shigles vacciation- in past]- stable.   # Hx of Gout Elevated uric acid 9.5-second of CLL-on allopurinol  increased dose to 300 BID-    stable.  # ID prophylaxis: continue Bactrim  DS; and Acyclovir -  stable  # ACP [05/13/2023] Full code.  # IV access: PIV   PS # DISPOSITION: # proceed Gazyva  today-  # follow up in 4  weeks- MD; labs- cbc/cmp; LDH;  mag; phos;uric acid - Gazyva -  Dr.B

## 2023-06-10 NOTE — Progress Notes (Signed)
 Refill acyclovir, pended.

## 2023-06-15 ENCOUNTER — Ambulatory Visit: Admitting: Orthopedic Surgery

## 2023-06-16 DIAGNOSIS — Z1331 Encounter for screening for depression: Secondary | ICD-10-CM | POA: Diagnosis not present

## 2023-06-16 DIAGNOSIS — G35 Multiple sclerosis: Secondary | ICD-10-CM | POA: Diagnosis not present

## 2023-06-22 ENCOUNTER — Other Ambulatory Visit: Payer: Self-pay

## 2023-06-22 ENCOUNTER — Other Ambulatory Visit: Payer: Self-pay | Admitting: Internal Medicine

## 2023-06-22 DIAGNOSIS — C9110 Chronic lymphocytic leukemia of B-cell type not having achieved remission: Secondary | ICD-10-CM

## 2023-06-22 MED ORDER — VENETOCLAX 100 MG PO TABS
300.0000 mg | ORAL_TABLET | Freq: Every day | ORAL | 0 refills | Status: DC
  Filled 2023-06-23: qty 84, 28d supply, fill #0

## 2023-06-22 NOTE — Progress Notes (Signed)
 Specialty Pharmacy Refill Coordination Note  Ralph [name] is a 58 y.o. male contacted today regarding refills of specialty medication(s) Venetoclax  (VENCLEXTA )   Patient requested [name] Dk at [facility] at [place] date: 06/23/23   Medication will be filled on 06/22/23.

## 2023-06-23 ENCOUNTER — Other Ambulatory Visit: Payer: Self-pay

## 2023-06-23 ENCOUNTER — Other Ambulatory Visit (HOSPITAL_COMMUNITY): Payer: Self-pay

## 2023-07-07 MED FILL — Dexamethasone Sodium Phosphate Inj 100 MG/10ML: INTRAMUSCULAR | Qty: 2 | Status: AC

## 2023-07-08 ENCOUNTER — Encounter: Payer: Self-pay | Admitting: Internal Medicine

## 2023-07-08 ENCOUNTER — Inpatient Hospital Stay

## 2023-07-08 ENCOUNTER — Inpatient Hospital Stay (HOSPITAL_BASED_OUTPATIENT_CLINIC_OR_DEPARTMENT_OTHER): Admitting: Internal Medicine

## 2023-07-08 ENCOUNTER — Inpatient Hospital Stay: Attending: Internal Medicine

## 2023-07-08 VITALS — BP 129/67 | HR 77 | Temp 98.4°F | Resp 18

## 2023-07-08 VITALS — BP 121/80 | HR 70 | Temp 97.6°F | Resp 16 | Ht 72.0 in | Wt 217.4 lb

## 2023-07-08 DIAGNOSIS — Z79624 Long term (current) use of inhibitors of nucleotide synthesis: Secondary | ICD-10-CM | POA: Insufficient documentation

## 2023-07-08 DIAGNOSIS — Z5112 Encounter for antineoplastic immunotherapy: Secondary | ICD-10-CM | POA: Diagnosis not present

## 2023-07-08 DIAGNOSIS — G35 Multiple sclerosis: Secondary | ICD-10-CM | POA: Diagnosis not present

## 2023-07-08 DIAGNOSIS — C9110 Chronic lymphocytic leukemia of B-cell type not having achieved remission: Secondary | ICD-10-CM

## 2023-07-08 DIAGNOSIS — Z7969 Long term (current) use of other immunomodulators and immunosuppressants: Secondary | ICD-10-CM | POA: Insufficient documentation

## 2023-07-08 DIAGNOSIS — Z79899 Other long term (current) drug therapy: Secondary | ICD-10-CM | POA: Insufficient documentation

## 2023-07-08 LAB — CBC WITH DIFFERENTIAL (CANCER CENTER ONLY)
Abs Immature Granulocytes: 0.02 10*3/uL (ref 0.00–0.07)
Basophils Absolute: 0 10*3/uL (ref 0.0–0.1)
Basophils Relative: 0 %
Eosinophils Absolute: 0 10*3/uL (ref 0.0–0.5)
Eosinophils Relative: 1 %
HCT: 45.2 % (ref 39.0–52.0)
Hemoglobin: 13.8 g/dL (ref 13.0–17.0)
Immature Granulocytes: 1 %
Lymphocytes Relative: 46 %
Lymphs Abs: 1.3 10*3/uL (ref 0.7–4.0)
MCH: 25.4 pg — ABNORMAL LOW (ref 26.0–34.0)
MCHC: 30.5 g/dL (ref 30.0–36.0)
MCV: 83.1 fL (ref 80.0–100.0)
Monocytes Absolute: 0.3 10*3/uL (ref 0.1–1.0)
Monocytes Relative: 9 %
Neutro Abs: 1.2 10*3/uL — ABNORMAL LOW (ref 1.7–7.7)
Neutrophils Relative %: 43 %
Platelet Count: 202 10*3/uL (ref 150–400)
RBC: 5.44 MIL/uL (ref 4.22–5.81)
RDW: 19.9 % — ABNORMAL HIGH (ref 11.5–15.5)
WBC Count: 2.8 10*3/uL — ABNORMAL LOW (ref 4.0–10.5)
nRBC: 0 % (ref 0.0–0.2)

## 2023-07-08 LAB — CMP (CANCER CENTER ONLY)
ALT: 21 U/L (ref 0–44)
AST: 24 U/L (ref 15–41)
Albumin: 4.5 g/dL (ref 3.5–5.0)
Alkaline Phosphatase: 88 U/L (ref 38–126)
Anion gap: 8 (ref 5–15)
BUN: 18 mg/dL (ref 6–20)
CO2: 26 mmol/L (ref 22–32)
Calcium: 9.3 mg/dL (ref 8.9–10.3)
Chloride: 103 mmol/L (ref 98–111)
Creatinine: 0.9 mg/dL (ref 0.61–1.24)
GFR, Estimated: >60 mL/min
Glucose, Bld: 110 mg/dL — ABNORMAL HIGH (ref 70–99)
Potassium: 3.9 mmol/L (ref 3.5–5.1)
Sodium: 137 mmol/L (ref 135–145)
Total Bilirubin: 1.1 mg/dL (ref 0.0–1.2)
Total Protein: 7.3 g/dL (ref 6.5–8.1)

## 2023-07-08 LAB — PHOSPHORUS: Phosphorus: 4.1 mg/dL (ref 2.5–4.6)

## 2023-07-08 LAB — MAGNESIUM: Magnesium: 2 mg/dL (ref 1.7–2.4)

## 2023-07-08 LAB — URIC ACID: Uric Acid, Serum: 3 mg/dL — ABNORMAL LOW (ref 3.7–8.6)

## 2023-07-08 LAB — LACTATE DEHYDROGENASE: LDH: 159 U/L (ref 98–192)

## 2023-07-08 MED ORDER — MONTELUKAST SODIUM 10 MG PO TABS
10.0000 mg | ORAL_TABLET | Freq: Once | ORAL | Status: AC
  Administered 2023-07-08: 10 mg via ORAL
  Filled 2023-07-08: qty 1

## 2023-07-08 MED ORDER — SODIUM CHLORIDE 0.9 % IV SOLN
INTRAVENOUS | Status: DC
  Filled 2023-07-08: qty 250

## 2023-07-08 MED ORDER — DIPHENHYDRAMINE HCL 50 MG/ML IJ SOLN
50.0000 mg | Freq: Once | INTRAMUSCULAR | Status: AC
  Administered 2023-07-08: 50 mg via INTRAVENOUS
  Filled 2023-07-08: qty 1

## 2023-07-08 MED ORDER — ACETAMINOPHEN 325 MG PO TABS
650.0000 mg | ORAL_TABLET | Freq: Once | ORAL | Status: AC
  Administered 2023-07-08: 650 mg via ORAL
  Filled 2023-07-08: qty 2

## 2023-07-08 MED ORDER — SODIUM CHLORIDE 0.9% FLUSH
10.0000 mL | INTRAVENOUS | Status: DC | PRN
  Administered 2023-07-08: 10 mL
  Filled 2023-07-08: qty 10

## 2023-07-08 MED ORDER — SODIUM CHLORIDE 0.9 % IV SOLN
1000.0000 mg | Freq: Once | INTRAVENOUS | Status: AC
  Administered 2023-07-08: 1000 mg via INTRAVENOUS
  Filled 2023-07-08: qty 40

## 2023-07-08 MED ORDER — FAMOTIDINE IN NACL 20-0.9 MG/50ML-% IV SOLN
20.0000 mg | Freq: Once | INTRAVENOUS | Status: AC
  Administered 2023-07-08: 20 mg via INTRAVENOUS
  Filled 2023-07-08: qty 50

## 2023-07-08 MED ORDER — SODIUM CHLORIDE 0.9 % IV SOLN
20.0000 mg | Freq: Once | INTRAVENOUS | Status: AC
  Administered 2023-07-08: 20 mg via INTRAVENOUS
  Filled 2023-07-08: qty 20

## 2023-07-08 NOTE — Patient Instructions (Signed)
 [facility] - A DEPT OF . [facility]  Discharge Instructions: Thank you for choosing [facility] to provide your oncology and hematology care.  If you have a lab appointment with the Cancer Center, please go directly to the Cancer Center and check in at the registration area.  Wear comfortable clothing and clothing appropriate for easy access to any Portacath or PICC line.   We strive to give you quality time with your provider. You may need to reschedule your appointment if you arrive late (15 or more minutes).  Arriving late affects you and other patients whose appointments are after yours.  Also, if you miss three or more appointments without notifying the office, you may be dismissed from the clinic at the provider's discretion.      For prescription refill requests, have your pharmacy contact our office and allow 72 hours for refills to be completed.    Today you received the following chemotherapy and/or immunotherapy agents :GAZYVA    To help prevent nausea and vomiting after your treatment, we encourage you to take your nausea medication as directed.  BELOW ARE SYMPTOMS THAT SHOULD BE REPORTED IMMEDIATELY: *FEVER GREATER THAN 100.4 F (38 C) OR HIGHER *CHILLS OR SWEATING *NAUSEA AND VOMITING THAT IS NOT CONTROLLED WITH YOUR NAUSEA MEDICATION *UNUSUAL SHORTNESS OF BREATH *UNUSUAL BRUISING OR BLEEDING *URINARY PROBLEMS (pain or burning when urinating, or frequent urination) *BOWEL PROBLEMS (unusual diarrhea, constipation, pain near the anus) TENDERNESS IN MOUTH AND THROAT WITH OR WITHOUT PRESENCE OF ULCERS (sore throat, sores in mouth, or a toothache) UNUSUAL RASH, SWELLING OR PAIN  UNUSUAL VAGINAL DISCHARGE OR ITCHING   Items with * indicate a potential emergency and should be followed up as soon as possible or go to the Emergency Department if any problems should occur.  Please show the CHEMOTHERAPY ALERT CARD or IMMUNOTHERAPY ALERT  CARD at check-in to the Emergency Department and triage nurse.  Should you have questions after your visit or need to cancel or reschedule your appointment, please contact [facility] - A DEPT OF [facility]  [phone] and follow the prompts.  Office hours are 8:00 a.m. to 4:30 p.m. Monday - Friday. Please note that voicemails left after 4:00 p.m. may not be returned until the following business day.  We are closed weekends and major holidays. You have access to a nurse at all times for urgent questions. Please call the main number to the clinic [phone] and follow the prompts.  For any non-urgent questions, you may also contact your provider using MyChart. We now offer e-Visits for anyone 110 and older to request care online for non-urgent symptoms. For details visit [email or web address].   Also download the MyChart app! Go to the app store, search MyChart, open the app, select [place], and log in with your MyChart username and password.

## 2023-07-08 NOTE — Assessment & Plan Note (Addendum)
#  CLL-CD38 positive stage III [retroperitoneal lymph nodes]-Asymptomatic. IGVH- UNMUTATED; JULY  FISH panel- 12 trisomy-   DEC 18th, 2024-  up to 17 mm- Stable lymphadenopathy involving the chest, abdomen and pelvis. enlargement of the spleen. Currently fixed duration therapy- Gazyva - started Venatoclax with cycle #2]  # Proceed with Gazyva  cycle #6. Labs-CBC/chemistries- ANC 1.2  were reviewed with the patient. -CONTINUE  venetoclax  300mg /day] today- if worsening could consider 3 weeks ON and 1 week OFF-  Discussed with pharmacy.  Will plan imaging after 6 cycles of therapy- ordered today.  Discussed with the patient will be on venetoclax  for total of 1 year.   # Gazyva  infusion- G-1-2 resolved.   #History of urinary retention /prostatomegaly /prostate cancer - on surveiilaince [Dr.Brandon]incidental-  2.1 x 0.9 cm stone or calcified mucosal lesion posterior left bladder wall in the region of the left UVJ.  Dr.Brandon.  stable.    # CKD stage II-III- stable.      # MS clinically   stable.     # Secondary cancers: colo-[march 2024; [place]]; recommend surveillaince with dermatology-   stable  # Vaccination: discussed re: Flu shots; pneumonia vaccination [s/p Shigles vacciation- in past]- stable.   # Hx of Gout Elevated uric acid 9.5-second of CLL-on allopurinol  increased dose to 300 BID-    stable.  # ID prophylaxis: continue Bactrim  DS; and Acyclovir -   stable.  # ACP [05/13/2023] Full code.  # IV access: PIV   PS # DISPOSITION: # proceed Gazyva  today-  # follow up in 4  weeks- MD; labs- cbc/cmp; LDH;  mag; phos;uric acid';NO chemo- ; CT CAP-  Dr.B

## 2023-07-08 NOTE — Progress Notes (Signed)
 No concerns today

## 2023-07-08 NOTE — Progress Notes (Signed)
 [facility] CONSULT NOTE  Patient Care Team: [name], MD as PCP - General (Family Medicine) [name], MD as Consulting Physician (Oncology)  CHIEF COMPLAINTS/PURPOSE OF CONSULTATION: CLL  Oncology History Overview Note  Chronic lymphocytic leukemia (CLL), positive for CD20, CD22, CD19 and  CD38,  see comment.   ANNOTATION COMMENT IMP Comment VC   Comment: (NOTE)  Expression of CD38 on >30% of the clonal B-cells is reported to be an  unfavorable prognostic factor in CLL.     # CLL stage I- [lymphocytosis/retroperitoneal lymphadenopathy up to 2.5 cm-incidental CT protocol- 2023];   APRIL 2024- consistent with trisomy 12. Results for  CCND1/IGH, ATM, 13q and TP53 were normal.   # JAN 14th, 2024- Gazyva +  [ started Venatoclax with cycle #2]  with cycle # 4- Re-start at venetoclax  300mg /day [ANC 0.6- late APRIL 2025]  #GEN 2023-bilateral severe hydronephrosis-bladder outlet obstruction/status post catheter [Dr.Brandon]  #History of prostate cancer under surveillance [[facility]]   CLL (chronic lymphocytic leukemia) (HCC)  02/11/2021 Initial Diagnosis   CLL (chronic lymphocytic leukemia) (HCC)   01/20/2023 -  Chemotherapy   Patient is on Treatment Plan : LYMPHOMA CLL/SLL Venetoclax  + Obinutuzumab  q28d      HISTORY OF PRESENTING ILLNESS: Alone.  Ambulating independently.  Ralph [name] 58 y.o.  male history of multiple sclerosis-not on active therapy; prostate cancer on surveillance; CLL currently on Gazyva  [ started Venatoclax with cycle #2] is here for a follow-up.  Patient denies any fevers or chills. Patient is doing well.  Appetite is reported as good.  Gained weight. Denies any lumps or bumps.   Denies nausea. Bowels normal. No fevers at home. No pain. No diarrhea. Denies neuropathy.     Review of Systems  Constitutional:  Negative for chills, diaphoresis, fever and malaise/fatigue.  HENT:  Negative for nosebleeds and sore throat.   Eyes:   Negative for double vision.  Respiratory:  Negative for cough, hemoptysis, sputum production, shortness of breath and wheezing.   Cardiovascular:  Negative for chest pain, palpitations, orthopnea and leg swelling.  Gastrointestinal:  Negative for abdominal pain, blood in stool, constipation, diarrhea, heartburn, melena, nausea and vomiting.  Genitourinary:  Negative for dysuria, frequency and urgency.  Musculoskeletal:  Negative for back pain and joint pain.  Skin: Negative.  Negative for itching and rash.  Neurological:  Negative for dizziness, tingling, focal weakness, weakness and headaches.  Endo/Heme/Allergies:  Does not bruise/bleed easily.  Psychiatric/Behavioral:  Negative for depression. The patient is not nervous/anxious and does not have insomnia.      MEDICAL HISTORY:  Past Medical History:  Diagnosis Date   Acute renal failure (HCC)    Anxiety    Bladder outlet obstruction    CLL (chronic lymphocytic leukemia) (HCC)    Elevated PSA    Foreign body of bladder    MS (multiple sclerosis) (HCC)    Pre-diabetes    Prostate cancer (HCC) 2013   UTI (urinary tract infection)     SURGICAL HISTORY: Past Surgical History:  Procedure Laterality Date   CYSTOSCOPY N/A 12/16/2021   Procedure: CYSTOSCOPY WITH REMOVAL OF FOREIGN BODY;  Surgeon: Penne Knee, MD;  Location: [facility] ORS;  Service: Urology;  Laterality: N/A;   HOLEP-LASER ENUCLEATION OF THE PROSTATE WITH MORCELLATION N/A 03/11/2021   Procedure: HOLEP-LASER ENUCLEATION OF THE PROSTATE WITH MORCELLATION;  Surgeon: Penne Knee, MD;  Location: [facility];  Service: Urology;  Laterality: N/A;   KNEE SURGERY  2002   meniscus   TRANSURETHRAL RESECTION  OF PROSTATE N/A 12/16/2021   Procedure: TRANSURETHRAL RESECTION OF THE PROSTATE (TURP);  Surgeon: Penne Knee, MD;  Location: [facility] ORS;  Service: Urology;  Laterality: N/A;    SOCIAL HISTORY: Social History   Socioeconomic History   Marital status: Single    Spouse  name: Not on file   Number of children: 0   Years of education: College   Highest education level: Not on file  Occupational History   Occupation: Research officer, 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  Denies any alcohol; lives in [place].    Social Drivers of Health   Financial Resource Strain: Low Risk  (08/27/2022)   Received from [facility]   Overall Financial Resource Strain (CARDIA)    Difficulty of Paying Living Expenses: Not hard at all  Food Insecurity: No Food Insecurity (08/27/2022)   Received from [facility]   Hunger Vital Sign    Within the past 12 months, you worried that your food would run out before you got the money to buy more.: Never true    Within the past 12 months, the food you bought just didn't last and you didn't have money to get more.: Never true  Transportation Needs: No Transportation Needs (08/27/2022)   Received from [facility] - Transportation    In the past 12 months, has lack of transportation kept you from medical appointments or from getting medications?: No    Lack of Transportation (Non-Medical): No  Physical Activity: Not on file  Stress: Not on file  Social Connections: Not on file  Intimate Partner Violence: Not on file    FAMILY HISTORY: Family History  Problem Relation Age of Onset   Cancer Sister        breast CA     ALLERGIES:  is allergic to gazyva  [obinutuzumab ].  MEDICATIONS:  Current Outpatient Medications  Medication Sig Dispense Refill   acyclovir   (ZOVIRAX ) 400 MG tablet Take 1 tablet (400 mg total) by mouth 2 (two) times daily. 60 tablet 4   allopurinol  (ZYLOPRIM ) 300 MG tablet Take 1 tablet (300 mg total) by mouth 2 (two) times daily. 60 tablet 4   diphenhydrAMINE  (BENADRYL ) 25 mg capsule Take 25 mg by mouth every 6 (six) hours as needed for itching.     rosuvastatin  (CRESTOR ) 5 MG tablet Take 1 tablet by mouth at bedtime.     sulfamethoxazole -trimethoprim  (BACTRIM  DS) 800-160 MG tablet One pill on Monday;wed;Friday 30 tablet 1   venetoclax  (VENCLEXTA ) 100 MG tablet Take 3 tablets (300 mg total) by mouth daily. Tablets should be swallowed whole with a meal and a full glass of water. 84 tablet 0   Multiple Vitamin (MULTIVITAMIN) tablet Take 1 tablet by mouth daily. (Patient not taking: Reported on 07/08/2023)     ondansetron  (ZOFRAN ) 8 MG tablet One pill every 8 hours as needed for nausea/vomitting. (Patient not taking: Reported on 07/08/2023) 40 tablet 1   prochlorperazine  (COMPAZINE ) 10  MG tablet Take 1 tablet (10 mg total) by mouth every 6 (six) hours as needed for nausea or vomiting. (Patient not taking: Reported on 07/08/2023) 40 tablet 1   No current facility-administered medications for this visit.   Facility-Administered Medications Ordered in Other Visits  Medication Dose Route Frequency Provider Last Rate Last Admin   0.9 %  sodium chloride  infusion   Intravenous Continuous Rennie, Camira Geidel R, MD       acetaminophen  (TYLENOL ) tablet 650 mg  650 mg Oral Once Amare Bail R, MD       dexamethasone  (DECADRON ) 20 mg in sodium chloride  0.9 % 50 mL IVPB  20 mg Intravenous Once Makaylin Carlo R, MD       diphenhydrAMINE  (BENADRYL ) injection 50 mg  50 mg Intravenous Once Keyonte Cookston R, MD       famotidine  (PEPCID ) IVPB 20 mg premix  20 mg Intravenous Once Tiffanee Mcnee R, MD       montelukast  (SINGULAIR ) tablet 10 mg  10 mg Oral Once Dailen Mcclish R, MD       obinutuzumab  (GAZYVA ) 1,000 mg in sodium chloride   0.9 % 250 mL (3.4483 mg/mL) chemo infusion  1,000 mg Intravenous Once Mohsen Odenthal R, MD       sodium chloride  flush (NS) 0.9 % injection 10 mL  10 mL Intracatheter PRN Alayja Armas R, MD        PHYSICAL EXAMINATION:  Vitals:   07/08/23 0815  BP: 121/80  Pulse: 70  Resp: 16  Temp: 97.6 F (36.4 C)  SpO2: 99%     Filed Weights   07/08/23 0815  Weight: 217 lb 6.4 oz (98.6 kg)   At Baseline- Shotty bilateral neck adenopathy.  1 to 2 cm lymph node bilateral axilla left more than right.  Mild lymphadenopathy in the right inguinal region  3/12- resolution of lymphadenopathy  Physical Exam Vitals and nursing note reviewed.  HENT:     Head: Normocephalic and atraumatic.     Mouth/Throat:     Pharynx: Oropharynx is clear.  Eyes:     Extraocular Movements: Extraocular movements intact.     Pupils: Pupils are equal, round, and reactive to light.  Cardiovascular:     Rate and Rhythm: Normal rate and regular rhythm.  Pulmonary:     Comments: Decreased breath sounds bilaterally.  Abdominal:     Palpations: Abdomen is soft.  Musculoskeletal:        General: Normal range of motion.     Cervical back: Normal range of motion.  Skin:    General: Skin is warm.  Neurological:     General: No focal deficit present.     Mental Status: He is alert and oriented to person, place, and time.  Psychiatric:        Behavior: Behavior normal.        Judgment: Judgment normal.     LABORATORY DATA:  I have reviewed the data as listed Lab Results  Component Value Date   WBC 2.8 (L) 07/08/2023   HGB 13.8 07/08/2023   HCT 45.2 07/08/2023   MCV 83.1 07/08/2023   PLT 202 07/08/2023   Recent Labs    05/13/23 0804 06/10/23 0753 07/08/23 0816  NA 140 137 137  K 3.9 3.9 3.9  CL 104 103 103  CO2 27 25 26   GLUCOSE 108* 101* 110*  BUN 20 22* 18  CREATININE 0.93 1.00 0.90  CALCIUM  9.3 9.1 9.3  GFRNONAA >60 >60 >60  PROT 7.1 7.1  7.3  ALBUMIN 4.5 4.4 4.5  AST 24 23 24    ALT 20 19 21   ALKPHOS 68 85 88  BILITOT 0.5 1.0 1.1    RADIOGRAPHIC STUDIES: I have personally reviewed the radiological images as listed and agreed with the findings in the report. No results found.    CLL (chronic lymphocytic leukemia) (HCC) #CLL-CD38 positive stage III [retroperitoneal lymph nodes]-Asymptomatic. IGVH- UNMUTATED; JULY  FISH panel- 12 trisomy-   DEC 18th, 2024-  up to 17 mm- Stable lymphadenopathy involving the chest, abdomen and pelvis. enlargement of the spleen. Currently fixed duration therapy- Gazyva - started Venatoclax with cycle #2]  # Proceed with Gazyva  cycle #6. Labs-CBC/chemistries- ANC 1.2  were reviewed with the patient. -CONTINUE  venetoclax  300mg /day] today- if worsening could consider 3 weeks ON and 1 week OFF-  Discussed with pharmacy.  Will plan imaging after 6 cycles of therapy- ordered today.  Discussed with the patient will be on venetoclax  for total of 1 year.   # Gazyva  infusion- G-1-2 resolved.   #History of urinary retention /prostatomegaly /prostate cancer - on surveiilaince [Dr.Brandon]incidental-  2.1 x 0.9 cm stone or calcified mucosal lesion posterior left bladder wall in the region of the left UVJ.  Dr.Brandon.  stable.    # CKD stage II-III- stable.      # MS clinically   stable.     # Secondary cancers: colo-[march 2024; [place]]; recommend surveillaince with dermatology-   stable  # Vaccination: discussed re: Flu shots; pneumonia vaccination [s/p Shigles vacciation- in past]- stable.   # Hx of Gout Elevated uric acid 9.5-second of CLL-on allopurinol  increased dose to 300 BID-    stable.  # ID prophylaxis: continue Bactrim  DS; and Acyclovir -   stable.  # ACP [05/13/2023] Full code.  # IV access: PIV   PS # DISPOSITION: # proceed Gazyva  today-  # follow up in 4  weeks- MD; labs- cbc/cmp; LDH;  mag; phos;uric acid';NO chemo- ; CT CAP-  Dr.B  All questions were answered. The patient knows to call the clinic with any problems,  questions or concerns.     Cindy JONELLE Joe, MD 07/08/2023 9:12 AM

## 2023-07-09 ENCOUNTER — Encounter (HOSPITAL_COMMUNITY): Payer: Self-pay

## 2023-07-13 ENCOUNTER — Other Ambulatory Visit: Payer: Self-pay

## 2023-07-13 ENCOUNTER — Ambulatory Visit
Admission: RE | Admit: 2023-07-13 | Discharge: 2023-07-13 | Disposition: A | Discharge status: Home / Self Care | Source: Ambulatory Visit | Attending: Internal Medicine

## 2023-07-13 ENCOUNTER — Other Ambulatory Visit: Payer: Self-pay | Admitting: Internal Medicine

## 2023-07-13 DIAGNOSIS — K5730 Diverticulosis of large intestine without perforation or abscess without bleeding: Secondary | ICD-10-CM | POA: Diagnosis not present

## 2023-07-13 DIAGNOSIS — C9110 Chronic lymphocytic leukemia of B-cell type not having achieved remission: Secondary | ICD-10-CM

## 2023-07-13 DIAGNOSIS — R590 Localized enlarged lymph nodes: Secondary | ICD-10-CM | POA: Diagnosis not present

## 2023-07-13 DIAGNOSIS — R161 Splenomegaly, not elsewhere classified: Secondary | ICD-10-CM | POA: Diagnosis not present

## 2023-07-13 MED ORDER — IOPAMIDOL (ISOVUE-300) INJECTION 61%
100.0000 mL | Freq: Once | INTRAVENOUS | Status: AC | PRN
  Administered 2023-07-13: 100 mL via INTRAVENOUS

## 2023-07-13 NOTE — Progress Notes (Signed)
 Specialty Pharmacy Refill Coordination Note  Ralph [name] is a 58 y.o. male contacted today regarding refills of specialty medication(s) Venetoclax  (VENCLEXTA )   Patient requested [name] at [facility] at [place] date: 07/17/23   Medication will be filled on 07/17/23.   This fill date is pending response to refill request from provider. Patient is aware and if they have not received fill by intended date they must follow up with pharmacy.

## 2023-07-14 ENCOUNTER — Encounter: Payer: Self-pay | Admitting: Internal Medicine

## 2023-07-14 ENCOUNTER — Other Ambulatory Visit: Payer: Self-pay

## 2023-07-14 ENCOUNTER — Other Ambulatory Visit (HOSPITAL_COMMUNITY): Payer: Self-pay

## 2023-07-14 MED ORDER — VENETOCLAX 100 MG PO TABS
300.0000 mg | ORAL_TABLET | Freq: Every day | ORAL | 0 refills | Status: DC
Start: 2023-07-14 — End: 2023-08-07
  Filled 2023-07-14 – 2023-07-16 (×2): qty 84, 28d supply, fill #0

## 2023-07-15 ENCOUNTER — Encounter (INDEPENDENT_AMBULATORY_CARE_PROVIDER_SITE_OTHER): Payer: Self-pay

## 2023-07-15 ENCOUNTER — Other Ambulatory Visit: Payer: Self-pay

## 2023-07-16 ENCOUNTER — Other Ambulatory Visit (HOSPITAL_COMMUNITY): Payer: Self-pay

## 2023-07-16 ENCOUNTER — Other Ambulatory Visit: Payer: Self-pay

## 2023-07-21 ENCOUNTER — Other Ambulatory Visit: Payer: Self-pay | Admitting: *Deleted

## 2023-07-21 DIAGNOSIS — C61 Malignant neoplasm of prostate: Secondary | ICD-10-CM

## 2023-07-27 DIAGNOSIS — H43811 Vitreous degeneration, right eye: Secondary | ICD-10-CM | POA: Diagnosis not present

## 2023-07-29 ENCOUNTER — Other Ambulatory Visit: Payer: Self-pay

## 2023-07-29 DIAGNOSIS — C61 Malignant neoplasm of prostate: Secondary | ICD-10-CM

## 2023-07-30 LAB — PSA: Prostate Specific Ag, Serum: 1.3 ng/mL (ref 0.0–4.0)

## 2023-08-05 ENCOUNTER — Ambulatory Visit: Admitting: Internal Medicine

## 2023-08-05 ENCOUNTER — Other Ambulatory Visit

## 2023-08-05 ENCOUNTER — Ambulatory Visit: Payer: Self-pay | Admitting: Urology

## 2023-08-05 ENCOUNTER — Other Ambulatory Visit: Payer: Self-pay

## 2023-08-06 ENCOUNTER — Other Ambulatory Visit: Payer: Self-pay

## 2023-08-07 ENCOUNTER — Other Ambulatory Visit: Payer: Self-pay | Admitting: Pharmacy Technician

## 2023-08-07 ENCOUNTER — Other Ambulatory Visit: Payer: Self-pay

## 2023-08-07 ENCOUNTER — Other Ambulatory Visit: Payer: Self-pay | Admitting: Internal Medicine

## 2023-08-07 DIAGNOSIS — C9110 Chronic lymphocytic leukemia of B-cell type not having achieved remission: Secondary | ICD-10-CM

## 2023-08-07 NOTE — Progress Notes (Signed)
 Specialty Pharmacy Ongoing Clinical Assessment Note  Ralph [name] is a 58 y.o. male who is being followed by the specialty pharmacy service for RxSp Oncology   Patient's specialty medication(s) reviewed today: Venetoclax  (VENCLEXTA )   Missed doses in the last 4 weeks: 0   Patient/Caregiver did not have any additional questions or concerns.   Therapeutic benefit summary: Patient is achieving benefit   Adverse events/side effects summary: No adverse events/side effects   Patient's therapy is appropriate to: Continue    Goals Addressed             This Visit's Progress    Achieve or maintain remission   On track    Patient is on track. Patient will maintain adherence and be monitored by provider to determine if a change in treatment plan is warranted.           Follow up: 3 months  [facility]

## 2023-08-07 NOTE — Progress Notes (Signed)
 Specialty Pharmacy Refill Coordination Note  Ralph [name] is a 58 y.o. male contacted today regarding refills of specialty medication(s) Venetoclax  (VENCLEXTA )   Patient requested [name] at [facility] at [place] date: 08/10/23   Medication will be filled on 08/07/23 or when MD approve refill.   This fill date is pending response to refill request from provider. Patient is aware and if they have not received fill by intended date they must follow up with pharmacy.

## 2023-08-09 ENCOUNTER — Encounter: Payer: Self-pay | Admitting: Internal Medicine

## 2023-08-09 MED ORDER — VENETOCLAX 100 MG PO TABS
300.0000 mg | ORAL_TABLET | Freq: Every day | ORAL | 0 refills | Status: DC
  Filled 2023-08-10: qty 84, 28d supply, fill #0

## 2023-08-10 ENCOUNTER — Other Ambulatory Visit (HOSPITAL_COMMUNITY): Payer: Self-pay

## 2023-08-10 ENCOUNTER — Other Ambulatory Visit: Payer: Self-pay

## 2023-08-12 ENCOUNTER — Ambulatory Visit (INDEPENDENT_AMBULATORY_CARE_PROVIDER_SITE_OTHER): Admitting: Physician Assistant

## 2023-08-12 ENCOUNTER — Ambulatory Visit: Payer: Federal, State, Local not specified - PPO | Admitting: Urology

## 2023-08-12 VITALS — BP 128/76 | HR 77 | Ht 72.0 in | Wt 223.2 lb

## 2023-08-12 DIAGNOSIS — C61 Malignant neoplasm of prostate: Secondary | ICD-10-CM

## 2023-08-12 DIAGNOSIS — N138 Other obstructive and reflux uropathy: Secondary | ICD-10-CM | POA: Diagnosis not present

## 2023-08-12 DIAGNOSIS — N401 Enlarged prostate with lower urinary tract symptoms: Secondary | ICD-10-CM | POA: Diagnosis not present

## 2023-08-12 LAB — BLADDER SCAN AMB NON-IMAGING

## 2023-08-12 NOTE — Progress Notes (Signed)
 08/12/2023 2:23 PM   [facility] [birth year] [id number]  CC: Chief Complaint  Patient presents with   Follow-up   HPI: Ralph [name] is a 58 y.o. male with PMH BPH with urinary retention s/p HOLEP in 2023, CLL managed by Dr. Rennie, MS, and low risk prostate cancer on active surveillance who presents today for annual follow-up.   Today he reports he is doing very well with no acute concerns.  If anything, his urinary symptoms have improved this year.  He is having no more groin pain.  PSA slightly up this year, 1.3.  Previously 0.7 a year ago.  IPSS 9/pleased as below.  PVR 100 mL.  Not on BPH pharmacotherapy.   IPSS     Row Name 08/12/23 1400         International Prostate Symptom Score   How often have you had the sensation of not emptying your bladder? Less than 1 in 5     How often have you had to urinate less than every two hours? Less than 1 in 5 times     How often have you found you stopped and started again several times when you urinated? Less than 1 in 5 times     How often have you found it difficult to postpone urination? Less than 1 in 5 times     How often have you had a weak urinary stream? Less than half the time     How often have you had to strain to start urination? Less than 1 in 5 times     How many times did you typically get up at night to urinate? 2 Times     Total IPSS Score 9       Quality of Life due to urinary symptoms   If you were to spend the rest of your life with your urinary condition just the way it is now how would you feel about that? Pleased         PMH: Past Medical History:  Diagnosis Date   Acute renal failure (HCC)    Anxiety    Bladder outlet obstruction    CLL (chronic lymphocytic leukemia) (HCC)    Elevated PSA    Foreign body of bladder    MS (multiple sclerosis) (HCC)    Pre-diabetes    Prostate cancer (HCC) 2013   UTI (urinary tract infection)     Surgical History: Past Surgical History:   Procedure Laterality Date   CYSTOSCOPY N/A 12/16/2021   Procedure: CYSTOSCOPY WITH REMOVAL OF FOREIGN BODY;  Surgeon: Penne Knee, MD;  Location: [facility];  Service: Urology;  Laterality: N/A;   HOLEP-LASER ENUCLEATION OF THE PROSTATE WITH MORCELLATION N/A 03/11/2021   Procedure: HOLEP-LASER ENUCLEATION OF THE PROSTATE WITH MORCELLATION;  Surgeon: Penne Knee, MD;  Location: [facility];  Service: Urology;  Laterality: N/A;   KNEE SURGERY  2002   meniscus   TRANSURETHRAL RESECTION OF PROSTATE N/A 12/16/2021   Procedure: TRANSURETHRAL RESECTION OF THE PROSTATE (TURP);  Surgeon: Penne Knee, MD;  Location: [facility] OR[facility];  Service: Urology;  Laterality: N/A;    Home Medications:  Allergies as of 08/12/2023       Reactions   Gazyva  [obinutuzumab ] Other (See Comments)   Nausea, diaphoresis, feeling hot and became febrile.  Pt able to complete infusion.        Medication List        Accurate as of August 12, 2023  2:23 PM. If you have any  questions, ask your nurse or doctor.          acyclovir  400 MG tablet Commonly known as: ZOVIRAX  Take 1 tablet (400 mg total) by mouth 2 (two) times daily.   allopurinol  300 MG tablet Commonly known as: ZYLOPRIM  Take 1 tablet (300 mg total) by mouth 2 (two) times daily.   diphenhydrAMINE  25 mg capsule Commonly known as: BENADRYL  Take 25 mg by mouth every 6 (six) hours as needed for itching.   multivitamin tablet Take 1 tablet by mouth daily.   ondansetron  8 MG tablet Commonly known as: ZOFRAN  One pill every 8 hours as needed for nausea/vomitting.   prochlorperazine  10 MG tablet Commonly known as: COMPAZINE  Take 1 tablet (10 mg total) by mouth every 6 (six) hours as needed for nausea or vomiting.   rosuvastatin  5 MG tablet Commonly known as: CRESTOR  Take 1 tablet by mouth at bedtime.   sulfamethoxazole -trimethoprim  800-160 MG tablet Commonly known as: BACTRIM  DS One pill on Monday;wed;Friday   Venclexta  100 MG  tablet Generic drug: venetoclax  Take 3 tablets (300 mg total) by mouth daily. Tablets should be swallowed whole with a meal and a full glass of water.        Allergies:  Allergies  Allergen Reactions   Gazyva  [Obinutuzumab ] Other (See Comments)    Nausea, diaphoresis, feeling hot and became febrile.  Pt able to complete infusion.    Family History: Family History  Problem Relation Age of Onset   Cancer Sister        breast CA     Social History:   reports that he has never smoked. He has never been exposed to tobacco smoke. He has never used smokeless tobacco. He reports that he does not drink alcohol and does not use drugs.  Physical Exam: There were no vitals taken for this visit.  Constitutional:  Alert and oriented, no acute distress, nontoxic appearing HEENT: , AT Cardiovascular: No clubbing, cyanosis, or edema Respiratory: Normal respiratory effort, no increased work of breathing Skin: No rashes, bruises or suspicious lesions Neurologic: Grossly intact, no focal deficits, moving all 4 extremities Psychiatric: Normal mood and affect  Laboratory Data: Results for orders placed or performed in visit on 07/29/23  PSA   Collection Time: 07/29/23  8:39 AM  Result Value Ref Range   Prostate Specific Ag, Serum 1.3 0.0 - 4.0 ng/mL   Assessment & Plan:   1. BPH with urinary obstruction (Primary) Emptying appropriately off pharmacotherapy s/p HOLEP.  Will continue to monitor. - Bladder Scan (Post Void Residual) in office  2. Prostate cancer (HCC) PSA is up slightly this year, though velocity is not terribly concerning.  That said, since he does have known low risk prostate cancer, I recommended a prostate MRI for further evaluation.  He is in agreement with this plan.  He denies claustrophobia or metal/implants in his body. - MR PROSTATE W WO CONTRAST; Future   Return for Will call with results.  Lucie Hones, PA-C  [facility] [place] [address] [place], [place] [address] [phone]

## 2023-08-14 ENCOUNTER — Other Ambulatory Visit (HOSPITAL_COMMUNITY): Payer: Self-pay

## 2023-08-17 ENCOUNTER — Inpatient Hospital Stay (HOSPITAL_BASED_OUTPATIENT_CLINIC_OR_DEPARTMENT_OTHER): Admitting: Internal Medicine

## 2023-08-17 ENCOUNTER — Inpatient Hospital Stay: Attending: Internal Medicine

## 2023-08-17 ENCOUNTER — Encounter: Payer: Self-pay | Admitting: Internal Medicine

## 2023-08-17 VITALS — BP 123/79 | HR 70 | Temp 98.4°F | Resp 16 | Ht 72.0 in | Wt 219.1 lb

## 2023-08-17 DIAGNOSIS — Z803 Family history of malignant neoplasm of breast: Secondary | ICD-10-CM | POA: Insufficient documentation

## 2023-08-17 DIAGNOSIS — Z79899 Other long term (current) drug therapy: Secondary | ICD-10-CM | POA: Insufficient documentation

## 2023-08-17 DIAGNOSIS — Z79624 Long term (current) use of inhibitors of nucleotide synthesis: Secondary | ICD-10-CM | POA: Diagnosis not present

## 2023-08-17 DIAGNOSIS — C9110 Chronic lymphocytic leukemia of B-cell type not having achieved remission: Secondary | ICD-10-CM | POA: Insufficient documentation

## 2023-08-17 DIAGNOSIS — Z7969 Long term (current) use of other immunomodulators and immunosuppressants: Secondary | ICD-10-CM | POA: Diagnosis not present

## 2023-08-17 DIAGNOSIS — G35 Multiple sclerosis: Secondary | ICD-10-CM | POA: Diagnosis not present

## 2023-08-17 LAB — CBC WITH DIFFERENTIAL (CANCER CENTER ONLY)
Abs Immature Granulocytes: 0.15 K/uL — ABNORMAL HIGH (ref 0.00–0.07)
Basophils Absolute: 0 K/uL (ref 0.0–0.1)
Basophils Relative: 0 %
Eosinophils Absolute: 0 K/uL (ref 0.0–0.5)
Eosinophils Relative: 0 %
HCT: 42.6 % (ref 39.0–52.0)
Hemoglobin: 13.8 g/dL (ref 13.0–17.0)
Immature Granulocytes: 5 %
Lymphocytes Relative: 40 %
Lymphs Abs: 1.3 K/uL (ref 0.7–4.0)
MCH: 27.4 pg (ref 26.0–34.0)
MCHC: 32.4 g/dL (ref 30.0–36.0)
MCV: 84.7 fL (ref 80.0–100.0)
Monocytes Absolute: 0.4 K/uL (ref 0.1–1.0)
Monocytes Relative: 13 %
Neutro Abs: 1.3 K/uL — ABNORMAL LOW (ref 1.7–7.7)
Neutrophils Relative %: 42 %
Platelet Count: 223 K/uL (ref 150–400)
RBC: 5.03 MIL/uL (ref 4.22–5.81)
RDW: 16.7 % — ABNORMAL HIGH (ref 11.5–15.5)
WBC Count: 3.1 K/uL — ABNORMAL LOW (ref 4.0–10.5)
nRBC: 0 % (ref 0.0–0.2)

## 2023-08-17 LAB — CMP (CANCER CENTER ONLY)
ALT: 20 U/L (ref 0–44)
AST: 23 U/L (ref 15–41)
Albumin: 4.3 g/dL (ref 3.5–5.0)
Alkaline Phosphatase: 88 U/L (ref 38–126)
Anion gap: 7 (ref 5–15)
BUN: 13 mg/dL (ref 6–20)
CO2: 27 mmol/L (ref 22–32)
Calcium: 9.3 mg/dL (ref 8.9–10.3)
Chloride: 105 mmol/L (ref 98–111)
Creatinine: 0.92 mg/dL (ref 0.61–1.24)
GFR, Estimated: >60 mL/min (ref >60)
Glucose, Bld: 97 mg/dL (ref 70–99)
Potassium: 3.8 mmol/L (ref 3.5–5.1)
Sodium: 139 mmol/L (ref 135–145)
Total Bilirubin: 1.1 mg/dL (ref 0.0–1.2)
Total Protein: 7.1 g/dL (ref 6.5–8.1)

## 2023-08-17 LAB — URIC ACID: Uric Acid, Serum: 2.7 mg/dL — ABNORMAL LOW (ref 3.7–8.6)

## 2023-08-17 LAB — MAGNESIUM: Magnesium: 2 mg/dL (ref 1.7–2.4)

## 2023-08-17 LAB — PHOSPHORUS: Phosphorus: 3.9 mg/dL (ref 2.5–4.6)

## 2023-08-17 LAB — LACTATE DEHYDROGENASE: LDH: 160 U/L (ref 98–192)

## 2023-08-17 NOTE — Assessment & Plan Note (Addendum)
#  CLL-CD38 positive stage III [retroperitoneal lymph nodes]-Asymptomatic. IGVH- UNMUTATED; JULY  FISH panel- 12 trisomy-   DEC 18th, 2024-  up to 17 mm- Stable lymphadenopathy involving the chest, abdomen and pelvis. enlargement of the spleen. Currently fixed duration therapy- Gazyva - started Venatoclax with cycle #2]. S/P Gazyva  cycle #6. JULY 7th, 2025- 1. Resolution of axillary mediastinal lymphadenopathy; No new enlarged lymph nodes chest;  Marked interval improvement in splenomegaly;  No evidence of lymphoma in the abdomen pelvis; Resolution of periaortic and iliac lymphadenopathy.  # Labs-CBC/chemistries- ANC 1.3  were reviewed with the patient. -CONTINUE  venetoclax  300mg /day] today- if worsening could consider 3 weeks ON and 1 week OFF-  Discussed with pharmacy.   Discussed with the patient will be on venetoclax  for total of 1 year [last cycle with JAN 2026].   #History of urinary retention /prostatomegaly /prostate cancer - on surveiilaince [Dr.Brandon]incidental-  2.1 x 0.9 cm stone or calcified mucosal lesion posterior left bladder wall in the region of the left UVJ. [[place]]  awaiting MRI  with Urology-     # CKD stage II-III- stable.      # MS clinically - Stable.     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  # ID prophylaxis: continue Bactrim  DS; and Acyclovir -   stable.  # ACP [05/13/2023] Full code.  #Incidental findings on Imaging  CT , 2025:  Large volume stool in colon and rectum.  No obstructing lesion;  Sigmoid diverticulosis without diverticulitis.I reviewed/discussed/counseled the patient.   # IV access: PIV   PS # DISPOSITION:   # follow up in 4  weeks- MD; labs- cbc/cmp; LDH;   ';NO chemo- -  Dr.B

## 2023-08-17 NOTE — Progress Notes (Signed)
 CT CAP 07/13/23.

## 2023-08-17 NOTE — Progress Notes (Signed)
 [facility] CONSULT NOTE  Patient Care Team: Ralph Lenis, MD as PCP - General (Family Medicine) Rennie Ralph SAUNDERS, MD as Consulting Physician (Oncology)  CHIEF COMPLAINTS/PURPOSE OF CONSULTATION: CLL  Oncology History Overview Note  Chronic lymphocytic leukemia (CLL), positive for CD20, CD22, CD19 and  CD38,  see comment.   ANNOTATION COMMENT IMP Comment VC   Comment: (NOTE)  Expression of CD38 on >30% of the clonal B-cells is reported to be an  unfavorable prognostic factor in CLL.     # CLL stage I- [lymphocytosis/retroperitoneal lymphadenopathy up to 2.5 cm-incidental CT protocol- 2023];   APRIL 2024- consistent with trisomy 12. Results for  CCND1/IGH, ATM, 13q and TP53 were normal.   # JAN 14th, 2024- Gazyva +  [ started Venatoclax with cycle #2]  with cycle # 4- Re-start at venetoclax  300mg /day [ANC 0.6- late APRIL 2025]  #GEN 2023-bilateral severe hydronephrosis-bladder outlet obstruction/status post catheter [Dr.Brandon]  #History of prostate cancer under surveillance [[facility]]   CLL (chronic lymphocytic leukemia) (HCC)  02/11/2021 Initial Diagnosis   CLL (chronic lymphocytic leukemia) (HCC)   01/20/2023 -  Chemotherapy   Patient is on Treatment Plan : LYMPHOMA CLL/SLL Venetoclax  + Obinutuzumab  q28d      HISTORY OF PRESENTING ILLNESS: Alone.  Ambulating independently.  Ralph [name] 58 y.o.  male history of multiple sclerosis-not on active therapy; prostate cancer on surveillance; CLL currently status post 6 cycles of Gazyva  [ started Venatoclax with cycle #2] is here for a follow-up-and to review the results of the CT scan.  Patient denies any fevers or chills. Patient is doing well.  Appetite is reported as good.  Gained weight. Denies any lumps or bumps.   Denies nausea. Bowels normal. No fevers at home. No pain. No diarrhea. Denies neuropathy.     Review of Systems  Constitutional:  Negative for chills, diaphoresis, fever and  malaise/fatigue.  HENT:  Negative for nosebleeds and sore throat.   Eyes:  Negative for double vision.  Respiratory:  Negative for cough, hemoptysis, sputum production, shortness of breath and wheezing.   Cardiovascular:  Negative for chest pain, palpitations, orthopnea and leg swelling.  Gastrointestinal:  Negative for abdominal pain, blood in stool, constipation, diarrhea, heartburn, melena, nausea and vomiting.  Genitourinary:  Negative for dysuria, frequency and urgency.  Musculoskeletal:  Negative for back pain and joint pain.  Skin: Negative.  Negative for itching and rash.  Neurological:  Negative for dizziness, tingling, focal weakness, weakness and headaches.  Endo/Heme/Allergies:  Does not bruise/bleed easily.  Psychiatric/Behavioral:  Negative for depression. The patient is not nervous/anxious and does not have insomnia.      MEDICAL HISTORY:  Past Medical History:  Diagnosis Date   Acute renal failure (HCC)    Anxiety    Bladder outlet obstruction    CLL (chronic lymphocytic leukemia) (HCC)    Elevated PSA    Foreign body of bladder    MS (multiple sclerosis) (HCC)    Pre-diabetes    Prostate cancer (HCC) 2013   UTI (urinary tract infection)     SURGICAL HISTORY: Past Surgical History:  Procedure Laterality Date   CYSTOSCOPY N/A 12/16/2021   Procedure: CYSTOSCOPY WITH REMOVAL OF FOREIGN BODY;  Surgeon: Penne Knee, MD;  Location: [facility];  Service: Urology;  Laterality: N/A;   HOLEP-LASER ENUCLEATION OF THE PROSTATE WITH MORCELLATION N/A 03/11/2021   Procedure: HOLEP-LASER ENUCLEATION OF THE PROSTATE WITH MORCELLATION;  Surgeon: Penne Knee, MD;  Location: [facility];  Service: Urology;  Laterality: N/A;  KNEE SURGERY  2002   meniscus   TRANSURETHRAL RESECTION OF PROSTATE N/A 12/16/2021   Procedure: TRANSURETHRAL RESECTION OF THE PROSTATE (TURP);  Surgeon: Penne Knee, MD;  Location: [facility] OR[facility];  Service: Urology;  Laterality: N/A;    SOCIAL  HISTORY: Social History   Socioeconomic History   Marital status: Single    Spouse name: Not on file   Number of children: 0   Years of education: College   Highest education level: Not on file  Occupational History   Occupation: Research officer, 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  Denies any alcohol; lives in [place].    Social Drivers of Health   Financial Resource Strain: Low Risk  (08/27/2022)   Received from [facility]   Overall Financial Resource Strain (CARDIA)    Difficulty of Paying Living Expenses: Not hard at all  Food Insecurity: No Food Insecurity (08/27/2022)   Received from [facility]   Hunger Vital Sign    Within the past 12 months, you worried that your food would run out before you got the money to buy more.: Never true    Within the past 12 months, the food you bought just didn't last and you didn't have money to get more.: Never true  Transportation Needs: No Transportation Needs (08/27/2022)   Received from [facility] - Transportation    In the past 12 months, has lack of transportation kept you from medical appointments or from getting medications?: No    Lack of Transportation (Non-Medical): No  Physical Activity: Not on file  Stress: Not on file  Social Connections: Not on file  Intimate Partner Violence: Not on file    FAMILY HISTORY: Family History  Problem Relation Age of Onset   Cancer Sister        breast CA     ALLERGIES:  is allergic to gazyva   [obinutuzumab ].  MEDICATIONS:  Current Outpatient Medications  Medication Sig Dispense Refill   acyclovir  (ZOVIRAX ) 400 MG tablet Take 1 tablet (400 mg total) by mouth 2 (two) times daily. 60 tablet 4   allopurinol  (ZYLOPRIM ) 300 MG tablet Take 1 tablet (300 mg total) by mouth 2 (two) times daily. 60 tablet 4   diphenhydrAMINE  (BENADRYL ) 25 mg capsule Take 25 mg by mouth every 6 (six) hours as needed for itching.     ondansetron  (ZOFRAN ) 8 MG tablet One pill every 8 hours as needed for nausea/vomitting. 40 tablet 1   prochlorperazine  (COMPAZINE ) 10 MG tablet Take 1 tablet (10 mg total) by mouth every 6 (six) hours as needed for nausea or vomiting. 40 tablet 1   rosuvastatin  (CRESTOR ) 5 MG tablet Take 1 tablet by mouth at bedtime.     sulfamethoxazole -trimethoprim  (BACTRIM  DS) 800-160 MG tablet One pill on Monday;wed;Friday 30 tablet 1   venetoclax  (VENCLEXTA ) 100 MG tablet Take 3 tablets (300 mg total) by mouth daily. Tablets should be swallowed whole with a meal and  a full glass of water. 84 tablet 0   No current facility-administered medications for this visit.    PHYSICAL EXAMINATION:  Vitals:   08/17/23 1428  BP: 123/79  Pulse: 70  Resp: 16  Temp: 98.4 F (36.9 C)  SpO2: 100%     Filed Weights   08/17/23 1428  Weight: 219 lb 1.6 oz (99.4 kg)    Physical Exam Vitals and nursing note reviewed.  HENT:     Head: Normocephalic and atraumatic.     Mouth/Throat:     Pharynx: Oropharynx is clear.  Eyes:     Extraocular Movements: Extraocular movements intact.     Pupils: Pupils are equal, round, and reactive to light.  Cardiovascular:     Rate and Rhythm: Normal rate and regular rhythm.  Pulmonary:     Comments: Decreased breath sounds bilaterally.  Abdominal:     Palpations: Abdomen is soft.  Musculoskeletal:        General: Normal range of motion.     Cervical back: Normal range of motion.  Skin:    General: Skin is warm.  Neurological:     General: No focal  deficit present.     Mental Status: He is alert and oriented to person, place, and time.  Psychiatric:        Behavior: Behavior normal.        Judgment: Judgment normal.     LABORATORY DATA:  I have reviewed the data as listed Lab Results  Component Value Date   WBC 3.1 (L) 08/17/2023   HGB 13.8 08/17/2023   HCT 42.6 08/17/2023   MCV 84.7 08/17/2023   PLT 223 08/17/2023   Recent Labs    06/10/23 0753 07/08/23 0816 08/17/23 1433  NA 137 137 139  K 3.9 3.9 3.8  CL 103 103 105  CO2 25 26 27   GLUCOSE 101* 110* 97  BUN 22* 18 13  CREATININE 1.00 0.90 0.92  CALCIUM  9.1 9.3 9.3  GFRNONAA >60 >60 >60  PROT 7.1 7.3 7.1  ALBUMIN 4.4 4.5 4.3  AST 23 24 23   ALT 19 21 20   ALKPHOS 85 88 88  BILITOT 1.0 1.1 1.1    RADIOGRAPHIC STUDIES: I have personally reviewed the radiological images as listed and agreed with the findings in the report. No results found.    CLL (chronic lymphocytic leukemia) (HCC) #CLL-CD38 positive stage III [retroperitoneal lymph nodes]-Asymptomatic. IGVH- UNMUTATED; JULY  FISH panel- 12 trisomy-   DEC 18th, 2024-  up to 17 mm- Stable lymphadenopathy involving the chest, abdomen and pelvis. enlargement of the spleen. Currently fixed duration therapy- Gazyva - started Venatoclax with cycle #2]. S/P Gazyva  cycle #6. JULY 7th, 2025- 1. Resolution of axillary mediastinal lymphadenopathy; No new enlarged lymph nodes chest;  Marked interval improvement in splenomegaly;  No evidence of lymphoma in the abdomen pelvis; Resolution of periaortic and iliac lymphadenopathy.  # Labs-CBC/chemistries- ANC 1.3  were reviewed with the patient. -CONTINUE  venetoclax  300mg /day] today- if worsening could consider 3 weeks ON and 1 week OFF-  Discussed with pharmacy.   Discussed with the patient will be on venetoclax  for total of 1 year [last cycle with JAN 2026].   #History of urinary retention /prostatomegaly /prostate cancer - on surveiilaince [Dr.Brandon]incidental-  2.1 x 0.9  cm stone or calcified mucosal lesion posterior left bladder wall in the region of the left UVJ. [[place]]  awaiting MRI  with Urology-     # CKD stage II-III- stable.      #  MS clinically - Stable.     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  # ID prophylaxis: continue Bactrim  DS; and Acyclovir -   stable.  # ACP [05/13/2023] Full code.  #Incidental findings on Imaging  CT , 2025:  Large volume stool in colon and rectum.  No obstructing lesion;  Sigmoid diverticulosis without diverticulitis.I reviewed/discussed/counseled the patient.   # IV access: PIV   PS # DISPOSITION:   # follow up in 4  weeks- MD; labs- cbc/cmp; LDH;   ';NO chemo- -  Dr.B  All questions were answered. The patient knows to call the clinic with any problems, questions or concerns.     Ralph JONELLE Joe, MD 08/17/2023 3:10 PM

## 2023-09-02 ENCOUNTER — Ambulatory Visit: Payer: Self-pay | Admitting: Physician Assistant

## 2023-09-02 ENCOUNTER — Ambulatory Visit
Admission: RE | Admit: 2023-09-02 | Discharge: 2023-09-02 | Disposition: A | Discharge status: Home / Self Care | Source: Ambulatory Visit | Attending: Physician Assistant | Admitting: Physician Assistant

## 2023-09-02 DIAGNOSIS — C9110 Chronic lymphocytic leukemia of B-cell type not having achieved remission: Secondary | ICD-10-CM | POA: Diagnosis not present

## 2023-09-02 DIAGNOSIS — K5730 Diverticulosis of large intestine without perforation or abscess without bleeding: Secondary | ICD-10-CM | POA: Diagnosis not present

## 2023-09-02 DIAGNOSIS — N400 Enlarged prostate without lower urinary tract symptoms: Secondary | ICD-10-CM | POA: Diagnosis not present

## 2023-09-02 DIAGNOSIS — C61 Malignant neoplasm of prostate: Secondary | ICD-10-CM | POA: Diagnosis not present

## 2023-09-02 MED ORDER — GADOBUTROL 1 MMOL/ML IV SOLN
10.0000 mL | Freq: Once | INTRAVENOUS | Status: AC | PRN
  Administered 2023-09-02: 10 mL via INTRAVENOUS

## 2023-09-04 ENCOUNTER — Other Ambulatory Visit: Payer: Self-pay | Admitting: Internal Medicine

## 2023-09-04 ENCOUNTER — Other Ambulatory Visit: Payer: Self-pay

## 2023-09-04 DIAGNOSIS — C9110 Chronic lymphocytic leukemia of B-cell type not having achieved remission: Secondary | ICD-10-CM

## 2023-09-08 ENCOUNTER — Other Ambulatory Visit (HOSPITAL_COMMUNITY): Payer: Self-pay

## 2023-09-08 ENCOUNTER — Other Ambulatory Visit: Payer: Self-pay

## 2023-09-08 MED ORDER — VENETOCLAX 100 MG PO TABS
300.0000 mg | ORAL_TABLET | Freq: Every day | ORAL | 0 refills | Status: DC
  Filled 2023-09-08: qty 84, 28d supply, fill #0

## 2023-09-10 ENCOUNTER — Other Ambulatory Visit: Payer: Self-pay

## 2023-09-10 NOTE — Progress Notes (Signed)
 Specialty Pharmacy Refill Coordination Note  Ralph [name] is a 58 y.o. male contacted today regarding refills of specialty medication(s) Venetoclax  (VENCLEXTA )   Patient requested [name] at [facility] at [place] date: 09/11/23   Medication will be filled on 09/10/23.

## 2023-09-14 ENCOUNTER — Encounter: Payer: Self-pay | Admitting: Internal Medicine

## 2023-09-14 ENCOUNTER — Inpatient Hospital Stay (HOSPITAL_BASED_OUTPATIENT_CLINIC_OR_DEPARTMENT_OTHER): Admitting: Internal Medicine

## 2023-09-14 ENCOUNTER — Inpatient Hospital Stay: Attending: Internal Medicine

## 2023-09-14 VITALS — BP 112/82 | HR 67 | Temp 98.7°F | Resp 16 | Ht 72.0 in | Wt 219.2 lb

## 2023-09-14 DIAGNOSIS — G35 Multiple sclerosis: Secondary | ICD-10-CM | POA: Diagnosis not present

## 2023-09-14 DIAGNOSIS — Z79899 Other long term (current) drug therapy: Secondary | ICD-10-CM | POA: Insufficient documentation

## 2023-09-14 DIAGNOSIS — Z79624 Long term (current) use of inhibitors of nucleotide synthesis: Secondary | ICD-10-CM | POA: Insufficient documentation

## 2023-09-14 DIAGNOSIS — N182 Chronic kidney disease, stage 2 (mild): Secondary | ICD-10-CM | POA: Insufficient documentation

## 2023-09-14 DIAGNOSIS — Z9079 Acquired absence of other genital organ(s): Secondary | ICD-10-CM | POA: Insufficient documentation

## 2023-09-14 DIAGNOSIS — C9110 Chronic lymphocytic leukemia of B-cell type not having achieved remission: Secondary | ICD-10-CM | POA: Insufficient documentation

## 2023-09-14 DIAGNOSIS — Z8546 Personal history of malignant neoplasm of prostate: Secondary | ICD-10-CM | POA: Diagnosis not present

## 2023-09-14 DIAGNOSIS — Z7969 Long term (current) use of other immunomodulators and immunosuppressants: Secondary | ICD-10-CM | POA: Diagnosis not present

## 2023-09-14 LAB — CBC WITH DIFFERENTIAL (CANCER CENTER ONLY)
Abs Immature Granulocytes: 0.02 K/uL (ref 0.00–0.07)
Basophils Absolute: 0 K/uL (ref 0.0–0.1)
Basophils Relative: 0 %
Eosinophils Absolute: 0 K/uL (ref 0.0–0.5)
Eosinophils Relative: 0 %
HCT: 45.8 % (ref 39.0–52.0)
Hemoglobin: 14.8 g/dL (ref 13.0–17.0)
Immature Granulocytes: 1 %
Lymphocytes Relative: 43 %
Lymphs Abs: 1.6 K/uL (ref 0.7–4.0)
MCH: 27.8 pg (ref 26.0–34.0)
MCHC: 32.3 g/dL (ref 30.0–36.0)
MCV: 86.1 fL (ref 80.0–100.0)
Monocytes Absolute: 0.4 K/uL (ref 0.1–1.0)
Monocytes Relative: 10 %
Neutro Abs: 1.7 K/uL (ref 1.7–7.7)
Neutrophils Relative %: 46 %
Platelet Count: 223 K/uL (ref 150–400)
RBC: 5.32 MIL/uL (ref 4.22–5.81)
RDW: 15.7 % — ABNORMAL HIGH (ref 11.5–15.5)
WBC Count: 3.7 K/uL — ABNORMAL LOW (ref 4.0–10.5)
nRBC: 0 % (ref 0.0–0.2)

## 2023-09-14 LAB — CMP (CANCER CENTER ONLY)
ALT: 17 U/L (ref 0–44)
AST: 23 U/L (ref 15–41)
Albumin: 4.7 g/dL (ref 3.5–5.0)
Alkaline Phosphatase: 86 U/L (ref 38–126)
Anion gap: 10 (ref 5–15)
BUN: 12 mg/dL (ref 6–20)
CO2: 25 mmol/L (ref 22–32)
Calcium: 9.4 mg/dL (ref 8.9–10.3)
Chloride: 102 mmol/L (ref 98–111)
Creatinine: 0.94 mg/dL (ref 0.61–1.24)
GFR, Estimated: >60 mL/min (ref >60)
Glucose, Bld: 95 mg/dL (ref 70–99)
Potassium: 3.9 mmol/L (ref 3.5–5.1)
Sodium: 137 mmol/L (ref 135–145)
Total Bilirubin: 1.1 mg/dL (ref 0.0–1.2)
Total Protein: 7.7 g/dL (ref 6.5–8.1)

## 2023-09-14 LAB — LACTATE DEHYDROGENASE: LDH: 162 U/L (ref 98–192)

## 2023-09-14 NOTE — Progress Notes (Signed)
 [facility] CONSULT NOTE  Patient Care Team: [name], MD as PCP - General (Family Medicine) Rennie Ralph SAUNDERS, MD as Consulting Physician (Oncology)  CHIEF COMPLAINTS/PURPOSE OF CONSULTATION: CLL  Oncology History Overview Note  Chronic lymphocytic leukemia (CLL), positive for CD20, CD22, CD19 and  CD38,  see comment.   ANNOTATION COMMENT IMP Comment VC   Comment: (NOTE)  Expression of CD38 on >30% of the clonal B-cells is reported to be an  unfavorable prognostic factor in CLL.     # CLL stage I- [lymphocytosis/retroperitoneal lymphadenopathy up to 2.5 cm-incidental CT protocol- 2023];   APRIL 2024- consistent with trisomy 12. Results for  CCND1/IGH, ATM, 13q and TP53 were normal.   # JAN 14th, 2024- Gazyva +  [ started Venatoclax with cycle #2]  with cycle # 4- Re-start at venetoclax  300mg /day [ANC 0.6- late APRIL 2025]  #GEN 2023-bilateral severe hydronephrosis-bladder outlet obstruction/status post catheter [Dr.Brandon]  #History of prostate cancer under surveillance [[facility]]   CLL (chronic lymphocytic leukemia) (HCC)  02/11/2021 Initial Diagnosis   CLL (chronic lymphocytic leukemia) (HCC)   01/20/2023 -  Chemotherapy   Patient is on Treatment Plan : LYMPHOMA CLL/SLL Venetoclax  + Obinutuzumab  q28d      HISTORY OF PRESENTING ILLNESS: Alone.  Ambulating independently.  Ralph [name] 58 y.o.  male history of multiple sclerosis-not on active therapy; prostate cancer on surveillance; CLL currently status post 6 cycles of Gazyva  [ started Venatoclax with cycle #2] is here for a follow-up.  Patient denies any fevers or chills. Patient is doing well.  Appetite is reported as good.  Gained weight. Denies any lumps or bumps.   Denies nausea. Bowels normal. No fevers at home. No pain. No diarrhea. Denies neuropathy.     Review of Systems  Constitutional:  Negative for chills, diaphoresis, fever and malaise/fatigue.  HENT:  Negative for nosebleeds and  sore throat.   Eyes:  Negative for double vision.  Respiratory:  Negative for cough, hemoptysis, sputum production, shortness of breath and wheezing.   Cardiovascular:  Negative for chest pain, palpitations, orthopnea and leg swelling.  Gastrointestinal:  Negative for abdominal pain, blood in stool, constipation, diarrhea, heartburn, melena, nausea and vomiting.  Genitourinary:  Negative for dysuria, frequency and urgency.  Musculoskeletal:  Negative for back pain and joint pain.  Skin: Negative.  Negative for itching and rash.  Neurological:  Negative for dizziness, tingling, focal weakness, weakness and headaches.  Endo/Heme/Allergies:  Does not bruise/bleed easily.  Psychiatric/Behavioral:  Negative for depression. The patient is not nervous/anxious and does not have insomnia.      MEDICAL HISTORY:  Past Medical History:  Diagnosis Date   Acute renal failure (HCC)    Anxiety    Bladder outlet obstruction    CLL (chronic lymphocytic leukemia) (HCC)    Elevated PSA    Foreign body of bladder    MS (multiple sclerosis) (HCC)    Pre-diabetes    Prostate cancer (HCC) 2013   UTI (urinary tract infection)     SURGICAL HISTORY: Past Surgical History:  Procedure Laterality Date   CYSTOSCOPY N/A 12/16/2021   Procedure: CYSTOSCOPY WITH REMOVAL OF FOREIGN BODY;  Surgeon: Penne Knee, MD;  Location: [facility];  Service: Urology;  Laterality: N/A;   HOLEP-LASER ENUCLEATION OF THE PROSTATE WITH MORCELLATION N/A 03/11/2021   Procedure: HOLEP-LASER ENUCLEATION OF THE PROSTATE WITH MORCELLATION;  Surgeon: Penne Knee, MD;  Location: [facility];  Service: Urology;  Laterality: N/A;   KNEE SURGERY  2002   meniscus  TRANSURETHRAL RESECTION OF PROSTATE N/A 12/16/2021   Procedure: TRANSURETHRAL RESECTION OF THE PROSTATE (TURP);  Surgeon: Penne Knee, MD;  Location: [facility] ORS;  Service: Urology;  Laterality: N/A;    SOCIAL HISTORY: Social History   Socioeconomic History   Marital  status: Single    Spouse name: Not on file   Number of children: 0   Years of education: College   Highest education level: Not on file  Occupational History   Occupation: Research officer, 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  Denies any alcohol; lives in [place].    Social Drivers of Health   Financial Resource Strain: Low Risk  (08/27/2022)   Received from [facility]   Overall Financial Resource Strain (CARDIA)    Difficulty of Paying Living Expenses: Not hard at all  Food Insecurity: No Food Insecurity (08/27/2022)   Received from [facility]   Hunger Vital Sign    Within the past 12 months, you worried that your food would run out before you got the money to buy more.: Never true    Within the past 12 months, the food you bought just didn't last and you didn't have money to get more.: Never true  Transportation Needs: No Transportation Needs (08/27/2022)   Received from [facility] - Transportation    In the past 12 months, has lack of transportation kept you from medical appointments or from getting medications?: No    Lack of Transportation (Non-Medical): No  Physical Activity: Not on file  Stress: Not on file  Social Connections: Not on file  Intimate Partner Violence: Not on file    FAMILY HISTORY: Family History  Problem Relation Age of Onset   Cancer Sister        breast CA     ALLERGIES:  is allergic to gazyva  [obinutuzumab ].  MEDICATIONS:  Current Outpatient Medications  Medication Sig  Dispense Refill   acyclovir  (ZOVIRAX ) 400 MG tablet Take 1 tablet (400 mg total) by mouth 2 (two) times daily. 60 tablet 4   allopurinol  (ZYLOPRIM ) 300 MG tablet Take 1 tablet (300 mg total) by mouth 2 (two) times daily. 60 tablet 4   diphenhydrAMINE  (BENADRYL ) 25 mg capsule Take 25 mg by mouth every 6 (six) hours as needed for itching.     rosuvastatin  (CRESTOR ) 5 MG tablet Take 1 tablet by mouth at bedtime.     sulfamethoxazole -trimethoprim  (BACTRIM  DS) 800-160 MG tablet One pill on Monday;wed;Friday 30 tablet 1   venetoclax  (VENCLEXTA ) 100 MG tablet Take 3 tablets (300 mg total) by mouth daily. Tablets should be swallowed whole with a meal and a full glass of water. 84 tablet 0   ondansetron  (ZOFRAN ) 8 MG tablet One pill every 8 hours as needed for nausea/vomitting. (Patient not taking: Reported on 09/14/2023) 40 tablet 1   prochlorperazine  (COMPAZINE ) 10 MG tablet Take 1 tablet (10 mg total) by mouth every 6 (six) hours as needed for nausea  or vomiting. (Patient not taking: Reported on 09/14/2023) 40 tablet 1   No current facility-administered medications for this visit.    PHYSICAL EXAMINATION:  Vitals:   09/14/23 1515  BP: 112/82  Pulse: 67  Resp: 16  Temp: 98.7 F (37.1 C)  SpO2: 100%     Filed Weights   09/14/23 1515  Weight: 219 lb 3.2 oz (99.4 kg)    Physical Exam Vitals and nursing note reviewed.  HENT:     Head: Normocephalic and atraumatic.     Mouth/Throat:     Pharynx: Oropharynx is clear.  Eyes:     Extraocular Movements: Extraocular movements intact.     Pupils: Pupils are equal, round, and reactive to light.  Cardiovascular:     Rate and Rhythm: Normal rate and regular rhythm.  Pulmonary:     Comments: Decreased breath sounds bilaterally.  Abdominal:     Palpations: Abdomen is soft.  Musculoskeletal:        General: Normal range of motion.     Cervical back: Normal range of motion.  Skin:    General: Skin is warm.  Neurological:     General: No focal  deficit present.     Mental Status: He is alert and oriented to person, place, and time.  Psychiatric:        Behavior: Behavior normal.        Judgment: Judgment normal.     LABORATORY DATA:  I have reviewed the data as listed Lab Results  Component Value Date   WBC 3.7 (L) 09/14/2023   HGB 14.8 09/14/2023   HCT 45.8 09/14/2023   MCV 86.1 09/14/2023   PLT 223 09/14/2023   Recent Labs    07/08/23 0816 08/17/23 1433 09/14/23 1520  NA 137 139 137  K 3.9 3.8 3.9  CL 103 105 102  CO2 26 27 25   GLUCOSE 110* 97 95  BUN 18 13 12   CREATININE 0.90 0.92 0.94  CALCIUM  9.3 9.3 9.4  GFRNONAA >60 >60 >60  PROT 7.3 7.1 7.7  ALBUMIN 4.5 4.3 4.7  AST 24 23 23   ALT 21 20 17   ALKPHOS 88 88 86  BILITOT 1.1 1.1 1.1    RADIOGRAPHIC STUDIES: I have personally reviewed the radiological images as listed and agreed with the findings in the report. MR PROSTATE W WO CONTRAST Result Date: 09/02/2023 CLINICAL DATA:  Low risk prostate cancer under active surveillance. C61. Also history of chronic lymphocytic leukemia. EXAM: MR PROSTATE WITHOUT AND WITH CONTRAST TECHNIQUE: Multiplanar multisequence MRI images were obtained of the pelvis centered about the prostate. Pre and post contrast images were obtained. CONTRAST:  10mL GADAVIST  GADOBUTROL  1 MMOL/ML IV SOLN COMPARISON:  CT pelvis 07/13/2023 FINDINGS: Prostate: Encapsulated nodularity in the transition zone compatible with benign prostatic hypertrophy. Hazy low T2 signal in the peripheral zone is nonfocal and not associated with focal reduced ADC map activity, likely postinflammatory, and is considered PI-RADS category 2. No focal lesion of intermediate or higher suspicion for prostate cancer is identified. Volume: Ellipsoid volume calculation: The prostate gland measures 6.0 by 5.4 by 4.5 cm (volume = 76 cm^3). Transcapsular spread: Absent Seminal vesicle involvement: Absent Neurovascular bundle involvement: Absent Pelvic adenopathy: Absent Bone  metastasis: Absent Other findings: Sigmoid colon diverticulosis. IMPRESSION: 1. No focal lesion of intermediate or higher suspicion for prostate cancer is identified. 2. Prostatomegaly and benign prostatic hypertrophy. 3. Sigmoid colon diverticulosis. Electronically Signed   By: Ryan Salvage M.D.   On: 09/02/2023 15:03  CLL (chronic lymphocytic leukemia) (HCC) #CLL-CD38 positive stage III [retroperitoneal lymph nodes]-Asymptomatic. IGVH- UNMUTATED; JULY  FISH panel- 12 trisomy-   DEC 18th, 2024-  up to 17 mm- Stable lymphadenopathy involving the chest, abdomen and pelvis. enlargement of the spleen. Currently fixed duration therapy- Gazyva - started Venatoclax with cycle #2]. S/P Gazyva  cycle #6. JULY 7th, 2025- 1. Resolution of axillary mediastinal lymphadenopathy; No new enlarged lymph nodes chest;  Marked interval improvement in splenomegaly;  No evidence of lymphoma in the abdomen pelvis; Resolution of periaortic and iliac lymphadenopathy.  # Labs-CBC/chemistries- ANC 1.3  were reviewed with the patient. -CONTINUE  venetoclax  300mg /day] Discussed with pharmacy. Discussed with the patient will be on venetoclax  for total of 1 year [last cycle with JAN 2026].   #History of urinary retention /prostatomegaly /prostate cancer - on [facility]]- AUG 2025  MRI  with Urology- on surveillance.   # CKD stage II-III- stable.      # MS clinically - Stable.     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  # ID prophylaxis: continue Bactrim  DS; and Acyclovir -   stable.  # ACP [05/13/2023] Full code.  # IV access: PIV   PS # DISPOSITION:   # follow up in 2 months-- MD; labs- cbc/cmp; LDH-NO chemo- -  Dr.B  All questions were answered. The patient knows to call the clinic with any problems, questions or concerns.      Ralph JONELLE Joe, MD 09/14/2023 10:28 PM

## 2023-09-14 NOTE — Assessment & Plan Note (Addendum)
#  CLL-CD38 positive stage III [retroperitoneal lymph nodes]-Asymptomatic. IGVH- UNMUTATED; JULY  FISH panel- 12 trisomy-   DEC 18th, 2024-  up to 17 mm- Stable lymphadenopathy involving the chest, abdomen and pelvis. enlargement of the spleen. Currently fixed duration therapy- Gazyva - started Venatoclax with cycle #2]. S/P Gazyva  cycle #6. JULY 7th, 2025- 1. Resolution of axillary mediastinal lymphadenopathy; No new enlarged lymph nodes chest;  Marked interval improvement in splenomegaly;  No evidence of lymphoma in the abdomen pelvis; Resolution of periaortic and iliac lymphadenopathy.  # Labs-CBC/chemistries- ANC 1.3  were reviewed with the patient. -CONTINUE  venetoclax  300mg /day] Discussed with pharmacy. Discussed with the patient will be on venetoclax  for total of 1 year [last cycle with JAN 2026].   #History of urinary retention /prostatomegaly /prostate cancer - on [facility]]- AUG 2025  MRI  with Urology- on surveillance.   # CKD stage II-III- stable.      # MS clinically - Stable.     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  # ID prophylaxis: continue Bactrim  DS; and Acyclovir -   stable.  # ACP [05/13/2023] Full code.  # IV access: PIV   PS # DISPOSITION:   # follow up in 2 months-- MD; labs- cbc/cmp; LDH-NO chemo- -  Dr.B

## 2023-09-14 NOTE — Progress Notes (Signed)
 MRI prostate 09/02/23.

## 2023-09-18 IMAGING — MR MR HEAD WO/W CM
14 series · 48 of 48 positions shown · IV contrast (10ml Gadavist)
Comparison: Head MRI report from 12/08/2001 (images not available)

CLINICAL DATA: Multiple sclerosis.

EXAM:
MRI HEAD WITHOUT AND WITH CONTRAST
TECHNIQUE: Multiplanar, multiecho pulse sequences of the brain and surrounding
structures were obtained without and with intravenous contrast.
CONTRAST:  10mL GADAVIST GADOBUTROL 1 MMOL/ML IV SOLN

[Series 5: ax dwi_tracew · axial · 3.0mm · 0.65mm/px · z∈[-84,+77]mm · 4 of 50 slices shown]
[im 1/50]
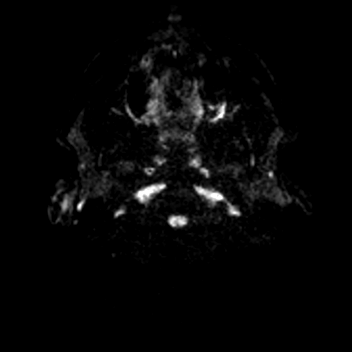
[im 17/50]
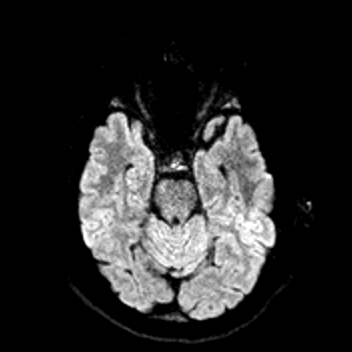
[im 33/50]
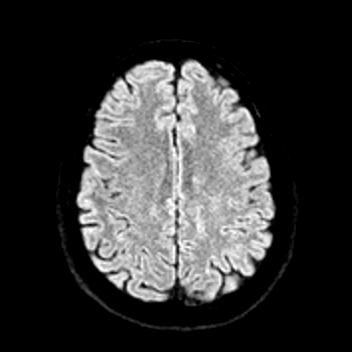
[im 50/50]
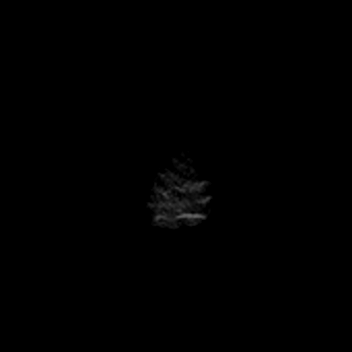

[Series 6: ax dwi_adc · axial · 3.0mm · 0.65mm/px · z∈[-84,+77]mm · 3 of 50 slices shown]
[im 1/50]
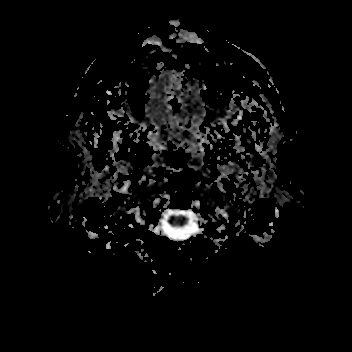
[im 25/50]
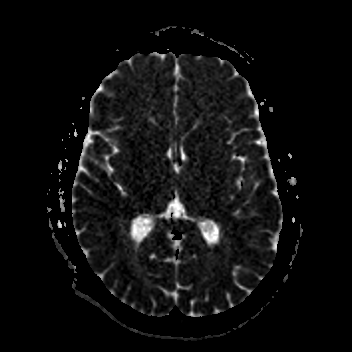
[im 50/50]
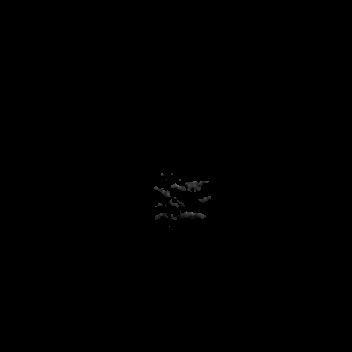

[Series 7: cor dwi_tracew · coronal · 5.0mm · 0.65mm/px · 2 of 40 slices shown]
[im 1/40]
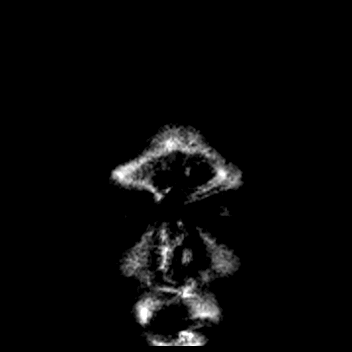
[im 40/40]
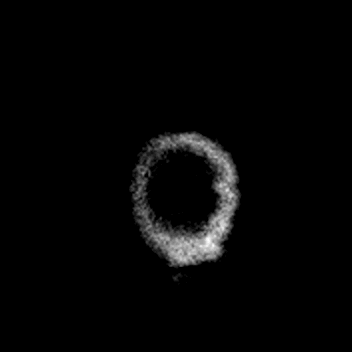

[Series 8: cor dwi_adc · coronal · 5.0mm · 0.65mm/px · 2 of 40 slices shown]
[im 1/40]
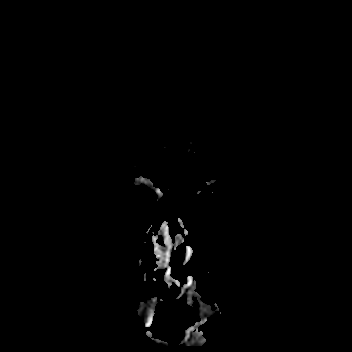
[im 40/40]
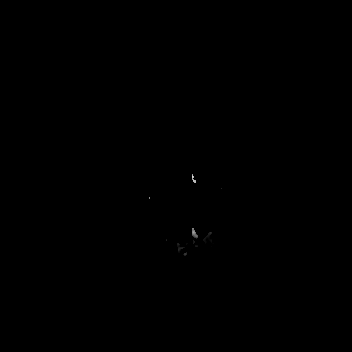

[Series 9: T1 · sagittal · 5.0mm · 0.62mm/px · 1 of 25 slices shown (1 of 2)]
[im 1/25]
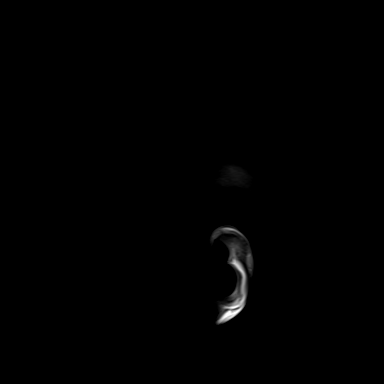

[Series 10: T2 · axial · 5.0mm · 0.53mm/px · z∈[-89,+78]mm · 2 of 29 slices shown]
[im 1/29]
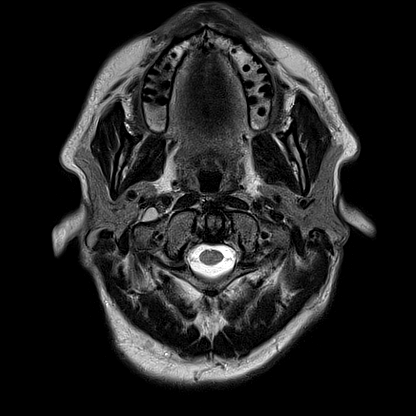
[im 29/29]
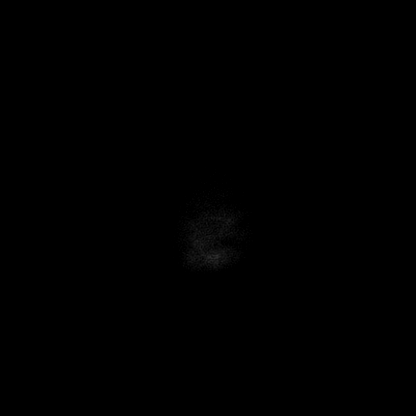

[Series 12: pha_images · axial · 3.0mm · 0.90mm/px · z∈[-94,+76]mm · 3 of 58 slices shown]
[im 1/58]
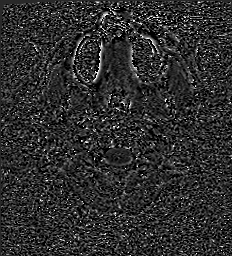
[im 29/58]
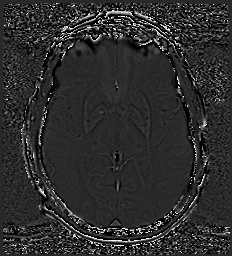
[im 58/58]
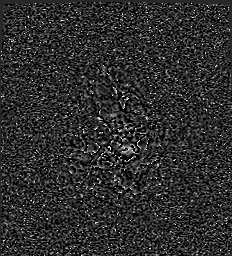

[Series 13: swi_images · axial · 3.0mm · 0.90mm/px · z∈[-94,+82]mm · 3 of 60 slices shown]
[im 1/60]
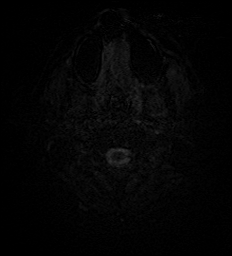
[im 30/60]
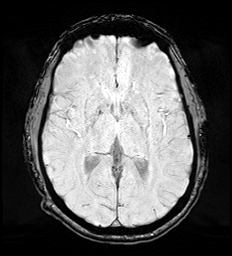
[im 60/60]
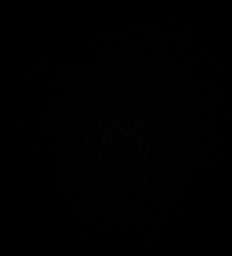

[Series 15: FLAIR · axial · 3.0mm · 0.53mm/px · z∈[-86,+75]mm · 3 of 55 slices shown]
[im 1/55]
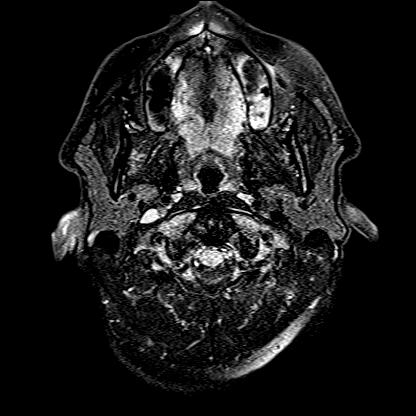
[im 28/55]
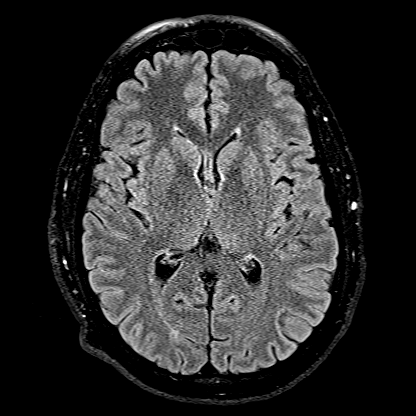
[im 55/55]
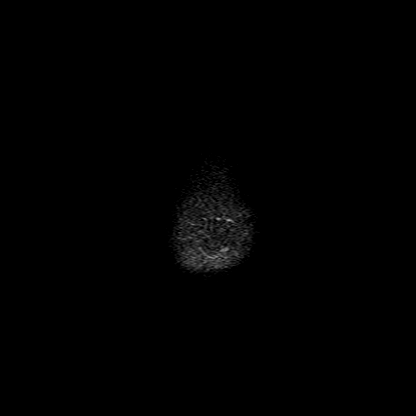

[Series 16: T1 · axial · 1.0mm · 0.98mm/px · z∈[-89,+85]mm · 10 of 175 slices shown (2 of 2)]
[im 1/175]
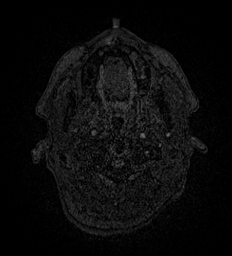
[im 20/175]
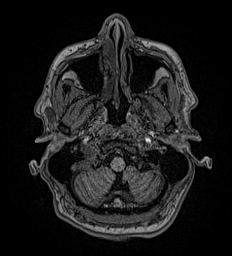
[im 39/175]
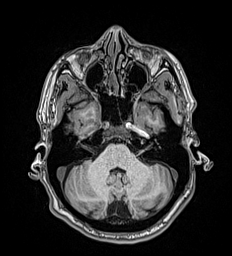
[im 59/175]
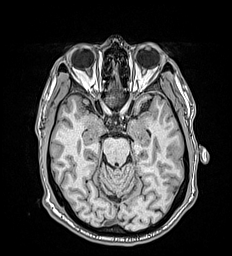
[im 78/175]
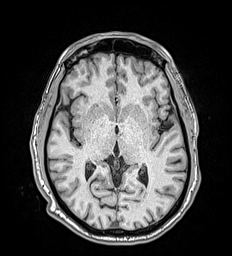
[im 97/175]
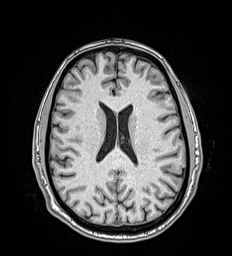
[im 117/175]
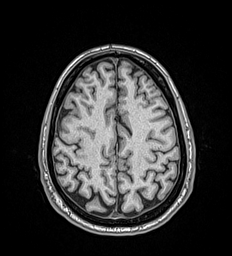
[im 136/175]
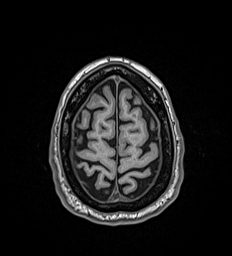
[im 155/175]
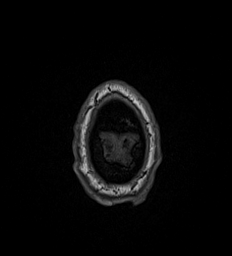
[im 175/175]
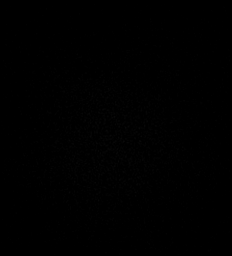

[Series 17: T2 post-contrast · coronal · 5.0mm · 0.57mm/px · 2 of 31 slices shown]
[im 1/31]
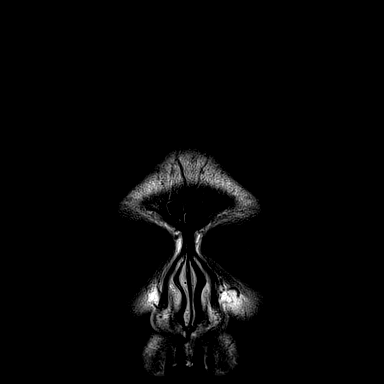
[im 31/31]
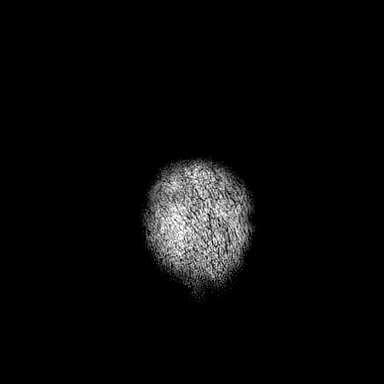

[Series 18: T1 post-contrast · axial · 1.0mm · 0.98mm/px · z∈[-89,+85]mm · 10 of 176 slices shown (1 of 3)]
[im 1/176]
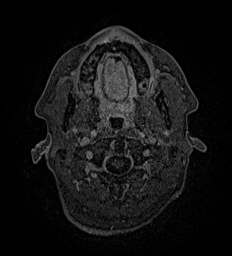
[im 20/176]
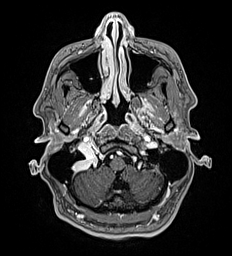
[im 39/176]
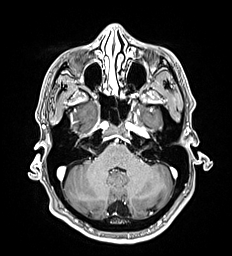
[im 59/176]
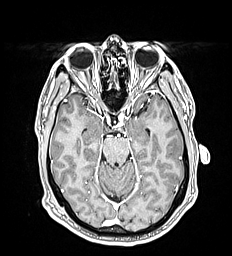
[im 78/176]
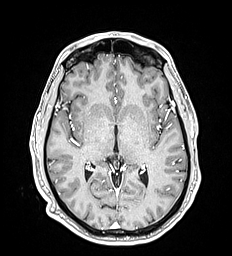
[im 98/176]
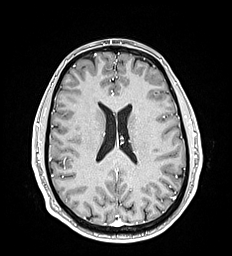
[im 117/176]
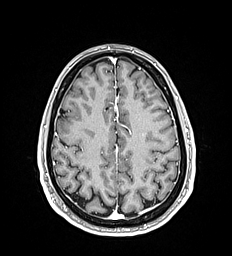
[im 137/176]
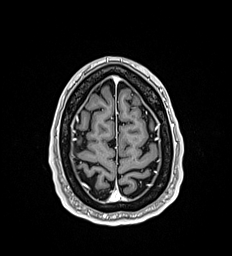
[im 156/176]
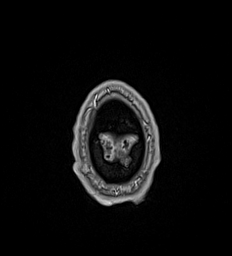
[im 176/176]
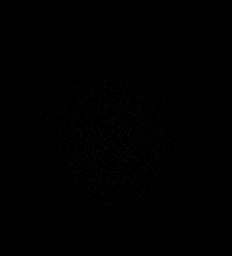

[Series 19: T1 post-contrast · coronal · 5.0mm · 0.57mm/px · 2 of 31 slices shown (2 of 3)]
[im 1/31]
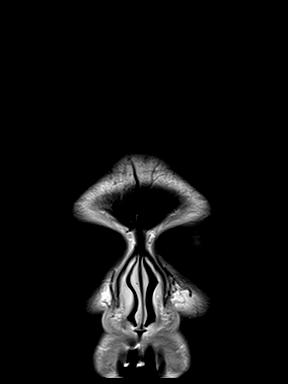
[im 31/31]
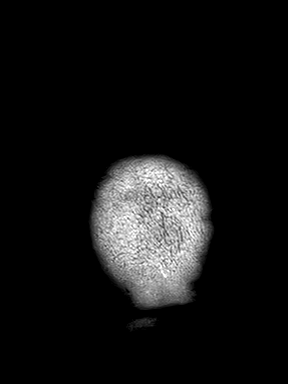

[Series 20: T1 post-contrast · sagittal · 5.0mm · 0.62mm/px · 1 of 25 slices shown (3 of 3)]
[im 1/25]
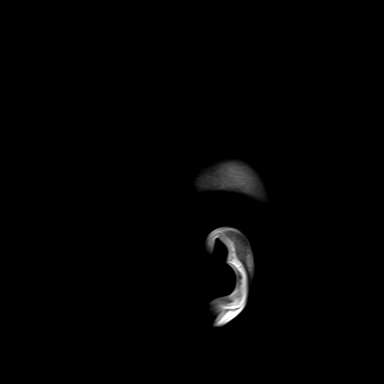

[48 of 48 positions shown; findings below may reference images not displayed]

FINDINGS: Brain: There is no evidence of an acute infarct, intracranial
hemorrhage, mass, midline shift, or extra-axial fluid collection.
The ventricles and sulci are normal. Small foci of T2 FLAIR
hyperintensity are present in the periventricular white matter
adjacent to the frontal horn and atrium of the right lateral
ventricle, in the radiating white matter above the body of the left
lateral ventricle, and in the subcortical white matter of the left
superior frontal gyrus. No abnormal enhancement is identified. The
brainstem and cerebellum are normal in signal.

Vascular: Major intracranial vascular flow voids are preserved.

Skull and upper cervical spine: Unremarkable bone marrow signal.

Sinuses/Orbits: Unremarkable orbits. Paranasal sinuses and mastoid
air cells are clear.

Other: None.
IMPRESSION: Several small T2 hyperintensities in the cerebral white matter
compatible with the history of multiple sclerosis. No evidence of
active demyelination.

## 2023-10-01 ENCOUNTER — Encounter: Payer: Self-pay | Admitting: Internal Medicine

## 2023-10-07 ENCOUNTER — Other Ambulatory Visit: Payer: Self-pay

## 2023-10-07 ENCOUNTER — Other Ambulatory Visit: Payer: Self-pay | Admitting: Internal Medicine

## 2023-10-07 DIAGNOSIS — C9110 Chronic lymphocytic leukemia of B-cell type not having achieved remission: Secondary | ICD-10-CM

## 2023-10-08 ENCOUNTER — Encounter: Payer: Self-pay | Admitting: Internal Medicine

## 2023-10-08 ENCOUNTER — Other Ambulatory Visit: Payer: Self-pay

## 2023-10-08 ENCOUNTER — Other Ambulatory Visit (HOSPITAL_COMMUNITY): Payer: Self-pay

## 2023-10-08 MED ORDER — VENETOCLAX 100 MG PO TABS
300.0000 mg | ORAL_TABLET | Freq: Every day | ORAL | 0 refills | Status: DC
  Filled 2023-10-08 – 2023-10-09 (×2): qty 84, 28d supply, fill #0

## 2023-10-09 ENCOUNTER — Other Ambulatory Visit: Payer: Self-pay

## 2023-10-09 NOTE — Progress Notes (Signed)
 Specialty Pharmacy Refill Coordination Note  Ralph [name] is a 58 y.o. male contacted today regarding refills of specialty medication(s) Venetoclax  (VENCLEXTA )   Patient requested [name] at [facility] at [place] date: 10/09/23   Medication will be filled on 10/09/23.

## 2023-10-13 ENCOUNTER — Telehealth: Payer: Self-pay | Admitting: Pharmacy Technician

## 2023-10-13 NOTE — Telephone Encounter (Signed)
 Oral Oncology Patient Advocate Encounter   Re-authorization submitted by nursing team   Received notification that prior authorization for Venclexta  is required.   PA submitted on CoverMyMeds on 10/12/2023 Key [id number]  Prior Authorization for Venclexta  has been approved.    PA# [id number] Effective dates: 09/12/2023 through 10/11/2025  Patient can continue to fill at [facility].   Cimone Fahey (Patty) Chet Burnet, CPhT  [facility], Zelda Salmon, [facility] - Oral Chemotherapy Patient Advocate Specialist III Phone: [phone]  Fax: [phone]

## 2023-10-15 ENCOUNTER — Other Ambulatory Visit: Payer: Self-pay | Admitting: Internal Medicine

## 2023-10-19 ENCOUNTER — Other Ambulatory Visit: Payer: Self-pay | Admitting: Internal Medicine

## 2023-10-21 ENCOUNTER — Other Ambulatory Visit: Payer: Self-pay

## 2023-10-22 ENCOUNTER — Other Ambulatory Visit: Payer: Self-pay

## 2023-10-22 NOTE — Progress Notes (Signed)
 Specialty Pharmacy Ongoing Clinical Assessment Note  Ralph [name] is a 58 y.o. male who is being followed by the specialty pharmacy service for RxSp Oncology   Patient's specialty medication(s) reviewed today: Venetoclax  (VENCLEXTA )   Missed doses in the last 4 weeks: 0   Patient/Caregiver did not have any additional questions or concerns.   Therapeutic benefit summary: Patient is achieving benefit   Adverse events/side effects summary: No adverse events/side effects   Patient's therapy is appropriate to: Continue    Goals Addressed             This Visit's Progress    Achieve or maintain remission   On track    Patient is on track. Patient will maintain adherence and be monitored by provider to determine if a change in treatment plan is warranted.           Follow up: 3 months  [facility]

## 2023-10-30 ENCOUNTER — Other Ambulatory Visit: Payer: Self-pay | Admitting: Internal Medicine

## 2023-10-30 ENCOUNTER — Other Ambulatory Visit (HOSPITAL_COMMUNITY): Payer: Self-pay

## 2023-10-30 ENCOUNTER — Other Ambulatory Visit: Payer: Self-pay

## 2023-10-30 DIAGNOSIS — C9110 Chronic lymphocytic leukemia of B-cell type not having achieved remission: Secondary | ICD-10-CM

## 2023-10-30 MED ORDER — VENETOCLAX 100 MG PO TABS
300.0000 mg | ORAL_TABLET | Freq: Every day | ORAL | 0 refills | Status: DC
  Filled 2023-10-30 – 2023-11-03 (×2): qty 84, 28d supply, fill #0

## 2023-11-03 ENCOUNTER — Other Ambulatory Visit: Payer: Self-pay

## 2023-11-03 ENCOUNTER — Other Ambulatory Visit (HOSPITAL_COMMUNITY): Payer: Self-pay

## 2023-11-05 ENCOUNTER — Other Ambulatory Visit: Payer: Self-pay | Admitting: Pharmacy Technician

## 2023-11-05 ENCOUNTER — Other Ambulatory Visit: Payer: Self-pay

## 2023-11-05 NOTE — Progress Notes (Signed)
 Specialty Pharmacy Refill Coordination Note  Ralph [name] is a 58 y.o. male contacted today regarding refills of specialty medication(s) Venetoclax  (VENCLEXTA )   Patient requested [name] at [facility] at [place] date: 11/06/23   Medication will be filled on: 11/05/23

## 2023-11-06 ENCOUNTER — Other Ambulatory Visit (HOSPITAL_COMMUNITY): Payer: Self-pay

## 2023-11-06 ENCOUNTER — Encounter: Payer: Self-pay | Admitting: Internal Medicine

## 2023-11-09 ENCOUNTER — Encounter: Payer: Self-pay | Admitting: Radiology

## 2023-11-13 ENCOUNTER — Other Ambulatory Visit: Payer: Self-pay | Admitting: Internal Medicine

## 2023-11-16 ENCOUNTER — Encounter: Payer: Self-pay | Admitting: Internal Medicine

## 2023-11-16 ENCOUNTER — Inpatient Hospital Stay (HOSPITAL_BASED_OUTPATIENT_CLINIC_OR_DEPARTMENT_OTHER): Admitting: Internal Medicine

## 2023-11-16 ENCOUNTER — Inpatient Hospital Stay

## 2023-11-16 ENCOUNTER — Other Ambulatory Visit: Payer: Self-pay

## 2023-11-16 ENCOUNTER — Inpatient Hospital Stay: Attending: Internal Medicine

## 2023-11-16 VITALS — BP 114/82 | HR 75 | Temp 97.6°F | Resp 18 | Wt 225.5 lb

## 2023-11-16 DIAGNOSIS — Z23 Encounter for immunization: Secondary | ICD-10-CM

## 2023-11-16 DIAGNOSIS — C9110 Chronic lymphocytic leukemia of B-cell type not having achieved remission: Secondary | ICD-10-CM | POA: Insufficient documentation

## 2023-11-16 DIAGNOSIS — Z7969 Long term (current) use of other immunomodulators and immunosuppressants: Secondary | ICD-10-CM | POA: Diagnosis not present

## 2023-11-16 DIAGNOSIS — Z79899 Other long term (current) drug therapy: Secondary | ICD-10-CM | POA: Diagnosis not present

## 2023-11-16 DIAGNOSIS — Z79624 Long term (current) use of inhibitors of nucleotide synthesis: Secondary | ICD-10-CM | POA: Diagnosis not present

## 2023-11-16 DIAGNOSIS — Z803 Family history of malignant neoplasm of breast: Secondary | ICD-10-CM | POA: Diagnosis not present

## 2023-11-16 LAB — CBC WITH DIFFERENTIAL (CANCER CENTER ONLY)
Abs Immature Granulocytes: 0.11 K/uL — ABNORMAL HIGH (ref 0.00–0.07)
Basophils Absolute: 0 K/uL (ref 0.0–0.1)
Basophils Relative: 0 %
Eosinophils Absolute: 0 K/uL (ref 0.0–0.5)
Eosinophils Relative: 0 %
HCT: 46.1 % (ref 39.0–52.0)
Hemoglobin: 15.1 g/dL (ref 13.0–17.0)
Immature Granulocytes: 3 %
Lymphocytes Relative: 41 %
Lymphs Abs: 1.5 K/uL (ref 0.7–4.0)
MCH: 28.8 pg (ref 26.0–34.0)
MCHC: 32.8 g/dL (ref 30.0–36.0)
MCV: 88 fL (ref 80.0–100.0)
Monocytes Absolute: 0.5 K/uL (ref 0.1–1.0)
Monocytes Relative: 13 %
Neutro Abs: 1.5 K/uL — ABNORMAL LOW (ref 1.7–7.7)
Neutrophils Relative %: 43 %
Platelet Count: 192 K/uL (ref 150–400)
RBC: 5.24 MIL/uL (ref 4.22–5.81)
RDW: 15.9 % — ABNORMAL HIGH (ref 11.5–15.5)
WBC Count: 3.7 K/uL — ABNORMAL LOW (ref 4.0–10.5)
nRBC: 0 % (ref 0.0–0.2)

## 2023-11-16 LAB — CMP (CANCER CENTER ONLY)
ALT: 20 U/L (ref 0–44)
AST: 25 U/L (ref 15–41)
Albumin: 4.5 g/dL (ref 3.5–5.0)
Alkaline Phosphatase: 86 U/L (ref 38–126)
Anion gap: 9 (ref 5–15)
BUN: 16 mg/dL (ref 6–20)
CO2: 26 mmol/L (ref 22–32)
Calcium: 9.1 mg/dL (ref 8.9–10.3)
Chloride: 102 mmol/L (ref 98–111)
Creatinine: 0.82 mg/dL (ref 0.61–1.24)
GFR, Estimated: >60 mL/min (ref >60)
Glucose, Bld: 92 mg/dL (ref 70–99)
Potassium: 3.7 mmol/L (ref 3.5–5.1)
Sodium: 137 mmol/L (ref 135–145)
Total Bilirubin: 1.2 mg/dL (ref 0.0–1.2)
Total Protein: 7.2 g/dL (ref 6.5–8.1)

## 2023-11-16 LAB — LACTATE DEHYDROGENASE: LDH: 182 U/L (ref 98–192)

## 2023-11-16 MED ORDER — INFLUENZA VIRUS VACC SPLIT PF (FLUZONE) 0.5 ML IM SUSY: 0.5000 mL | PREFILLED_SYRINGE | INTRAMUSCULAR | Status: DC

## 2023-11-16 MED ORDER — VENETOCLAX 100 MG PO TABS
300.0000 mg | ORAL_TABLET | Freq: Every day | ORAL | 1 refills | Status: DC
  Filled 2023-11-16 – 2023-12-01 (×2): qty 84, 28d supply, fill #0
  Filled 2023-12-30: qty 84, 28d supply, fill #1

## 2023-11-16 MED ORDER — INFLUENZA VIRUS VACC SPLIT PF (FLUZONE) 0.5 ML IM SUSY
0.5000 mL | PREFILLED_SYRINGE | Freq: Once | INTRAMUSCULAR | Status: AC
  Administered 2023-11-16: 0.5 mL via INTRAMUSCULAR
  Filled 2023-11-16: qty 0.5

## 2023-11-16 NOTE — Progress Notes (Unsigned)
 Patient has no concerns

## 2023-11-16 NOTE — Progress Notes (Unsigned)
 [name] CONSULT NOTE  Patient Care Team: [name], MD as PCP - General (Family Medicine) [name], MD as Consulting Physician (Oncology)  CHIEF COMPLAINTS/PURPOSE OF CONSULTATION: CLL  Oncology History Overview Note  Chronic lymphocytic leukemia (CLL), positive for CD20, CD22, CD19 and  CD38,  see comment.   ANNOTATION COMMENT IMP Comment VC   Comment: (NOTE)  Expression of CD38 on >30% of the clonal B-cells is reported to be an  unfavorable prognostic factor in CLL.     # CLL stage I- [lymphocytosis/retroperitoneal lymphadenopathy up to 2.5 cm-incidental CT protocol- 2023];   APRIL 2024- consistent with trisomy 12. Results for  CCND1/IGH, ATM, 13q and TP53 were normal.   # JAN 14th, 2024- Gazyva +  [ started Venatoclax with cycle #2]  with cycle # 4- Re-start at venetoclax  300mg /day [ANC 0.6- late APRIL 2025]  #GEN 2023-bilateral severe hydronephrosis-bladder outlet obstruction/status post catheter [Dr.Brandon]  #History of prostate cancer under surveillance [[facility]]   CLL (chronic lymphocytic leukemia) (HCC)  02/11/2021 Initial Diagnosis   CLL (chronic lymphocytic leukemia) (HCC)   01/20/2023 -  Chemotherapy   Patient is on Treatment Plan : LYMPHOMA CLL/SLL Venetoclax  + Obinutuzumab  q28d      HISTORY OF PRESENTING ILLNESS: Alone.  Ambulating independently.  Ralph [name] 58 y.o.  male history of multiple sclerosis-not on active therapy; prostate cancer on surveillance; CLL currently status post 6 cycles of Gazyva  [ started Venatoclax with cycle #2] is here for a follow-up.  Patient denies any fevers or chills. Patient is doing well.  Appetite is reported as good.  Gained weight. Denies any lumps or bumps.   Denies nausea. Bowels normal. No fevers at home. No pain. No diarrhea. Denies neuropathy.     Review of Systems  Constitutional:  Negative for chills, diaphoresis, fever and malaise/fatigue.  HENT:  Negative for nosebleeds and  sore throat.   Eyes:  Negative for double vision.  Respiratory:  Negative for cough, hemoptysis, sputum production, shortness of breath and wheezing.   Cardiovascular:  Negative for chest pain, palpitations, orthopnea and leg swelling.  Gastrointestinal:  Negative for abdominal pain, blood in stool, constipation, diarrhea, heartburn, melena, nausea and vomiting.  Genitourinary:  Negative for dysuria, frequency and urgency.  Musculoskeletal:  Negative for back pain and joint pain.  Skin: Negative.  Negative for itching and rash.  Neurological:  Negative for dizziness, tingling, focal weakness, weakness and headaches.  Endo/Heme/Allergies:  Does not bruise/bleed easily.  Psychiatric/Behavioral:  Negative for depression. The patient is not nervous/anxious and does not have insomnia.      MEDICAL HISTORY:  Past Medical History:  Diagnosis Date   Acute renal failure    Anxiety    Bladder outlet obstruction    CLL (chronic lymphocytic leukemia) (HCC)    Elevated PSA    Foreign body of bladder    MS (multiple sclerosis)    Pre-diabetes    Prostate cancer (HCC) 2013   UTI (urinary tract infection)     SURGICAL HISTORY: Past Surgical History:  Procedure Laterality Date   CYSTOSCOPY N/A 12/16/2021   Procedure: CYSTOSCOPY WITH REMOVAL OF FOREIGN BODY;  Surgeon: Penne Knee, MD;  Location: [facility] ORS;  Service: Urology;  Laterality: N/A;   HOLEP-LASER ENUCLEATION OF THE PROSTATE WITH MORCELLATION N/A 03/11/2021   Procedure: HOLEP-LASER ENUCLEATION OF THE PROSTATE WITH MORCELLATION;  Surgeon: Penne Knee, MD;  Location: [facility] OR[facility];  Service: Urology;  Laterality: N/A;   KNEE SURGERY  2002   meniscus  TRANSURETHRAL RESECTION OF PROSTATE N/A 12/16/2021   Procedure: TRANSURETHRAL RESECTION OF THE PROSTATE (TURP);  Surgeon: Penne Knee, MD;  Location: [facility] ORS;  Service: Urology;  Laterality: N/A;    SOCIAL HISTORY: Social History   Socioeconomic History   Marital status: Single     Spouse name: Not on file   Number of children: 0   Years of education: College   Highest education level: Not on file  Occupational History   Occupation: Research Officer, [employer])  Tobacco Use   Smoking status: Never    Passive exposure: Never   Smokeless tobacco: Never  Vaping Use   Vaping status: Never Used  Substance and Sexual Activity   Alcohol use: No    Comment: Former alcohol consumption   Drug use: No   Sexual activity: Not Currently  Other Topics Concern   Not on file  Social History Narrative   Lives alone   Caffeine use: Diet coke/pepsi/Mt Dew   Right handed       Denies history of smoking.  Denies any alcohol; lives in [place].    Social Drivers of Health   Financial Resource Strain: Low Risk  (08/27/2022)   Received from [facility]   Overall Financial Resource Strain (CARDIA)    Difficulty of Paying Living Expenses: Not hard at all  Food Insecurity: No Food Insecurity (08/27/2022)   Received from [facility]   Hunger Vital Sign    Within the past 12 months, you worried that your food would run out before you got the money to buy more.: Never true    Within the past 12 months, the food you bought just didn't last and you didn't have money to get more.: Never true  Transportation Needs: No Transportation Needs (08/27/2022)   Received from [facility] - Transportation    In the past 12 months, has lack of transportation kept you from medical appointments or from getting medications?: No    Lack of Transportation (Non-Medical): No  Physical Activity: Not on file  Stress: Not on file  Social Connections: Not on file  Intimate Partner Violence: Not on file    FAMILY HISTORY: Family History  Problem Relation Age of Onset   Cancer Sister        breast CA     ALLERGIES:  is allergic to gazyva  [obinutuzumab ].  MEDICATIONS:  Current Outpatient Medications  Medication Sig Dispense Refill    acyclovir  (ZOVIRAX ) 400 MG tablet Take 1 tablet by mouth twice daily 60 tablet 0   allopurinol  (ZYLOPRIM ) 300 MG tablet Take 1 tablet by mouth twice daily 60 tablet 0   diphenhydrAMINE  (BENADRYL ) 25 mg capsule Take 25 mg by mouth every 6 (six) hours as needed for itching.     rosuvastatin  (CRESTOR ) 5 MG tablet Take 1 tablet by mouth at bedtime.     sulfamethoxazole -trimethoprim  (BACTRIM  DS) 800-160 MG tablet TAKE 1 TABLET BY MOUTH ON MONDAY , WEDNESDAY AND FRIDAY 30 tablet 0   ondansetron  (ZOFRAN ) 8 MG tablet One pill every 8 hours as needed for nausea/vomitting. (Patient not taking: Reported on 11/16/2023) 40 tablet 1   prochlorperazine  (COMPAZINE ) 10 MG tablet Take 1 tablet (10 mg total) by mouth every 6 (six) hours as needed for nausea or vomiting. (Patient not taking: Reported on 11/16/2023) 40 tablet 1   venetoclax  (VENCLEXTA ) 100 MG tablet Take 3 tablets (300 mg total) by mouth daily. Tablets should be swallowed whole with a meal and  a full glass of water. (Patient not taking: Reported on 11/16/2023) 84 tablet 0   No current facility-administered medications for this visit.    PHYSICAL EXAMINATION:  Vitals:   11/16/23 1524  BP: 114/82  Pulse: 75  Resp: 18  Temp: 97.6 F (36.4 C)  SpO2: 98%     Filed Weights   11/16/23 1524  Weight: 225 lb 8 oz (102.3 kg)    Physical Exam Vitals and nursing note reviewed.  HENT:     Head: Normocephalic and atraumatic.     Mouth/Throat:     Pharynx: Oropharynx is clear.  Eyes:     Extraocular Movements: Extraocular movements intact.     Pupils: Pupils are equal, round, and reactive to light.  Cardiovascular:     Rate and Rhythm: Normal rate and regular rhythm.  Pulmonary:     Comments: Decreased breath sounds bilaterally.  Abdominal:     Palpations: Abdomen is soft.  Musculoskeletal:        General: Normal range of motion.     Cervical back: Normal range of motion.  Skin:    General: Skin is warm.  Neurological:     General: No  focal deficit present.     Mental Status: He is alert and oriented to person, place, and time.  Psychiatric:        Behavior: Behavior normal.        Judgment: Judgment normal.     LABORATORY DATA:  I have reviewed the data as listed Lab Results  Component Value Date   WBC 3.7 (L) 11/16/2023   HGB 15.1 11/16/2023   HCT 46.1 11/16/2023   MCV 88.0 11/16/2023   PLT 192 11/16/2023   Recent Labs    08/17/23 1433 09/14/23 1520 11/16/23 1504  NA 139 137 137  K 3.8 3.9 3.7  CL 105 102 102  CO2 27 25 26   GLUCOSE 97 95 92  BUN 13 12 16   CREATININE 0.92 0.94 0.82  CALCIUM  9.3 9.4 9.1  GFRNONAA >60 >60 >60  PROT 7.1 7.7 7.2  ALBUMIN 4.3 4.7 4.5  AST 23 23 25   ALT 20 17 20   ALKPHOS 88 86 86  BILITOT 1.1 1.1 1.2    RADIOGRAPHIC STUDIES: I have personally reviewed the radiological images as listed and agreed with the findings in the report. No results found.     No problem-specific Assessment & Plan notes found for this encounter.  All questions were answered. The patient knows to call the clinic with any problems, questions or concerns.     Cindy JONELLE Joe, MD 11/16/2023 3:35 PM

## 2023-11-16 NOTE — Assessment & Plan Note (Signed)
#  CLL-CD38 positive stage III [retroperitoneal lymph nodes]-Asymptomatic. IGVH- UNMUTATED; JULY  FISH panel- 12 trisomy-   DEC 18th, 2024-  up to 17 mm- Stable lymphadenopathy involving the chest, abdomen and pelvis. enlargement of the spleen. Currently fixed duration therapy- Gazyva - started Venatoclax with cycle #2]. S/P Gazyva  cycle #6. JULY 7th, 2025- 1. Resolution of axillary mediastinal lymphadenopathy; No new enlarged lymph nodes chest;  Marked interval improvement in splenomegaly;  No evidence of lymphoma in the abdomen pelvis; Resolution of periaortic and iliac lymphadenopathy.  # Labs-CBC/chemistries- ANC 1.5 were reviewed with the patient. -CONTINUE  venetoclax  300mg /day] Discussed with pharmacy. Discussed with the patient will be on venetoclax  for total of 1 year [last cycle with JAN 2026].   #History of urinary retention /prostatomegaly /prostate cancer - on [facility]]- AUG 2025  MRI  with Urology- on surveillance.   # CKD stage II-III- stable.      # MS clinically - Stable.     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  # ID prophylaxis: continue Bactrim  DS; and Acyclovir -   stable.  # ACP [05/13/2023] Full code.  # Vaccination: flu shot- COVID; Pneumonia shots- recommended.   # IV access: PIV   PS # DISPOSITION:   # flu shot today  # follow up in 2 months-- MD; labs- cbc/cmp; [facility]-  Dr.B

## 2023-11-17 ENCOUNTER — Encounter: Payer: Self-pay | Admitting: Internal Medicine

## 2023-12-01 ENCOUNTER — Other Ambulatory Visit: Payer: Self-pay

## 2023-12-04 ENCOUNTER — Other Ambulatory Visit: Payer: Self-pay

## 2023-12-04 NOTE — Progress Notes (Signed)
 Specialty Pharmacy Refill Coordination Note  Ralph [name] is a 58 y.o. male contacted today regarding refills of specialty medication(s) Venetoclax  (VENCLEXTA )   Patient requested [name] at [facility] at [place] date: 12/08/23   Medication will be filled on: 12/07/23

## 2023-12-07 ENCOUNTER — Other Ambulatory Visit: Payer: Self-pay

## 2023-12-08 ENCOUNTER — Other Ambulatory Visit (HOSPITAL_COMMUNITY): Payer: Self-pay

## 2023-12-11 ENCOUNTER — Other Ambulatory Visit: Payer: Self-pay | Admitting: Internal Medicine

## 2023-12-30 ENCOUNTER — Other Ambulatory Visit (HOSPITAL_COMMUNITY): Payer: Self-pay

## 2024-01-01 ENCOUNTER — Other Ambulatory Visit: Payer: Self-pay

## 2024-01-01 NOTE — Progress Notes (Signed)
 Specialty Pharmacy Refill Coordination Note  Ralph [name] is a 58 y.o. male contacted today regarding refills of specialty medication(s) Venetoclax  (VENCLEXTA )   Patient requested [name] at [facility] at [address] date: 01/02/24   Medication will be filled on: 01/01/24

## 2024-01-04 ENCOUNTER — Other Ambulatory Visit: Payer: Self-pay | Admitting: Internal Medicine

## 2024-01-05 ENCOUNTER — Other Ambulatory Visit: Payer: Self-pay

## 2024-01-05 NOTE — Progress Notes (Signed)
 Specialty Pharmacy Ongoing Clinical Assessment Note  Ralph [name] is a 58 y.o. male who is being followed by the specialty pharmacy service for RxSp Oncology   Patient's specialty medication(s) reviewed today: Venetoclax  (VENCLEXTA )   Missed doses in the last 4 weeks: 0   Patient/Caregiver did not have any additional questions or concerns.   Therapeutic benefit summary: Patient is achieving benefit   Adverse events/side effects summary: No adverse events/side effects   Patient's therapy is appropriate to: Continue    Goals Addressed             This Visit's Progress    Achieve or maintain remission   On track    Patient is on track. Patient will maintain adherence and be monitored by provider to determine if a change in treatment plan is warranted.           Follow up: 3 months  Lewis Grivas M Jimmye Wisnieski Specialty Pharmacist

## 2024-01-12 ENCOUNTER — Other Ambulatory Visit: Payer: Self-pay | Admitting: Internal Medicine

## 2024-01-13 ENCOUNTER — Encounter: Payer: Self-pay | Admitting: Internal Medicine

## 2024-01-18 ENCOUNTER — Encounter: Payer: Self-pay | Admitting: Internal Medicine

## 2024-01-18 ENCOUNTER — Inpatient Hospital Stay: Attending: Internal Medicine

## 2024-01-18 ENCOUNTER — Inpatient Hospital Stay: Admitting: Internal Medicine

## 2024-01-18 VITALS — BP 119/79 | HR 81 | Temp 97.6°F | Resp 20 | Ht 72.0 in | Wt 230.8 lb

## 2024-01-18 DIAGNOSIS — C9110 Chronic lymphocytic leukemia of B-cell type not having achieved remission: Secondary | ICD-10-CM | POA: Diagnosis not present

## 2024-01-18 DIAGNOSIS — G35D Multiple sclerosis, unspecified: Secondary | ICD-10-CM | POA: Insufficient documentation

## 2024-01-18 DIAGNOSIS — Z7969 Long term (current) use of other immunomodulators and immunosuppressants: Secondary | ICD-10-CM | POA: Diagnosis not present

## 2024-01-18 DIAGNOSIS — N183 Chronic kidney disease, stage 3 unspecified: Secondary | ICD-10-CM | POA: Insufficient documentation

## 2024-01-18 DIAGNOSIS — Z79624 Long term (current) use of inhibitors of nucleotide synthesis: Secondary | ICD-10-CM | POA: Insufficient documentation

## 2024-01-18 DIAGNOSIS — Z79899 Other long term (current) drug therapy: Secondary | ICD-10-CM | POA: Insufficient documentation

## 2024-01-18 LAB — CBC WITH DIFFERENTIAL (CANCER CENTER ONLY)
Abs Immature Granulocytes: 0.02 K/uL (ref 0.00–0.07)
Basophils Absolute: 0 K/uL (ref 0.0–0.1)
Basophils Relative: 0 %
Eosinophils Absolute: 0 K/uL (ref 0.0–0.5)
Eosinophils Relative: 0 %
HCT: 45.1 % (ref 39.0–52.0)
Hemoglobin: 15 g/dL (ref 13.0–17.0)
Immature Granulocytes: 1 %
Lymphocytes Relative: 45 %
Lymphs Abs: 1.8 K/uL (ref 0.7–4.0)
MCH: 30.2 pg (ref 26.0–34.0)
MCHC: 33.3 g/dL (ref 30.0–36.0)
MCV: 90.9 fL (ref 80.0–100.0)
Monocytes Absolute: 0.4 K/uL (ref 0.1–1.0)
Monocytes Relative: 11 %
Neutro Abs: 1.7 K/uL (ref 1.7–7.7)
Neutrophils Relative %: 43 %
Platelet Count: 189 K/uL (ref 150–400)
RBC: 4.96 MIL/uL (ref 4.22–5.81)
RDW: 15.1 % (ref 11.5–15.5)
WBC Count: 3.9 K/uL — ABNORMAL LOW (ref 4.0–10.5)
nRBC: 0 % (ref 0.0–0.2)

## 2024-01-18 LAB — CMP (CANCER CENTER ONLY)
ALT: 21 U/L (ref 0–44)
AST: 26 U/L (ref 15–41)
Albumin: 4.7 g/dL (ref 3.5–5.0)
Alkaline Phosphatase: 81 U/L (ref 38–126)
Anion gap: 11 (ref 5–15)
BUN: 18 mg/dL (ref 6–20)
CO2: 26 mmol/L (ref 22–32)
Calcium: 10.1 mg/dL (ref 8.9–10.3)
Chloride: 106 mmol/L (ref 98–111)
Creatinine: 1.02 mg/dL (ref 0.61–1.24)
GFR, Estimated: >60 mL/min
Glucose, Bld: 92 mg/dL (ref 70–99)
Potassium: 4.1 mmol/L (ref 3.5–5.1)
Sodium: 143 mmol/L (ref 135–145)
Total Bilirubin: 0.8 mg/dL (ref 0.0–1.2)
Total Protein: 7.2 g/dL (ref 6.5–8.1)

## 2024-01-18 LAB — LACTATE DEHYDROGENASE: LDH: 179 U/L (ref 105–235)

## 2024-01-18 MED ORDER — ALLOPURINOL 100 MG PO TABS: 100.0000 mg | ORAL_TABLET | Freq: Two times a day (BID) | ORAL | 6 refills | Status: AC

## 2024-01-18 NOTE — Progress Notes (Signed)
 Patient has no concerns

## 2024-01-18 NOTE — Progress Notes (Signed)
 [name] CONSULT NOTE  Patient Care Team: [name], MD as PCP - General (Family Medicine) [name], MD as Consulting Physician (Oncology)  CHIEF COMPLAINTS/PURPOSE OF CONSULTATION: CLL  Oncology History Overview Note  Chronic lymphocytic leukemia (CLL), positive for CD20, CD22, CD19 and  CD38,  see comment.   ANNOTATION COMMENT IMP Comment VC   Comment: (NOTE)  Expression of CD38 on >30% of the clonal B-cells is reported to be an  unfavorable prognostic factor in CLL.     # CLL stage I- [lymphocytosis/retroperitoneal lymphadenopathy up to 2.5 cm-incidental CT protocol- 2023];   APRIL 2024- consistent with trisomy 12. Results for  CCND1/IGH, ATM, 13q and TP53 were normal.   # JAN 14th, 2024- Gazyva +  [ started Venatoclax with cycle #2]  with cycle # 4- Re-start at venetoclax  300mg /day [ANC 0.6- late APRIL 2025]  #GEN 2023-bilateral severe hydronephrosis-bladder outlet obstruction/status post catheter [Dr.Brandon]  #History of prostate cancer under surveillance [[facility]]   CLL (chronic lymphocytic leukemia) (HCC)  02/11/2021 Initial Diagnosis   CLL (chronic lymphocytic leukemia) (HCC)   01/20/2023 -  Chemotherapy   Patient is on Treatment Plan : LYMPHOMA CLL/SLL Venetoclax  + Obinutuzumab  q28d     ISTORY OF PRESENTING ILLNESS: Alone.  Ambulating independently.  Ralph [name] 59 y.o.  male history of multiple sclerosis-not on active therapy; prostate cancer on surveillance; CLL currently status post 6 cycles of Gazyva  [ started Venatoclax with cycle #2] is here for a follow-up.  Discussed the use of AI scribe software for clinical note transcription with the patient, who gave verbal consent to proceed.  History of Present Illness   Ralph [name] is a 59 year old male with chronic lymphocytic leukemia (CLL) who presents for routine hematology/oncology follow-up.  He is currently completing a one-year course of venetoclax , initiated in  January 2025, and has approximately 10-15 days of medication remaining. He is asymptomatic, reports feeling well, and expresses high satisfaction with disease control. There are no new symptoms or complications. A CT scan was performed in December 2025, and another scan is scheduled for June 2026. He understands the disease is not curable.  He continues prophylactic acyclovir  and Bactrim  as part of his supportive regimen, with Bactrim  taken on Monday, Wednesday, and Friday. He has a prior diagnosis of gout and is currently taking allopurinol  300 mg daily, which was maintained during leukemia treatment. He received a seasonal influenza vaccine and has not experienced any significant illness or complications since then.  He maintains a positive outlook and describes his recent holidays as restful.      Review of Systems  Constitutional:  Negative for chills, diaphoresis, fever and malaise/fatigue.  HENT:  Negative for nosebleeds and sore throat.   Eyes:  Negative for double vision.  Respiratory:  Negative for cough, hemoptysis, sputum production, shortness of breath and wheezing.   Cardiovascular:  Negative for chest pain, palpitations, orthopnea and leg swelling.  Gastrointestinal:  Negative for abdominal pain, blood in stool, constipation, diarrhea, heartburn, melena, nausea and vomiting.  Genitourinary:  Negative for dysuria, frequency and urgency.  Musculoskeletal:  Negative for back pain and joint pain.  Skin: Negative.  Negative for itching and rash.  Neurological:  Negative for dizziness, tingling, focal weakness, weakness and headaches.  Endo/Heme/Allergies:  Does not bruise/bleed easily.  Psychiatric/Behavioral:  Negative for depression. The patient is not nervous/anxious and does not have insomnia.      MEDICAL HISTORY:  Past Medical History:  Diagnosis Date   Acute renal failure  Anxiety    Bladder outlet obstruction    CLL (chronic lymphocytic leukemia) (HCC)    Elevated PSA     Foreign body of bladder    MS (multiple sclerosis)    Pre-diabetes    Prostate cancer (HCC) 2013   UTI (urinary tract infection)     SURGICAL HISTORY: Past Surgical History:  Procedure Laterality Date   CYSTOSCOPY N/A 12/16/2021   Procedure: CYSTOSCOPY WITH REMOVAL OF FOREIGN BODY;  Surgeon: Penne Knee, MD;  Location: [facility] ORS;  Service: Urology;  Laterality: N/A;   HOLEP-LASER ENUCLEATION OF THE PROSTATE WITH MORCELLATION N/A 03/11/2021   Procedure: HOLEP-LASER ENUCLEATION OF THE PROSTATE WITH MORCELLATION;  Surgeon: Penne Knee, MD;  Location: [facility] ORS;  Service: Urology;  Laterality: N/A;   KNEE SURGERY  2002   meniscus   TRANSURETHRAL RESECTION OF PROSTATE N/A 12/16/2021   Procedure: TRANSURETHRAL RESECTION OF THE PROSTATE (TURP);  Surgeon: Penne Knee, MD;  Location: [facility] ORS;  Service: Urology;  Laterality: N/A;    SOCIAL HISTORY: Social History   Socioeconomic History   Marital status: Single    Spouse name: Not on file   Number of children: 0   Years of education: College   Highest education level: Not on file  Occupational History   Occupation: Research Officer, [employer])  Tobacco Use   Smoking status: Never    Passive exposure: Never   Smokeless tobacco: Never  Vaping Use   Vaping status: Never Used  Substance and Sexual Activity   Alcohol use: No    Comment: Former alcohol consumption   Drug use: No   Sexual activity: Not Currently  Other Topics Concern   Not on file  Social History Narrative   Lives alone   Caffeine use: Diet coke/pepsi/Mt Dew   Right handed       Denies history of smoking.  Denies any alcohol; lives in [place].    Social Drivers of Health   Tobacco Use: Low Risk (01/18/2024)   Patient History    Smoking Tobacco Use: Never    Smokeless Tobacco Use: Never    Passive Exposure: Never  Financial Resource Strain: Low Risk  (08/27/2022)   Received from [facility]   Overall Financial Resource Strain  (CARDIA)    Difficulty of Paying Living Expenses: Not hard at all  Food Insecurity: No Food Insecurity (08/27/2022)   Received from [facility]   Epic    Within the past 12 months, you worried that your food would run out before you got the money to buy more.: Never true    Within the past 12 months, the food you bought just didn't last and you didn't have money to get more.: Never true  Transportation Needs: No Transportation Needs (08/27/2022)   Received from [facility] - Transportation    In the past 12 months, has lack of transportation kept you from medical appointments or from getting medications?: No    Lack of Transportation (Non-Medical): No  Physical Activity: Not on file  Stress: Not on file  Social Connections: Not on file  Intimate Partner Violence: Not on file  Depression (PHQ2-9): Low Risk (09/14/2023)   Depression (PHQ2-9)    PHQ-2 Score: 0  Alcohol Screen: Not on file  Housing: Low Risk  (06/16/2023)   Received from [facility]   Epic    In the last 12 months, was there a time when you were not able to pay the  mortgage or rent on time?: No    In the past 12 months, how many times have you moved where you were living?: 0    At any time in the past 12 months, were you homeless or living in a shelter (including now)?: No  Utilities: Not At Risk (08/27/2022)   Received from [facility] Utilities    Threatened with loss of utilities: No  Health Literacy: Not on file    FAMILY HISTORY: Family History  Problem Relation Age of Onset   Cancer Sister        breast CA     ALLERGIES:  is allergic to gazyva  [obinutuzumab ].  MEDICATIONS:  Current Outpatient Medications  Medication Sig Dispense Refill   acyclovir  (ZOVIRAX ) 400 MG tablet Take 1 tablet by mouth twice daily 60 tablet 0   diphenhydrAMINE  (BENADRYL ) 25 mg capsule Take 25 mg by mouth every 6 (six) hours as needed for itching.      rosuvastatin  (CRESTOR ) 5 MG tablet Take 1 tablet by mouth at bedtime.     sulfamethoxazole -trimethoprim  (BACTRIM  DS) 800-160 MG tablet TAKE 1 TABLET BY MOUTH ONCE DAILY ON  MONDAY,  WEDNESDAY  AND  FRIDAY 24 tablet 0   venetoclax  (VENCLEXTA ) 100 MG tablet Take 3 tablets (300 mg total) by mouth daily. Tablets should be swallowed whole with a meal and a full glass of water. 84 tablet 1   allopurinol  (ZYLOPRIM ) 100 MG tablet Take 1 tablet (100 mg total) by mouth 2 (two) times daily. 60 tablet 6   ondansetron  (ZOFRAN ) 8 MG tablet One pill every 8 hours as needed for nausea/vomitting. (Patient not taking: Reported on 01/18/2024) 40 tablet 1   prochlorperazine  (COMPAZINE ) 10 MG tablet Take 1 tablet (10 mg total) by mouth every 6 (six) hours as needed for nausea or vomiting. (Patient not taking: Reported on 01/18/2024) 40 tablet 1   No current facility-administered medications for this visit.    PHYSICAL EXAMINATION:  Vitals:   01/18/24 1434  BP: 119/79  Pulse: 81  Resp: 20  Temp: 97.6 F (36.4 C)  SpO2: 100%     Filed Weights   01/18/24 1434  Weight: 230 lb 12.8 oz (104.7 kg)    Physical Exam Vitals and nursing note reviewed.  HENT:     Head: Normocephalic and atraumatic.     Mouth/Throat:     Pharynx: Oropharynx is clear.  Eyes:     Extraocular Movements: Extraocular movements intact.     Pupils: Pupils are equal, round, and reactive to light.  Cardiovascular:     Rate and Rhythm: Normal rate and regular rhythm.  Pulmonary:     Comments: Decreased breath sounds bilaterally.  Abdominal:     Palpations: Abdomen is soft.  Musculoskeletal:        General: Normal range of motion.     Cervical back: Normal range of motion.  Skin:    General: Skin is warm.  Neurological:     General: No focal deficit present.     Mental Status: He is alert and oriented to person, place, and time.  Psychiatric:        Behavior: Behavior normal.        Judgment: Judgment normal.      LABORATORY DATA:  I have reviewed the data as listed Lab Results  Component Value Date   WBC 3.9 (L) 01/18/2024   HGB 15.0 01/18/2024   HCT 45.1 01/18/2024   MCV 90.9 01/18/2024   PLT  189 01/18/2024   Recent Labs    09/14/23 1520 11/16/23 1504 01/18/24 1447  NA 137 137 143  K 3.9 3.7 4.1  CL 102 102 106  CO2 25 26 26   GLUCOSE 95 92 92  BUN 12 16 18   CREATININE 0.94 0.82 1.02  CALCIUM  9.4 9.1 10.1  GFRNONAA >60 >60 >60  PROT 7.7 7.2 7.2  ALBUMIN 4.7 4.5 4.7  AST 23 25 26   ALT 17 20 21   ALKPHOS 86 86 81  BILITOT 1.1 1.2 0.8    RADIOGRAPHIC STUDIES: I have personally reviewed the radiological images as listed and agreed with the findings in the report. No results found.     CLL (chronic lymphocytic leukemia) (HCC) #CLL-CD38 positive stage III [retroperitoneal lymph nodes]-Asymptomatic. I[facility]- UNMUTATED; JULY  FISH panel- 12 trisomy-   DEC 18th, 2024-  up to 17 mm- Stable lymphadenopathy involving the chest, abdomen and pelvis. enlargement of the spleen. Currently fixed duration therapy- Gazyva - started Venatoclax with cycle #2]. S/P Gazyva  cycle #6. JULY 7th, 2025- 1. Resolution of axillary mediastinal lymphadenopathy; No new enlarged lymph nodes chest;  Marked interval improvement in splenomegaly;  No evidence of lymphoma in the abdomen pelvis; Resolution of periaortic and iliac lymphadenopathy.  # Labs-CBC/chemistries- ANC 1.5 were reviewed with the patient. -CONTINUE  venetoclax  300mg /day] Discussed with pharmacy. Discussed with the patient will be on venetoclax  for total of 1 year [last cycle with JAN 2026]. Has 15 more days of pill now. Will review the CT scan in 6 months.   #History of urinary retention /prostatomegaly /prostate cancer - on [facility]]- AUG 2025  MRI  with Urology- on surveillance.   # CKD stage II-III- stable.      # MS clinically - Stable.     # Secondary cancers: colo-[march 2024; [place]]; recommend surveillaince  with dermatology-   stable  # Hx of Gout Elevated uric acid 9.5-second of CLL-on allopurinol  decrease the dose to 100 mg once BID- [prior dosing]  # ID prophylaxis: continue Bactrim  DS; and Acyclovir ; x6 months-.  In June 2026-  stable.  # ACP [05/13/2023] Full code.  # Vaccination: flu shot- COVID; Pneumonia shots- recommended.   # IV access: PIV   PS # DISPOSITION:   # follow up in 6 months-- MD; labs- cbc/cmp; [facility]; CT CAP-  Dr.B  All questions were answered. The patient knows to call the clinic with any problems, questions or concerns.     Cindy JONELLE Joe, MD 01/18/2024 3:56 PM

## 2024-01-18 NOTE — Assessment & Plan Note (Addendum)
#  CLL-CD38 positive stage III [retroperitoneal lymph nodes]-Asymptomatic. IGVH- UNMUTATED; JULY  FISH panel- 12 trisomy-   DEC 18th, 2024-  up to 17 mm- Stable lymphadenopathy involving the chest, abdomen and pelvis. enlargement of the spleen. Currently fixed duration therapy- Gazyva - started Venatoclax with cycle #2]. S/P Gazyva  cycle #6. JULY 7th, 2025- 1. Resolution of axillary mediastinal lymphadenopathy; No new enlarged lymph nodes chest;  Marked interval improvement in splenomegaly;  No evidence of lymphoma in the abdomen pelvis; Resolution of periaortic and iliac lymphadenopathy.  # Labs-CBC/chemistries- ANC 1.5 were reviewed with the patient. -CONTINUE  venetoclax  300mg /day] Discussed with pharmacy. Discussed with the patient will be on venetoclax  for total of 1 year [last cycle with JAN 2026]. Has 15 more days of pill now. Will review the CT scan in 6 months.   #History of urinary retention /prostatomegaly /prostate cancer - on [facility]]- AUG 2025  MRI  with Urology- on surveillance.   # CKD stage II-III- stable.      # MS clinically - Stable.     # Secondary cancers: colo-[march 2024; [place]]; recommend surveillaince with dermatology-   stable  # Hx of Gout Elevated uric acid 9.5-second of CLL-on allopurinol  decrease the dose to 100 mg once BID- [prior dosing]  # ID prophylaxis: continue Bactrim  DS; and Acyclovir ; x6 months-.  In June 2026-  stable.  # ACP [05/13/2023] Full code.  # Vaccination: flu shot- COVID; Pneumonia shots- recommended.   # IV access: PIV   PS # DISPOSITION:   # follow up in 6 months-- MD; labs- cbc/cmp; [facility]; CT CAP-  Dr.B

## 2024-01-19 ENCOUNTER — Telehealth: Payer: Self-pay | Admitting: Internal Medicine

## 2024-01-19 ENCOUNTER — Encounter: Payer: Self-pay | Admitting: Internal Medicine

## 2024-01-19 NOTE — Telephone Encounter (Signed)
 Called pt to sched CT - pt confirmed date/time/location - pt requested appt reminder via mychart and mail - LH

## 2024-01-22 ENCOUNTER — Other Ambulatory Visit (HOSPITAL_COMMUNITY): Payer: Self-pay

## 2024-01-22 ENCOUNTER — Other Ambulatory Visit: Payer: Self-pay | Admitting: Internal Medicine

## 2024-01-22 ENCOUNTER — Other Ambulatory Visit: Payer: Self-pay

## 2024-01-22 DIAGNOSIS — C9110 Chronic lymphocytic leukemia of B-cell type not having achieved remission: Secondary | ICD-10-CM

## 2024-01-22 MED ORDER — VENETOCLAX 100 MG PO TABS
300.0000 mg | ORAL_TABLET | Freq: Every day | ORAL | 1 refills | Status: AC
  Filled 2024-01-22 – 2024-01-25 (×2): qty 84, 28d supply, fill #0

## 2024-01-25 ENCOUNTER — Other Ambulatory Visit: Payer: Self-pay

## 2024-01-25 ENCOUNTER — Other Ambulatory Visit (HOSPITAL_COMMUNITY): Payer: Self-pay

## 2024-01-27 ENCOUNTER — Other Ambulatory Visit: Payer: Self-pay

## 2024-01-29 ENCOUNTER — Other Ambulatory Visit: Payer: Self-pay

## 2024-01-29 NOTE — Progress Notes (Signed)
 Disenrolled- spoke with patient and he will stop Venclexta  after he finishes his supply on hand. Per 1.12.26 office visit notes patient completes 1 year of venclexta  therapy Jan. 2026 and will be monitored onco/urology.

## 2024-02-09 ENCOUNTER — Other Ambulatory Visit: Payer: Self-pay | Admitting: Internal Medicine

## 2024-02-10 ENCOUNTER — Other Ambulatory Visit: Payer: Self-pay | Admitting: Internal Medicine

## 2024-03-04 ENCOUNTER — Other Ambulatory Visit

## 2024-07-18 ENCOUNTER — Other Ambulatory Visit

## 2024-07-25 ENCOUNTER — Inpatient Hospital Stay: Admitting: Internal Medicine

## 2024-07-25 ENCOUNTER — Inpatient Hospital Stay

## 2024-09-09 ENCOUNTER — Ambulatory Visit: Admitting: Urology

## 2024-11-17 ENCOUNTER — Ambulatory Visit: Payer: Federal, State, Local not specified - PPO | Admitting: Dermatology
